# Patient Record
Sex: Male | Born: 1965 | Race: White | Hispanic: No | Smoking: Current every day smoker
Health system: Southern US, Community
[De-identification: ages and names within clinical notes are randomized; demographics above are authoritative.]

## PROBLEM LIST (undated history)

## (undated) DIAGNOSIS — I2510 Atherosclerotic heart disease of native coronary artery without angina pectoris: Secondary | ICD-10-CM

## (undated) DIAGNOSIS — M1990 Unspecified osteoarthritis, unspecified site: Secondary | ICD-10-CM

## (undated) DIAGNOSIS — F411 Generalized anxiety disorder: Secondary | ICD-10-CM

## (undated) DIAGNOSIS — N529 Male erectile dysfunction, unspecified: Secondary | ICD-10-CM

## (undated) DIAGNOSIS — R1013 Epigastric pain: Secondary | ICD-10-CM

## (undated) DIAGNOSIS — J209 Acute bronchitis, unspecified: Secondary | ICD-10-CM

## (undated) DIAGNOSIS — K219 Gastro-esophageal reflux disease without esophagitis: Secondary | ICD-10-CM

## (undated) DIAGNOSIS — E785 Hyperlipidemia, unspecified: Secondary | ICD-10-CM

## (undated) HISTORY — DX: Gastro-esophageal reflux disease without esophagitis: K21.9

## (undated) HISTORY — DX: Generalized anxiety disorder: F41.1

## (undated) HISTORY — DX: Hyperlipidemia, unspecified: E78.5

## (undated) HISTORY — DX: Unspecified osteoarthritis, unspecified site: M19.90

## (undated) HISTORY — DX: Epigastric pain: R10.13

## (undated) HISTORY — DX: Male erectile dysfunction, unspecified: N52.9

## (undated) HISTORY — DX: Acute bronchitis, unspecified: J20.9

## (undated) HISTORY — PX: COLONOSCOPY: SHX174

---

## 1987-01-02 HISTORY — PX: WISDOM TOOTH EXTRACTION: SHX21

## 1998-09-09 ENCOUNTER — Encounter: Payer: Self-pay | Admitting: Emergency Medicine

## 1998-09-09 ENCOUNTER — Emergency Department (HOSPITAL_COMMUNITY): Admission: EM | Admit: 1998-09-09 | Discharge: 1998-09-09 | Payer: Self-pay | Admitting: Emergency Medicine

## 1999-06-21 ENCOUNTER — Emergency Department (HOSPITAL_COMMUNITY): Admission: EM | Admit: 1999-06-21 | Discharge: 1999-06-21 | Payer: Self-pay

## 1999-11-11 ENCOUNTER — Emergency Department (HOSPITAL_COMMUNITY): Admission: EM | Admit: 1999-11-11 | Discharge: 1999-11-11 | Payer: Self-pay | Admitting: *Deleted

## 2000-06-19 ENCOUNTER — Emergency Department (HOSPITAL_COMMUNITY): Admission: EM | Admit: 2000-06-19 | Discharge: 2000-06-19 | Payer: Self-pay | Admitting: Emergency Medicine

## 2000-07-19 ENCOUNTER — Other Ambulatory Visit: Admission: RE | Admit: 2000-07-19 | Discharge: 2000-07-19 | Payer: Self-pay | Admitting: *Deleted

## 2000-09-13 ENCOUNTER — Emergency Department (HOSPITAL_COMMUNITY): Admission: EM | Admit: 2000-09-13 | Discharge: 2000-09-13 | Payer: Self-pay | Admitting: Emergency Medicine

## 2000-09-13 ENCOUNTER — Encounter: Payer: Self-pay | Admitting: Emergency Medicine

## 2005-03-29 ENCOUNTER — Emergency Department (HOSPITAL_COMMUNITY): Admission: EM | Admit: 2005-03-29 | Discharge: 2005-03-30 | Payer: Self-pay | Admitting: Emergency Medicine

## 2005-04-13 ENCOUNTER — Ambulatory Visit: Payer: Self-pay

## 2005-04-13 ENCOUNTER — Encounter: Payer: Self-pay | Admitting: Internal Medicine

## 2005-12-27 ENCOUNTER — Encounter: Admission: RE | Admit: 2005-12-27 | Discharge: 2005-12-27 | Payer: Self-pay | Admitting: Occupational Medicine

## 2008-11-10 ENCOUNTER — Ambulatory Visit: Payer: Self-pay | Admitting: Family Medicine

## 2008-11-10 DIAGNOSIS — F411 Generalized anxiety disorder: Secondary | ICD-10-CM

## 2008-11-10 DIAGNOSIS — N529 Male erectile dysfunction, unspecified: Secondary | ICD-10-CM

## 2008-11-10 HISTORY — DX: Male erectile dysfunction, unspecified: N52.9

## 2008-11-10 HISTORY — DX: Generalized anxiety disorder: F41.1

## 2008-12-21 ENCOUNTER — Ambulatory Visit: Payer: Self-pay | Admitting: Family Medicine

## 2008-12-21 LAB — CONVERTED CEMR LAB
Bilirubin Urine: NEGATIVE
Blood in Urine, dipstick: NEGATIVE
Glucose, Urine, Semiquant: NEGATIVE
Ketones, urine, test strip: NEGATIVE
Nitrite: NEGATIVE
Protein, U semiquant: NEGATIVE
Specific Gravity, Urine: 1.02
Urobilinogen, UA: 0.2
WBC Urine, dipstick: NEGATIVE
pH: 7

## 2008-12-22 LAB — CONVERTED CEMR LAB
ALT: 37 units/L (ref 0–53)
AST: 30 units/L (ref 0–37)
Albumin: 4.1 g/dL (ref 3.5–5.2)
Alkaline Phosphatase: 61 units/L (ref 39–117)
BUN: 15 mg/dL (ref 6–23)
Basophils Absolute: 0 10*3/uL (ref 0.0–0.1)
Basophils Relative: 0.7 % (ref 0.0–3.0)
Bilirubin, Direct: 0 mg/dL (ref 0.0–0.3)
CO2: 31 meq/L (ref 19–32)
Calcium: 9.4 mg/dL (ref 8.4–10.5)
Chloride: 104 meq/L (ref 96–112)
Cholesterol: 207 mg/dL — ABNORMAL HIGH (ref 0–200)
Creatinine, Ser: 1.2 mg/dL (ref 0.4–1.5)
Direct LDL: 157.6 mg/dL
Eosinophils Absolute: 0.3 10*3/uL (ref 0.0–0.7)
Eosinophils Relative: 5.8 % — ABNORMAL HIGH (ref 0.0–5.0)
GFR calc non Af Amer: 69.72 mL/min (ref >60)
Glucose, Bld: 101 mg/dL — ABNORMAL HIGH (ref 70–99)
HCT: 45.3 % (ref 39.0–52.0)
HDL: 39.4 mg/dL (ref >39.00)
Hemoglobin: 15.2 g/dL (ref 13.0–17.0)
Lymphocytes Relative: 30.7 % (ref 12.0–46.0)
Lymphs Abs: 1.8 10*3/uL (ref 0.7–4.0)
MCHC: 33.6 g/dL (ref 30.0–36.0)
MCV: 90.7 fL (ref 78.0–100.0)
Monocytes Absolute: 0.4 10*3/uL (ref 0.1–1.0)
Monocytes Relative: 7.3 % (ref 3.0–12.0)
Neutro Abs: 3.5 10*3/uL (ref 1.4–7.7)
Neutrophils Relative %: 55.5 % (ref 43.0–77.0)
Platelets: 221 10*3/uL (ref 150.0–400.0)
Potassium: 4.6 meq/L (ref 3.5–5.1)
RBC: 4.99 M/uL (ref 4.22–5.81)
RDW: 11.9 % (ref 11.5–14.6)
Sodium: 142 meq/L (ref 135–145)
TSH: 1.65 microintl units/mL (ref 0.35–5.50)
Total Bilirubin: 0.7 mg/dL (ref 0.3–1.2)
Total CHOL/HDL Ratio: 5
Total Protein: 6.7 g/dL (ref 6.0–8.3)
Triglycerides: 85 mg/dL (ref 0.0–149.0)
VLDL: 17 mg/dL (ref 0.0–40.0)
WBC: 6 10*3/uL (ref 4.5–10.5)

## 2008-12-29 ENCOUNTER — Ambulatory Visit: Payer: Self-pay | Admitting: Family Medicine

## 2008-12-29 DIAGNOSIS — E785 Hyperlipidemia, unspecified: Secondary | ICD-10-CM

## 2008-12-29 HISTORY — DX: Hyperlipidemia, unspecified: E78.5

## 2009-03-31 ENCOUNTER — Ambulatory Visit: Payer: Self-pay | Admitting: Family Medicine

## 2009-03-31 DIAGNOSIS — J209 Acute bronchitis, unspecified: Secondary | ICD-10-CM | POA: Insufficient documentation

## 2009-03-31 HISTORY — DX: Acute bronchitis, unspecified: J20.9

## 2009-08-03 ENCOUNTER — Ambulatory Visit: Payer: Self-pay | Admitting: Family Medicine

## 2009-12-08 ENCOUNTER — Ambulatory Visit: Payer: Self-pay | Admitting: Family Medicine

## 2009-12-08 DIAGNOSIS — R1013 Epigastric pain: Secondary | ICD-10-CM | POA: Insufficient documentation

## 2009-12-08 HISTORY — DX: Epigastric pain: R10.13

## 2009-12-08 LAB — CONVERTED CEMR LAB
Cholesterol, target level: 200 mg/dL
HDL goal, serum: 40 mg/dL
LDL Goal: 130 mg/dL

## 2009-12-09 LAB — CONVERTED CEMR LAB
ALT: 35 units/L (ref 0–53)
AST: 27 units/L (ref 0–37)
Albumin: 4.5 g/dL (ref 3.5–5.2)
Alkaline Phosphatase: 70 units/L (ref 39–117)
Bilirubin, Direct: 0.1 mg/dL (ref 0.0–0.3)
Cholesterol: 176 mg/dL (ref 0–200)
HDL: 36.1 mg/dL — ABNORMAL LOW (ref >39.00)
LDL Cholesterol: 119 mg/dL — ABNORMAL HIGH (ref 0–99)
Total Bilirubin: 0.4 mg/dL (ref 0.3–1.2)
Total CHOL/HDL Ratio: 5
Total Protein: 7.5 g/dL (ref 6.0–8.3)
Triglycerides: 103 mg/dL (ref 0.0–149.0)
VLDL: 20.6 mg/dL (ref 0.0–40.0)

## 2010-02-02 NOTE — Assessment & Plan Note (Signed)
Summary: COUGH//CCM   Vital Signs:  Patient profile:   45 year old male Temp:     98.2 degrees F oral BP sitting:   100 / 80  (left arm) Cuff size:   regular  Vitals Entered By: Sid Falcon LPN (March 31, 2009 11:48 AM) CC: ongoing cough X 3 weeks   History of Present Illness: patient seen with cough for approximately one month. Initially seen through employer. Started off with postnasal drip symptoms and subsequent productive cough. Treated with 10 day course of amoxicillin without improvement. Also tried Claritin. Occasional sore throat. Denies fever. No hemoptysis or dyspnea. No appetite or weight changes. Cough especially bothersome at night and not relieved with over-the-counter medication. Smokes one half pack cigarettes per day. Continued productive cough with green to brown sputum  Preventive Screening-Counseling & Management  Alcohol-Tobacco     Smoking Status: current     Packs/Day: 0.5  Allergies (verified): No Known Drug Allergies  Past History:  Past Medical History: Last updated: 11/10/2008 Chicken pox Anxiety  Social History: Packs/Day:  0.5  Review of Systems      See HPI  Physical Exam  General:  Well-developed,well-nourished,in no acute distress; alert,appropriate and cooperative throughout examination Ears:  External ear exam shows no significant lesions or deformities.  Otoscopic examination reveals clear canals, tympanic membranes are intact bilaterally without bulging, retraction, inflammation or discharge. Hearing is grossly normal bilaterally. Mouth:  Oral mucosa and oropharynx without lesions or exudates.  Teeth in good repair. Neck:  No deformities, masses, or tenderness noted. Lungs:  Normal respiratory effort, chest expands symmetrically. Lungs are clear to auscultation, no crackles or wheezes. Heart:  Normal rate and regular rhythm. S1 and S2 normal without gallop, murmur, click, rub or other extra sounds.   Impression &  Recommendations:  Problem # 1:  ACUTE BRONCHITIS (ICD-466.0) Samples Avelox 400 mg by mouth once daily for 7 days and cough suppressant for as needed use.  CXR if no better 1-2 weeks. Pt encouraged to stop smoking. His updated medication list for this problem includes:    Tussionex Pennkinetic Er 8-10 Mg/60ml Lqcr (Chlorpheniramine-hydrocodone) ..... One tsp by mouth q 12 hours as needed cough  Complete Medication List: 1)  Klonopin 1 Mg Tabs (Clonazepam) .... One tab two times a day 2)  Cialis 20 Mg Tabs (Tadalafil) .... As needed 3)  Simvastatin 40 Mg Tabs (Simvastatin) .... One by mouth once daily 4)  Tussionex Pennkinetic Er 8-10 Mg/76ml Lqcr (Chlorpheniramine-hydrocodone) .... One tsp by mouth q 12 hours as needed cough  Patient Instructions: 1)  start Avelox 400 mg one tablet daily. 2)  Followup within one to 2 weeks if cough not improving Prescriptions: TUSSIONEX PENNKINETIC ER 8-10 MG/5ML LQCR (CHLORPHENIRAMINE-HYDROCODONE) one tsp by mouth q 12 hours as needed cough  #90 ml x 0   Entered and Authorized by:   Evelena Peat MD   Signed by:   Evelena Peat MD on 03/31/2009   Method used:   Print then Give to Patient   RxID:   [id number]

## 2010-02-02 NOTE — Assessment & Plan Note (Signed)
Summary: NEW PT TO LBF/TO ESTABLISH/CJR   Vital Signs:  Patient profile:   45 year old male Height:      72 inches Weight:      196 pounds BMI:     26.68 Temp:     98.5 degrees F oral Pulse rate:   80 / minute Pulse rhythm:   regular Resp:     12 per minute BP sitting:   120 / 80  (left arm) Cuff size:   regular  Vitals Entered By: Sid Falcon LPN (November 10, 2008 1:14 PM) CC: New pt to establish   History of Present Illness: Patient seen to establish care. His history of anxiety disorder and probable panic attacks. This has been controlled with Klonopin 1 mg b.i.d. Question of previous intolerance to serotonin medications. He also takes Cialis for erectile dysfunction. No other chronic medical problems. No prior surgeries. No known allergies.  Family history significant for father having heart disease at age 6 and father with prior stroke. Mother had some sort of brain cancer.  Patient smokes less than one pack of cigarettes per day and desires to try to quit this year. No regular alcohol use.  Preventive Screening-Counseling & Management  Alcohol-Tobacco     Smoking Status: current     Packs/Day: 0.75  Caffeine-Diet-Exercise     Does Patient Exercise: yes  Allergies (verified): No Known Drug Allergies  Past History:  Family History: Last updated: 11/10/2008 Family History CAD  Fa 90 Family History of Stroke  Family History Diabetes  Brain cancer, mother  Social History: Last updated: 11/10/2008 Occupation:  Facilities Maintenance Tech Single Current Smoker Alcohol use-yes Regular exercise-yes  Risk Factors: Exercise: yes (11/10/2008)  Risk Factors: Smoking Status: current (11/10/2008) Packs/Day: 0.75 (11/10/2008)  Past Medical History: Chicken pox Anxiety  Family History: Family History CAD  Fa 68 Family History of Stroke  Family History Diabetes  Brain cancer, mother  Social History: Occupation:  Civil engineer, contracting Single Current Smoker Alcohol use-yes Regular exercise-yes Smoking Status:  current Packs/Day:  0.75 Occupation:  employed Does Patient Exercise:  yes  Review of Systems  The patient denies anorexia, fever, weight loss, weight gain, chest pain, syncope, dyspnea on exertion, peripheral edema, prolonged cough, hemoptysis, and abdominal pain.    Physical Exam  General:  Well-developed,well-nourished,in no acute distress; alert,appropriate and cooperative throughout examination Mouth:  Oral mucosa and oropharynx without lesions or exudates.  Teeth in good repair. Neck:  No deformities, masses, or tenderness noted. Lungs:  Normal respiratory effort, chest expands symmetrically. Lungs are clear to auscultation, no crackles or wheezes. Heart:  Normal rate and regular rhythm. S1 and S2 normal without gallop, murmur, click, rub or other extra sounds.   Impression & Recommendations:  Problem # 1:  ANXIETY (ICD-300.00) Possible history of panic disorder. Refill Klonopin for 6 months. His updated medication list for this problem includes:    Klonopin 1 Mg Tabs (Clonazepam) ..... One tab two times a day  Problem # 2:  IMPOTENCE OF ORGANIC ORIGIN (ICD-607.84) refill Cialis for one year His updated medication list for this problem includes:    Cialis 20 Mg Tabs (Tadalafil) .Marland Kitchen... As needed  Complete Medication List: 1)  Klonopin 1 Mg Tabs (Clonazepam) .... One tab two times a day 2)  Cialis 20 Mg Tabs (Tadalafil) .... As needed  Patient Instructions: 1)  Schedule complete physical at your convenience. Prescriptions: KLONOPIN 1 MG TABS (CLONAZEPAM) one tab two times a day  #60 x 5  Entered and Authorized by:   Evelena Peat MD   Signed by:   Evelena Peat MD on 11/10/2008   Method used:   Print then Give to Patient   RxID:   [id number] CIALIS 20 MG TABS (TADALAFIL) as needed  #7 x 11   Entered and Authorized by:   Evelena Peat MD   Signed by:   Evelena Peat MD on  11/10/2008   Method used:   Print then Give to Patient   RxID:   [id number]   Preventive Care Screening  Last Tetanus Booster:    Date:  10/01/2008    Results:  Historical

## 2010-02-02 NOTE — Assessment & Plan Note (Signed)
Summary:     Vital Signs:  Patient profile:   45 year old male Height:      721 inches Weight:      196 pounds Temp:     98.6 degrees F oral Pulse rate:   80 / minute Pulse rhythm:   regular Resp:     12 per minute BP sitting:   130 / 84  (left arm) Cuff size:   regular  Vitals Entered By: Sid Falcon LPN (December 29, 2008 2:56 PM) CC: CPX, labs printed   History of Present Illness: Patient here for complete physical examination. History of hyperlipidemia which is currently untreated. Has previously been on statin therapy but for some reason was stopped. This was not due to adverse effect from medication. Patient has erectile dysfunction on Cialis. Also takes Klonopin for chronic anxiety.  Family history significant for father having coronary disease age 3. Patient's other risk factors include history of smoking and hyperlipidemia.  Tetanus is up-to-date.  Allergies (verified): No Known Drug Allergies  Past History:  Past Medical History: Last updated: 11/10/2008 Chicken pox Anxiety  Family History: Last updated: 11/10/2008 Family History CAD  Fa 8 Family History of Stroke  Family History Diabetes  Brain cancer, mother  Social History: Last updated: 11/10/2008 Occupation:  Facilities Maintenance Tech Single Current Smoker Alcohol use-yes Regular exercise-yes  Risk Factors: Exercise: yes (11/10/2008)  Risk Factors: Smoking Status: current (11/10/2008) Packs/Day: 0.75 (11/10/2008)  Review of Systems  The patient denies anorexia, fever, weight loss, weight gain, vision loss, decreased hearing, hoarseness, chest pain, syncope, dyspnea on exertion, peripheral edema, prolonged cough, headaches, hemoptysis, abdominal pain, melena, hematochezia, severe indigestion/heartburn, hematuria, incontinence, genital sores, muscle weakness, suspicious skin lesions, transient blindness, difficulty walking, depression, unusual weight change, abnormal bleeding, enlarged  lymph nodes, and testicular masses.    Physical Exam  General:  Well-developed,well-nourished,in no acute distress; alert,appropriate and cooperative throughout examination Head:  Normocephalic and atraumatic without obvious abnormalities. No apparent alopecia or balding. Eyes:  No corneal or conjunctival inflammation noted. EOMI. Perrla. Funduscopic exam benign, without hemorrhages, exudates or papilledema. Vision grossly normal. Ears:  External ear exam shows no significant lesions or deformities.  Otoscopic examination reveals clear canals, tympanic membranes are intact bilaterally without bulging, retraction, inflammation or discharge. Hearing is grossly normal bilaterally. Mouth:  Oral mucosa and oropharynx without lesions or exudates.  Teeth in good repair. Neck:  No deformities, masses, or tenderness noted. Lungs:  Normal respiratory effort, chest expands symmetrically. Lungs are clear to auscultation, no crackles or wheezes. Heart:  Normal rate and regular rhythm. S1 and S2 normal without gallop, murmur, click, rub or other extra sounds. Abdomen:  Bowel sounds positive,abdomen soft and non-tender without masses, organomegaly or hernias noted. Msk:  No deformity or scoliosis noted of thoracic or lumbar spine.   Extremities:  No clubbing, cyanosis, edema, or deformity noted with normal full range of motion of all joints.   Neurologic:  No cranial nerve deficits noted. Station and gait are normal. Plantar reflexes are down-going bilaterally. DTRs are symmetrical throughout. Sensory, motor and coordinative functions appear intact. Skin:  Intact without suspicious lesions or rashes Cervical Nodes:  No lymphadenopathy noted Psych:  Cognition and judgment appear intact. Alert and cooperative with normal attention span and concentration. No apparent delusions, illusions, hallucinations   Impression & Recommendations:  Problem # 1:  Preventive Health Care (ICD-V70.0) labs reviewed. Strongly  advise starting back simvastatin. Quit smoking. Continue aspirin use daily.  Problem # 2:  DYSLIPIDEMIA (ICD-272.4)  His updated medication list for this problem includes:    Simvastatin 40 Mg Tabs (Simvastatin) ..... One by mouth once daily  Complete Medication List: 1)  Klonopin 1 Mg Tabs (Clonazepam) .... One tab two times a day 2)  Cialis 20 Mg Tabs (Tadalafil) .... As needed 3)  Simvastatin 40 Mg Tabs (Simvastatin) .... One by mouth once daily   Patient Instructions: 1)  Continue aspirin one daily. 2)  Please schedule a follow-up appointment in 2 months.  3)  Hepatic Panel prior to visit ICD-9: 272.4 4)  Lipid panel prior to visit ICD-9 : 272.4 5)  Stop smoking tips: Choose a quit date. Cut down before the quit date. Decide what you will do as a substitute when you feel the urge to smoke(gum, toothpick, exercise).  Prescriptions: SIMVASTATIN 40 MG TABS (SIMVASTATIN) one by mouth once daily  #30 x 5   Entered and Authorized by:   Evelena Peat MD   Signed by:   Evelena Peat MD on 12/29/2008   Method used:   Electronically to        [facility] [phone]* (retail)       [address]       [place], [place]  [address]       Ph: [phone] or [phone]       Fax: [phone]   RxID:   [id number]

## 2010-02-02 NOTE — Assessment & Plan Note (Signed)
Summary: med check/refills/cjr   Vital Signs:  Patient profile:   45 year old male Weight:      201 pounds Temp:     98 degrees F oral BP sitting:   118 / 88  (left arm) Cuff size:   large  Vitals Entered By: Sid Falcon LPN (August 03, 2009 4:34 PM) CC: med refills   History of Present Illness: Patient here for med check. His chronic problems include history of hyperlipidemia, erectile dysfunction, and chronic anxiety.  Anxiety symptoms well controlled on Klonopin 1 mg b.i.d. No regular alcohol use. Symptoms well controlled. No depressive symptoms.  Hyperlipidemia treated with simvastatin. No history of CAD. No side effects from medication. Plan to  repeat labs in December with complete physical.  Erectile dysfunction treated with Cialis. Needs refills.  Allergies (verified): No Known Drug Allergies  Past History:  Past Medical History: Chicken pox Anxiety Hyperlipidemia  Review of Systems  The patient denies anorexia, fever, weight loss, weight gain, chest pain, syncope, dyspnea on exertion, peripheral edema, prolonged cough, abdominal pain, melena, hematochezia, and severe indigestion/heartburn.    Physical Exam  General:  Well-developed,well-nourished,in no acute distress; alert,appropriate and cooperative throughout examination Mouth:  Oral mucosa and oropharynx without lesions or exudates.  Teeth in good repair. Neck:  No deformities, masses, or tenderness noted. Lungs:  Normal respiratory effort, chest expands symmetrically. Lungs are clear to auscultation, no crackles or wheezes. Heart:  Normal rate and regular rhythm. S1 and S2 normal without gallop, murmur, click, rub or other extra sounds. Psych:  normally interactive, good eye contact, not anxious appearing, and not depressed appearing.     Impression & Recommendations:  Problem # 1:  DYSLIPIDEMIA (ICD-272.4)  His updated medication list for this problem includes:    Simvastatin 40 Mg Tabs  (Simvastatin) ..... One by mouth once daily  Problem # 2:  ANXIETY (ICD-300.00)  His updated medication list for this problem includes:    Klonopin 1 Mg Tabs (Clonazepam) ..... One tab two times a day  Complete Medication List: 1)  Klonopin 1 Mg Tabs (Clonazepam) .... One tab two times a day 2)  Cialis 20 Mg Tabs (Tadalafil) .... As needed 3)  Simvastatin 40 Mg Tabs (Simvastatin) .... One by mouth once daily 4)  Tussionex Pennkinetic Er 8-10 Mg/36ml Lqcr (Chlorpheniramine-hydrocodone) .... One tsp by mouth q 12 hours as needed cough  Patient Instructions: 1)  Schedule Physical for after 12-29-09. Prescriptions: CIALIS 20 MG TABS (TADALAFIL) as needed  #8 x 11   Entered and Authorized by:   Evelena Peat MD   Signed by:   Evelena Peat MD on 08/03/2009   Method used:   Print then Give to Patient   RxID:   [id number] KLONOPIN 1 MG TABS (CLONAZEPAM) one tab two times a day  #60 x 5   Entered and Authorized by:   Evelena Peat MD   Signed by:   Evelena Peat MD on 08/03/2009   Method used:   Print then Give to Patient   RxID:   [id number]

## 2010-02-02 NOTE — Assessment & Plan Note (Signed)
Summary: GI CONCERNS // RS   Vital Signs:  Patient profile:   45 year old male Weight:      205 pounds Temp:     97.6 degrees F oral BP sitting:   120 / 84  (left arm) Cuff size:   large  Vitals Entered By: Sid Falcon LPN (December 08, 2009 9:59 AM)  History of Present Illness: Patient here for the following.  Three-week history of dull achy pain midepigastric area which comes and goes. Mild to moderate severity. Worse after alcohol consumption which he rarely consumes. He uses daily BC powder. Prilosec over-the-counter with some relief. Denies any melena, hematemesis, dizziness, radiation of pain. No other aggravating factors. No history of peptic ulcer disease.  Hyperlipidemia treated with simvastatin. No side effects from medication. No history of CAD.  History of erectile dysfunction. Treated with Cialis which works well  Lipid Management History:      Positive NCEP/ATP III risk factors include male age 43 years old or older, HDL cholesterol less than 40, family history for ischemic heart disease (males less than 88 years old), and current tobacco user.  Negative NCEP/ATP III risk factors include non-hypertensive, no ASHD (atherosclerotic heart disease), no prior stroke/TIA, no peripheral vascular disease, and no history of aortic aneurysm.     Allergies (verified): No Known Drug Allergies  Past History:  Past Medical History: Last updated: 08/03/2009 Chicken pox Anxiety Hyperlipidemia  Family History: Last updated: 11/10/2008 Family History CAD  Fa 62 Family History of Stroke  Family History Diabetes  Brain cancer, mother  Social History: Last updated: 11/10/2008 Occupation:  Facilities Maintenance Tech Single Current Smoker Alcohol use-yes Regular exercise-yes  Risk Factors: Exercise: yes (11/10/2008)  Risk Factors: Smoking Status: current (03/31/2009) Packs/Day: 0.5 (03/31/2009) PMH-FH-SH reviewed for relevance  Review of Systems  The patient  denies anorexia, fever, weight loss, chest pain, syncope, dyspnea on exertion, peripheral edema, prolonged cough, headaches, hemoptysis, melena, hematochezia, and severe indigestion/heartburn.    Physical Exam  General:  Well-developed,well-nourished,in no acute distress; alert,appropriate and cooperative throughout examination Ears:  External ear exam shows no significant lesions or deformities.  Otoscopic examination reveals clear canals, tympanic membranes are intact bilaterally without bulging, retraction, inflammation or discharge. Hearing is grossly normal bilaterally. Mouth:  Oral mucosa and oropharynx without lesions or exudates.  Teeth in good repair. Neck:  No deformities, masses, or tenderness noted. Lungs:  Normal respiratory effort, chest expands symmetrically. Lungs are clear to auscultation, no crackles or wheezes. Heart:  Normal rate and regular rhythm. S1 and S2 normal without gallop, murmur, click, rub or other extra sounds. Abdomen:  soft, non-tender, no distention, no masses, no guarding, and no rigidity.     Impression & Recommendations:  Problem # 1:  HYPERLIPIDEMIA (ICD-272.4)  His updated medication list for this problem includes:    Simvastatin 40 Mg Tabs (Simvastatin) ..... One by mouth once daily  Orders: Specimen Handling (30865) Venipuncture (78469) TLB-Lipid Panel (80061-LIPID) TLB-Hepatic/Liver Function Pnl (80076-HEPATIC)  Problem # 2:  IMPOTENCE OF ORGANIC ORIGIN (ICD-607.84) trial of Cialis 5mg  one daily with sample pack given. The following medications were removed from the medication list:    Cialis 20 Mg Tabs (Tadalafil) .Marland Kitchen... As needed His updated medication list for this problem includes:    Cialis 5 Mg Tabs (Tadalafil) ..... One by mouth once daily  Problem # 3:  EPIGASTRIC PAIN (ICD-789.06) suspect some gastritis. Discontinue aspirin and reduce alcohol consumption. Short-term use of PPI. Further eval if no better in 2-3  weeks.  Complete  Medication List: 1)  Klonopin 1 Mg Tabs (Clonazepam) .... One tab two times a day 2)  Simvastatin 40 Mg Tabs (Simvastatin) .... One by mouth once daily 3)  Pantoprazole Sodium 40 Mg Tbec (Pantoprazole sodium) .... One by mouth once daily 4)  Cialis 5 Mg Tabs (Tadalafil) .... One by mouth once daily  Lipid Assessment/Plan:      Based on NCEP/ATP III, the patient's risk factor category is "2 or more risk factors and a calculated 10 year CAD risk of > 20%".  The patient's lipid goals are as follows: Total cholesterol goal is 200; LDL cholesterol goal is 130; HDL cholesterol goal is 40; Triglyceride goal is 150.    Patient Instructions: 1)  Avoid aspirin use for the next couple of weeks 2)  Avoid alcohol use Prescriptions: CIALIS 5 MG TABS (TADALAFIL) one by mouth once daily  #30 x 5   Entered and Authorized by:   Evelena Peat MD   Signed by:   Evelena Peat MD on 12/08/2009   Method used:   [facility] to        [facility] [phone][facility] (retail)       [address]       [place], [place]  [address]       Ph: [phone] or [phone]       Fax: [phone]   RxID:   [id number] PANTOPRAZOLE SODIUM 40 MG TBEC (PANTOPRAZOLE SODIUM) one by mouth once daily  #30 x 1   Entered and Authorized by:   Evelena Peat MD   Signed by:   Evelena Peat MD on 12/08/2009   Method used:   [facility] to        [facility] [phone][facility] (retail)       [address]       [place], [place]  [address]       Ph: [phone] or [phone]       Fax: [phone]   RxID:   [id number]    Orders Added: 1)  Est. Patient Level IV [42706] 2)  Specimen Handling [99000] 3)  Venipuncture [23762] 4)  TLB-Lipid Panel [80061-LIPID] 5)  TLB-Hepatic/Liver Function Pnl [80076-HEPATIC]

## 2010-02-06 ENCOUNTER — Other Ambulatory Visit: Payer: Self-pay | Admitting: *Deleted

## 2010-02-06 DIAGNOSIS — F411 Generalized anxiety disorder: Secondary | ICD-10-CM

## 2010-02-06 MED ORDER — CLONAZEPAM 1 MG PO TABS: 1.0000 mg | ORAL_TABLET | Freq: Two times a day (BID) | ORAL | Status: DC | PRN

## 2010-02-06 NOTE — Telephone Encounter (Signed)
Addended by: Sid Falcon on: 02/06/2010 06:05 PM   Modules accepted: Orders

## 2010-02-06 NOTE — Telephone Encounter (Signed)
OK to refill for 6 months 

## 2010-02-06 NOTE — Telephone Encounter (Signed)
Refill request for Clonazepam 1mg , bid

## 2010-02-09 ENCOUNTER — Other Ambulatory Visit: Payer: Self-pay | Admitting: Family Medicine

## 2010-02-09 DIAGNOSIS — R1013 Epigastric pain: Secondary | ICD-10-CM

## 2010-08-02 ENCOUNTER — Other Ambulatory Visit: Payer: Self-pay | Admitting: *Deleted

## 2010-08-02 DIAGNOSIS — F411 Generalized anxiety disorder: Secondary | ICD-10-CM

## 2010-08-02 MED ORDER — CLONAZEPAM 1 MG PO TABS: 1.0000 mg | ORAL_TABLET | Freq: Two times a day (BID) | ORAL | Status: DC | PRN

## 2010-08-02 NOTE — Telephone Encounter (Signed)
Last filled 02-06-10 #60 with 5 refills

## 2010-08-02 NOTE — Telephone Encounter (Signed)
Clonazepam 1 mg, take BID, #60 with 5 refills last written 02/06/10 refill request

## 2010-08-02 NOTE — Telephone Encounter (Signed)
Clonazepam 1mg  one tab bid refill request

## 2010-08-02 NOTE — Telephone Encounter (Signed)
Refill for 3 months. 

## 2010-08-30 ENCOUNTER — Other Ambulatory Visit: Payer: Self-pay | Admitting: Family Medicine

## 2010-10-10 ENCOUNTER — Other Ambulatory Visit: Payer: Self-pay | Admitting: Family Medicine

## 2010-10-11 ENCOUNTER — Encounter: Payer: Self-pay | Admitting: Family Medicine

## 2010-10-12 ENCOUNTER — Encounter: Payer: Self-pay | Admitting: Family Medicine

## 2010-10-12 ENCOUNTER — Ambulatory Visit (INDEPENDENT_AMBULATORY_CARE_PROVIDER_SITE_OTHER): Payer: Self-pay | Admitting: Family Medicine

## 2010-10-12 DIAGNOSIS — E785 Hyperlipidemia, unspecified: Secondary | ICD-10-CM

## 2010-10-12 DIAGNOSIS — K219 Gastro-esophageal reflux disease without esophagitis: Secondary | ICD-10-CM

## 2010-10-12 DIAGNOSIS — N529 Male erectile dysfunction, unspecified: Secondary | ICD-10-CM

## 2010-10-12 DIAGNOSIS — F411 Generalized anxiety disorder: Secondary | ICD-10-CM

## 2010-10-12 MED ORDER — TADALAFIL 5 MG PO TABS: 5.0000 mg | ORAL_TABLET | Freq: Every day | ORAL | Status: DC

## 2010-10-12 MED ORDER — CLONAZEPAM 1 MG PO TABS: 1.0000 mg | ORAL_TABLET | Freq: Two times a day (BID) | ORAL | Status: DC | PRN

## 2010-10-12 NOTE — Progress Notes (Signed)
  Subjective:    Patient ID: [name] [name], male    DOB: 02/27/1965, 45 y.o.   MRN: [id number]  HPI Patient seen for medical followup. He has history of chronic anxiety, GERD, hyperlipidemia, and erectile dysfunction. Requesting refills of Klonopin which has taken for several years and Cialis. No history of BPH. Reflux well controlled. Hyperlipidemia on simvastatin 40 mg daily. Plans to schedule physical soon. No myalgias. Anxiety symptoms well controlled. Denies recent depression. No alcohol use.  Patient denies any recent GI symptoms. No recent stool changes. Good appetite no weight change.  Past Medical History  Diagnosis Date  . Acute bronchitis 03/31/2009  . ANXIETY 11/10/2008  . EPIGASTRIC PAIN 12/08/2009  . 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 Other and unspecified hyperlipidemia 12/29/2008   No past surgical history on file.  reports that he has been smoking Cigarettes.  He has a 8 pack-year smoking history. He does not have any smokeless tobacco history on file. His alcohol and drug histories not on file. family history includes Cancer in his mother and Heart disease in his father.  There is no history of Diabetes and Stroke. No Known Allergies    Review of Systems  Constitutional: Negative for fever, chills, fatigue and unexpected weight change.  HENT: Negative for trouble swallowing and voice change.   Respiratory: Negative for cough.   Cardiovascular: Negative for chest pain, palpitations and leg swelling.  Gastrointestinal: Negative for abdominal pain.  Neurological: Negative for headaches.  Psychiatric/Behavioral: Negative for dysphoric mood.       Objective:   Physical Exam  Constitutional: He is oriented to person, place, and time. He appears well-developed and well-nourished.  HENT:  Mouth/Throat: Oropharynx is clear and moist.  Neck: Neck supple.  Cardiovascular: Normal rate, regular rhythm and normal heart sounds.   Pulmonary/Chest: Effort normal and  breath sounds normal. No respiratory distress. He has no wheezes. He has no rales.  Lymphadenopathy:    He has no cervical adenopathy.  Neurological: He is alert and oriented to person, place, and time.  Psychiatric: He has a normal mood and affect. His behavior is normal.          Assessment & Plan:  #1 chronic anxiety. Stable. Refill Klonopin for 6 months #2 GERD stable. Continue protonix 40 mg daily  #3 hyperlipidemia. Check lipids and hepatic panel at upcoming physical soon  #4 erectile dysfunction. Provided samples and prescription Cialis 5 mg daily

## 2010-10-26 ENCOUNTER — Other Ambulatory Visit (INDEPENDENT_AMBULATORY_CARE_PROVIDER_SITE_OTHER): Payer: Self-pay

## 2010-10-26 DIAGNOSIS — Z0000 Encounter for general adult medical examination without abnormal findings: Secondary | ICD-10-CM

## 2010-10-26 LAB — CBC WITH DIFFERENTIAL/PLATELET
Basophils Absolute: 0 10*3/uL (ref 0.0–0.1)
Basophils Relative: 0.6 % (ref 0.0–3.0)
Eosinophils Absolute: 0.3 10*3/uL (ref 0.0–0.7)
Eosinophils Relative: 4.3 % (ref 0.0–5.0)
HCT: 42.9 % (ref 39.0–52.0)
Hemoglobin: 14.8 g/dL (ref 13.0–17.0)
Lymphocytes Relative: 27 % (ref 12.0–46.0)
Lymphs Abs: 1.9 10*3/uL (ref 0.7–4.0)
MCHC: 34.4 g/dL (ref 30.0–36.0)
MCV: 89.9 fl (ref 78.0–100.0)
Monocytes Absolute: 0.4 10*3/uL (ref 0.1–1.0)
Monocytes Relative: 5.7 % (ref 3.0–12.0)
Neutro Abs: 4.3 10*3/uL (ref 1.4–7.7)
Neutrophils Relative %: 62.4 % (ref 43.0–77.0)
Platelets: 222 10*3/uL (ref 150.0–400.0)
RBC: 4.77 Mil/uL (ref 4.22–5.81)
RDW: 12.8 % (ref 11.5–14.6)
WBC: 6.9 10*3/uL (ref 4.5–10.5)

## 2010-10-26 LAB — POCT URINALYSIS DIPSTICK
Bilirubin, UA: NEGATIVE
Blood, UA: NEGATIVE
Glucose, UA: NEGATIVE
Ketones, UA: NEGATIVE
Leukocytes, UA: NEGATIVE
Nitrite, UA: NEGATIVE
Protein, UA: NEGATIVE
Spec Grav, UA: 1.015
Urobilinogen, UA: 0.2
pH, UA: 7.5

## 2010-10-26 LAB — BASIC METABOLIC PANEL
BUN: 15 mg/dL (ref 6–23)
CO2: 26 mEq/L (ref 19–32)
Calcium: 9.1 mg/dL (ref 8.4–10.5)
Chloride: 105 mEq/L (ref 96–112)
Creatinine, Ser: 1.2 mg/dL (ref 0.4–1.5)
GFR: 71.2 mL/min (ref >60.00)
Glucose, Bld: 95 mg/dL (ref 70–99)
Potassium: 4.1 mEq/L (ref 3.5–5.1)
Sodium: 140 mEq/L (ref 135–145)

## 2010-10-26 LAB — HEPATIC FUNCTION PANEL
ALT: 24 U/L (ref 0–53)
AST: 22 U/L (ref 0–37)
Albumin: 4.3 g/dL (ref 3.5–5.2)
Alkaline Phosphatase: 53 U/L (ref 39–117)
Bilirubin, Direct: 0 mg/dL (ref 0.0–0.3)
Total Bilirubin: 0.6 mg/dL (ref 0.3–1.2)
Total Protein: 7.3 g/dL (ref 6.0–8.3)

## 2010-10-26 LAB — LIPID PANEL
Cholesterol: 152 mg/dL (ref 0–200)
HDL: 40.8 mg/dL (ref >39.00)
LDL Cholesterol: 97 mg/dL (ref 0–99)
Total CHOL/HDL Ratio: 4
Triglycerides: 72 mg/dL (ref 0.0–149.0)
VLDL: 14.4 mg/dL (ref 0.0–40.0)

## 2010-10-26 LAB — TSH: TSH: 1.38 u[IU]/mL (ref 0.35–5.50)

## 2010-10-30 ENCOUNTER — Encounter: Payer: Self-pay | Admitting: Family Medicine

## 2010-10-30 ENCOUNTER — Ambulatory Visit (INDEPENDENT_AMBULATORY_CARE_PROVIDER_SITE_OTHER): Payer: Self-pay | Admitting: Family Medicine

## 2010-10-30 VITALS — BP 130/88 | HR 83 | Temp 98.4°F | Wt 209.0 lb

## 2010-10-30 DIAGNOSIS — M542 Cervicalgia: Secondary | ICD-10-CM

## 2010-10-30 DIAGNOSIS — R1030 Lower abdominal pain, unspecified: Secondary | ICD-10-CM

## 2010-10-30 DIAGNOSIS — R109 Unspecified abdominal pain: Secondary | ICD-10-CM

## 2010-10-30 DIAGNOSIS — N419 Inflammatory disease of prostate, unspecified: Secondary | ICD-10-CM

## 2010-10-30 LAB — POCT URINALYSIS DIPSTICK
Blood, UA: NEGATIVE
Glucose, UA: NEGATIVE
Ketones, UA: NEGATIVE
Leukocytes, UA: NEGATIVE
Nitrite, UA: NEGATIVE
Protein, UA: NEGATIVE
Spec Grav, UA: 1.02
Urobilinogen, UA: 0.2
pH, UA: 6.5

## 2010-10-30 MED ORDER — CIPROFLOXACIN HCL 500 MG PO TABS: 500.0000 mg | ORAL_TABLET | Freq: Two times a day (BID) | ORAL | Status: DC
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
## 2010-11-08 ENCOUNTER — Ambulatory Visit (INDEPENDENT_AMBULATORY_CARE_PROVIDER_SITE_OTHER): Payer: Self-pay | Admitting: Family Medicine

## 2010-11-08 ENCOUNTER — Encounter: Payer: Self-pay | Admitting: Family Medicine

## 2010-11-08 VITALS — BP 116/80 | HR 80 | Temp 98.0°F | Resp 12 | Ht 71.5 in | Wt 204.0 lb

## 2010-11-08 DIAGNOSIS — Z0000 Encounter for general adult medical examination without abnormal findings: Secondary | ICD-10-CM

## 2010-11-08 MED ORDER — CIPROFLOXACIN HCL 500 MG PO TABS: 500.0000 mg | ORAL_TABLET | Freq: Two times a day (BID) | ORAL | Status: AC

## 2010-11-08 NOTE — Patient Instructions (Signed)
Try to quit smoking.  Let me know if I can help in any way.

## 2010-11-08 NOTE — Progress Notes (Signed)
Subjective:    Patient ID: [name] [name], male    DOB: 05/02/1965, 45 y.o.   MRN: [id number]  HPI  Patient seen for complete physical exam. Past medical history reviewed. He has history of chronic anxiety, erectile dysfunction, GERD. Symptoms stable. Medications reviewed and no recent changes. Good compliance. Recent possible prostatitis. Patient states about 70% improved after starting Cipro. No burning with urination. No fevers or chills. Requesting refill of antibiotic. No prior history of known prostatitis. No obstructive urinary symptoms.  Family history of father with coronary disease around age 11. Patient still smokes. No history of chest pain. Lipids have been fairly well controlled. Low HDL history. Tetanus 2010.  Past Medical History  Diagnosis Date  . Acute bronchitis 03/31/2009  . ANXIETY 11/10/2008  . EPIGASTRIC PAIN 12/08/2009  . 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 Other and unspecified hyperlipidemia 12/29/2008   No past surgical history on file.  reports that he has been smoking Cigarettes.  He has a 8 pack-year smoking history. He has never used smokeless tobacco. He reports that he drinks alcohol. He reports that he does not use illicit drugs. family history includes Cancer in his mother; Heart disease in his paternal aunt and paternal uncle; and Heart disease (age of onset:36) in his father.  There is no history of Diabetes and Stroke. No Known Allergies    Review of Systems  Constitutional: Negative for fever, activity change, appetite change and fatigue.  HENT: Negative for ear pain, congestion and trouble swallowing.   Eyes: Negative for pain and visual disturbance.  Respiratory: Negative for cough, shortness of breath and wheezing.   Cardiovascular: Negative for chest pain and palpitations.  Gastrointestinal: Negative for nausea, vomiting, abdominal pain, diarrhea, constipation, blood in stool, abdominal distention and rectal pain.  Genitourinary: Negative  for dysuria, frequency, hematuria and testicular pain.  Musculoskeletal: Negative for joint swelling and arthralgias.  Skin: Negative for rash.  Neurological: Negative for dizziness, syncope and headaches.  Hematological: Negative for adenopathy.  Psychiatric/Behavioral: Negative for confusion and dysphoric mood.       Objective:   Physical Exam  Constitutional: He is oriented to person, place, and time. He appears well-developed and well-nourished. No distress.  HENT:  Head: Normocephalic and atraumatic.  Right Ear: External ear normal.  Left Ear: External ear normal.  Mouth/Throat: Oropharynx is clear and moist.  Eyes: Conjunctivae and EOM are normal. Pupils are equal, round, and reactive to light.  Neck: Normal range of motion. Neck supple. No thyromegaly present.  Cardiovascular: Normal rate, regular rhythm and normal heart sounds.   No murmur heard. Pulmonary/Chest: No respiratory distress. He has no wheezes. He has no rales.  Abdominal: Soft. Bowel sounds are normal. He exhibits no distension and no mass. There is no tenderness. There is no rebound and no guarding.  Musculoskeletal: He exhibits no edema.  Lymphadenopathy:    He has no cervical adenopathy.  Neurological: He is alert and oriented to person, place, and time. He displays normal reflexes. No cranial nerve deficit.  Skin: No rash noted.  Psychiatric: He has a normal mood and affect. His behavior is normal.          Assessment & Plan:  #1 health maintenance. Strongly encourage smoking cessation. Patient wishes to quit on his own. Labs reviewed with patient. Consider baby aspirin one daily. Consider omega-3 supplement. Flu vaccine offered but declined  #2 possible acute prostatitis. Improved but not resolved. Refilled Cipro for 10 more days.  He will be  in touch his symptoms not fully resolved after her second course of antibiotic

## 2010-12-04 ENCOUNTER — Other Ambulatory Visit: Payer: Self-pay | Admitting: Family Medicine

## 2011-01-30 ENCOUNTER — Ambulatory Visit (INDEPENDENT_AMBULATORY_CARE_PROVIDER_SITE_OTHER): Payer: Managed Care, Other (non HMO) | Admitting: Internal Medicine

## 2011-01-30 ENCOUNTER — Encounter: Payer: Self-pay | Admitting: Internal Medicine

## 2011-01-30 VITALS — BP 124/70 | HR 103 | Temp 98.1°F | Wt 209.0 lb

## 2011-01-30 DIAGNOSIS — N419 Inflammatory disease of prostate, unspecified: Secondary | ICD-10-CM

## 2011-01-30 NOTE — Progress Notes (Signed)
  Subjective:    Patient ID: [name] [name], male    DOB: 1965/11/06, 46 y.o.   MRN: [id number]  HPI   46 year old patient  who has been evaluated and treated twice recently for suspected acute prostatitis. A time he was 2 with 10 days of Cipro and responded promptly but yesterday developed severe pain in the perineal area. No fever dysuria frequency.     Review of Systems  Genitourinary: Positive for genital sores and testicular pain.       Objective:   Physical Exam  Genitourinary: Rectum normal, prostate normal and penis normal. Guaiac negative stool. No penile tenderness.       Rectal exam was performed palpation the prostate did not reproduce his discomfort          Assessment & Plan:   Perineal pain unclear etiology. Patient does seem to be improved at the present time and his clinical exam is unremarkable. It is possible that he has subacute prostatitis. Will hold antibiotic therapy at this time and clinically observe if symptoms recur we'll retreat with antibiotic therapy for 6 weeks

## 2011-01-31 ENCOUNTER — Encounter: Payer: Self-pay | Admitting: Internal Medicine

## 2011-01-31 ENCOUNTER — Ambulatory Visit: Payer: Managed Care, Other (non HMO) | Admitting: Family Medicine

## 2011-01-31 NOTE — Patient Instructions (Signed)
Call or return to clinic prn if these symptoms worsen or fail to improve as anticipated.

## 2011-05-01 ENCOUNTER — Other Ambulatory Visit: Payer: Self-pay | Admitting: *Deleted

## 2011-05-01 DIAGNOSIS — F411 Generalized anxiety disorder: Secondary | ICD-10-CM

## 2011-05-01 MED ORDER — PANTOPRAZOLE SODIUM 40 MG PO TBEC: 40.0000 mg | DELAYED_RELEASE_TABLET | Freq: Every day | ORAL | Status: DC

## 2011-05-01 NOTE — Telephone Encounter (Signed)
Refill both for 6 months. 

## 2011-05-01 NOTE — Telephone Encounter (Signed)
pantoprazole filled for 6 months  Clonazepam bid last filled #[phone] with 5 refills on 10-12-10

## 2011-05-02 MED ORDER — CLONAZEPAM 1 MG PO TABS: 1.0000 mg | ORAL_TABLET | Freq: Two times a day (BID) | ORAL | Status: DC | PRN

## 2011-10-15 ENCOUNTER — Other Ambulatory Visit: Payer: Self-pay | Admitting: *Deleted

## 2011-10-15 DIAGNOSIS — F411 Generalized anxiety disorder: Secondary | ICD-10-CM

## 2011-10-15 MED ORDER — TADALAFIL 5 MG PO TABS: 5.0000 mg | ORAL_TABLET | Freq: Every day | ORAL | Status: DC

## 2011-10-15 MED ORDER — CLONAZEPAM 1 MG PO TABS: 1.0000 mg | ORAL_TABLET | Freq: Two times a day (BID) | ORAL | Status: DC | PRN

## 2011-10-15 MED ORDER — SIMVASTATIN 40 MG PO TABS: 40.0000 mg | ORAL_TABLET | Freq: Every day | ORAL | Status: DC

## 2011-10-15 MED ORDER — PANTOPRAZOLE SODIUM 40 MG PO TBEC: 40.0000 mg | DELAYED_RELEASE_TABLET | Freq: Every day | ORAL | Status: DC

## 2011-10-15 NOTE — Telephone Encounter (Signed)
Pt informed he needs return OV before any more refills, 30 day supply sent to local pharmacy

## 2011-11-07 ENCOUNTER — Other Ambulatory Visit: Payer: Self-pay | Admitting: Family Medicine

## 2011-12-10 ENCOUNTER — Other Ambulatory Visit: Payer: Managed Care, Other (non HMO)

## 2011-12-21 ENCOUNTER — Other Ambulatory Visit (INDEPENDENT_AMBULATORY_CARE_PROVIDER_SITE_OTHER): Payer: Managed Care, Other (non HMO)

## 2011-12-21 DIAGNOSIS — Z0000 Encounter for general adult medical examination without abnormal findings: Secondary | ICD-10-CM

## 2011-12-21 LAB — BASIC METABOLIC PANEL
BUN: 15 mg/dL (ref 6–23)
CO2: 25 mEq/L (ref 19–32)
Calcium: 9.3 mg/dL (ref 8.4–10.5)
Chloride: 103 mEq/L (ref 96–112)
Creatinine, Ser: 1 mg/dL (ref 0.4–1.5)
GFR: 87.95 mL/min (ref >60.00)
Glucose, Bld: 89 mg/dL (ref 70–99)
Potassium: 3.7 mEq/L (ref 3.5–5.1)
Sodium: 137 mEq/L (ref 135–145)

## 2011-12-21 LAB — HEPATIC FUNCTION PANEL
ALT: 34 U/L (ref 0–53)
AST: 28 U/L (ref 0–37)
Albumin: 4.2 g/dL (ref 3.5–5.2)
Alkaline Phosphatase: 59 U/L (ref 39–117)
Bilirubin, Direct: 0.1 mg/dL (ref 0.0–0.3)
Total Bilirubin: 0.7 mg/dL (ref 0.3–1.2)
Total Protein: 7 g/dL (ref 6.0–8.3)

## 2011-12-21 LAB — CBC WITH DIFFERENTIAL/PLATELET
Basophils Absolute: 0 10*3/uL (ref 0.0–0.1)
Basophils Relative: 0.5 % (ref 0.0–3.0)
Eosinophils Absolute: 0.2 10*3/uL (ref 0.0–0.7)
Eosinophils Relative: 3.2 % (ref 0.0–5.0)
HCT: 42.4 % (ref 39.0–52.0)
Hemoglobin: 14.4 g/dL (ref 13.0–17.0)
Lymphocytes Relative: 29.6 % (ref 12.0–46.0)
Lymphs Abs: 2.3 10*3/uL (ref 0.7–4.0)
MCHC: 34 g/dL (ref 30.0–36.0)
MCV: 88.6 fl (ref 78.0–100.0)
Monocytes Absolute: 0.4 10*3/uL (ref 0.1–1.0)
Monocytes Relative: 5.7 % (ref 3.0–12.0)
Neutro Abs: 4.8 10*3/uL (ref 1.4–7.7)
Neutrophils Relative %: 61 % (ref 43.0–77.0)
Platelets: 221 10*3/uL (ref 150.0–400.0)
RBC: 4.78 Mil/uL (ref 4.22–5.81)
RDW: 12.2 % (ref 11.5–14.6)
WBC: 7.9 10*3/uL (ref 4.5–10.5)

## 2011-12-21 LAB — POCT URINALYSIS DIPSTICK
Bilirubin, UA: NEGATIVE
Blood, UA: NEGATIVE
Glucose, UA: NEGATIVE
Ketones, UA: NEGATIVE
Leukocytes, UA: NEGATIVE
Nitrite, UA: NEGATIVE
Protein, UA: NEGATIVE
Spec Grav, UA: 1.015
Urobilinogen, UA: 0.2
pH, UA: 6.5

## 2011-12-21 LAB — TSH: TSH: 1.24 u[IU]/mL (ref 0.35–5.50)

## 2011-12-21 LAB — LIPID PANEL
Cholesterol: 156 mg/dL (ref 0–200)
HDL: 40.9 mg/dL (ref >39.00)
LDL Cholesterol: 98 mg/dL (ref 0–99)
Total CHOL/HDL Ratio: 4
Triglycerides: 85 mg/dL (ref 0.0–149.0)
VLDL: 17 mg/dL (ref 0.0–40.0)

## 2011-12-28 ENCOUNTER — Encounter: Payer: Self-pay | Admitting: Family Medicine

## 2011-12-28 ENCOUNTER — Ambulatory Visit (INDEPENDENT_AMBULATORY_CARE_PROVIDER_SITE_OTHER): Payer: Managed Care, Other (non HMO) | Admitting: Family Medicine

## 2011-12-28 VITALS — BP 128/88 | HR 72 | Temp 98.2°F | Resp 16 | Ht 72.0 in | Wt 206.0 lb

## 2011-12-28 DIAGNOSIS — F411 Generalized anxiety disorder: Secondary | ICD-10-CM

## 2011-12-28 DIAGNOSIS — Z0000 Encounter for general adult medical examination without abnormal findings: Secondary | ICD-10-CM

## 2011-12-28 MED ORDER — CLONAZEPAM 1 MG PO TABS: 1.0000 mg | ORAL_TABLET | Freq: Two times a day (BID) | ORAL | Status: DC | PRN

## 2011-12-28 MED ORDER — PANTOPRAZOLE SODIUM 40 MG PO TBEC: 40.0000 mg | DELAYED_RELEASE_TABLET | Freq: Every day | ORAL | Status: DC

## 2011-12-28 MED ORDER — SIMVASTATIN 40 MG PO TABS: 40.0000 mg | ORAL_TABLET | Freq: Every day | ORAL | Status: DC

## 2011-12-28 MED ORDER — TADALAFIL 5 MG PO TABS: 5.0000 mg | ORAL_TABLET | Freq: Every day | ORAL | Status: DC

## 2011-12-28 NOTE — Progress Notes (Signed)
  Subjective:    Patient ID: [name] [name], male    DOB: [birth year], 46 y.o.   MRN: [id number]  HPI Patient for complete physical. Last tetanus 2010. Patient declines flu vaccine. He also declines Pneumovax. He still smokes less than half-pack cigarettes per day but is transitioning to electronic cigarettes and doing well. He has GERD which is well controlled with Protonix. He has chronic anxiety which has been treated for several years with clonazepam 1 mg twice daily. Takes simvastatin for hyperlipidemia. Has erectile dysfunction treated with Cialis. Positive family history of premature CAD in father who had bypass age 29  Past Medical History  Diagnosis Date  . Acute bronchitis 03/31/2009  . ANXIETY 11/10/2008  . EPIGASTRIC PAIN 12/08/2009  . 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 Other and unspecified hyperlipidemia 12/29/2008   No past surgical history on file.  reports that he has been smoking Cigarettes.  He has a 8 pack-year smoking history. He has never used smokeless tobacco. He reports that he drinks alcohol. He reports that he does not use illicit drugs. family history includes Cancer in his mother; Heart disease in his paternal aunt and paternal uncle; and Heart disease (age of onset:36) in his father.  There is no history of Diabetes and Stroke. No Known Allergies    Review of Systems  Constitutional: Negative for fever, activity change, appetite change and fatigue.  HENT: Negative for ear pain, congestion and trouble swallowing.   Eyes: Negative for pain and visual disturbance.  Respiratory: Negative for cough, shortness of breath and wheezing.   Cardiovascular: Negative for chest pain and palpitations.  Gastrointestinal: Negative for nausea, vomiting, abdominal pain, diarrhea, constipation, blood in stool, abdominal distention and rectal pain.  Genitourinary: Negative for dysuria, hematuria and testicular pain.  Musculoskeletal: Negative for joint swelling and arthralgias.   Skin: Negative for rash.  Neurological: Negative for dizziness, syncope and headaches.  Hematological: Negative for adenopathy.  Psychiatric/Behavioral: Negative for confusion and dysphoric mood.       Objective:   Physical Exam  Constitutional: He is oriented to person, place, and time. He appears well-developed and well-nourished. No distress.  HENT:  Head: Normocephalic and atraumatic.  Right Ear: External ear normal.  Left Ear: External ear normal.  Mouth/Throat: Oropharynx is clear and moist.  Eyes: Conjunctivae normal and EOM are normal. Pupils are equal, round, and reactive to light.  Neck: Normal range of motion. Neck supple. No thyromegaly present.  Cardiovascular: Normal rate, regular rhythm and normal heart sounds.   No murmur heard. Pulmonary/Chest: No respiratory distress. He has no wheezes. He has no rales.  Abdominal: Soft. Bowel sounds are normal. He exhibits no distension and no mass. There is no tenderness. There is no rebound and no guarding.  Genitourinary: Rectum normal and prostate normal.  Musculoskeletal: He exhibits no edema.  Lymphadenopathy:    He has no cervical adenopathy.  Neurological: He is alert and oriented to person, place, and time. He displays normal reflexes. No cranial nerve deficit.  Skin: No rash noted.  Psychiatric: He has a normal mood and affect.          Assessment & Plan:  Complete physical. Tetanus up-to-date. Declines flu vaccine and Pneumovax. Smoking cessation discussed. Refilled routine medications for one year.

## 2011-12-28 NOTE — Patient Instructions (Addendum)
Smoking Cessation Quitting smoking is important to your health and has many advantages. However, it is not always easy to quit since nicotine is a very addictive drug. Often times, people try 3 times or more before being able to quit. This document explains the best ways for you to prepare to quit smoking. Quitting takes hard work and a lot of effort, but you can do it. ADVANTAGES OF QUITTING SMOKING  You will live longer, feel better, and live better.  Your body will feel the impact of quitting smoking almost immediately.  Within 20 minutes, blood pressure decreases. Your pulse returns to its normal level.  After 8 hours, carbon monoxide levels in the blood return to normal. Your oxygen level increases.  After 24 hours, the chance of having a heart attack starts to decrease. Your breath, hair, and body stop smelling like smoke.  After 48 hours, damaged nerve endings begin to recover. Your sense of taste and smell improve.  After 72 hours, the body is virtually free of nicotine. Your bronchial tubes relax and breathing becomes easier.  After 2 to 12 weeks, lungs can hold more air. Exercise becomes easier and circulation improves.  The risk of having a heart attack, stroke, cancer, or lung disease is greatly reduced.  After 1 year, the risk of coronary heart disease is cut in half.  After 5 years, the risk of stroke falls to the same as a nonsmoker.  After 10 years, the risk of lung cancer is cut in half and the risk of other cancers decreases significantly.  After 15 years, the risk of coronary heart disease drops, usually to the level of a nonsmoker.  If you are pregnant, quitting smoking will improve your chances of having a healthy baby.  The people you live with, especially any children, will be healthier.  You will have extra money to spend on things other than cigarettes. QUESTIONS TO THINK ABOUT BEFORE ATTEMPTING TO QUIT You may want to talk about your answers with your  caregiver.  Why do you want to quit?  If you tried to quit in the past, what helped and what did not?  What will be the most difficult situations for you after you quit? How will you plan to handle them?  Who can help you through the tough times? Your family? Friends? A caregiver?  What pleasures do you get from smoking? What ways can you still get pleasure if you quit? Here are some questions to ask your caregiver:  How can you help me to be successful at quitting?  What medicine do you think would be best for me and how should I take it?  What should I do if I need more help?  What is smoking withdrawal like? How can I get information on withdrawal? GET READY  Set a quit date.  Change your environment by getting rid of all cigarettes, ashtrays, matches, and lighters in your home, car, or work. Do not let people smoke in your home.  Review your past attempts to quit. Think about what worked and what did not. GET SUPPORT AND ENCOURAGEMENT You have a better chance of being successful if you have help. You can get support in many ways.  Tell your family, friends, and co-workers that you are going to quit and need their support. Ask them not to smoke around you.  Get individual, group, or telephone counseling and support. Programs are available at local hospitals and health centers. Call your local health department for   information about programs in your area.  Spiritual beliefs and practices may help some smokers quit.  Download a "quit meter" on your computer to keep track of quit statistics, such as how long you have gone without smoking, cigarettes not smoked, and money saved.  Get a self-help book about quitting smoking and staying off of tobacco. LEARN NEW SKILLS AND BEHAVIORS  Distract yourself from urges to smoke. Talk to someone, go for a walk, or occupy your time with a task.  Change your normal routine. Take a different route to work. Drink tea instead of coffee.  Eat breakfast in a different place.  Reduce your stress. Take a hot bath, exercise, or read a book.  Plan something enjoyable to do every day. Reward yourself for not smoking.  Explore interactive web-based programs that specialize in helping you quit. GET MEDICINE AND USE IT CORRECTLY Medicines can help you stop smoking and decrease the urge to smoke. Combining medicine with the above behavioral methods and support can greatly increase your chances of successfully quitting smoking.  Nicotine replacement therapy helps deliver nicotine to your body without the negative effects and risks of smoking. Nicotine replacement therapy includes nicotine gum, lozenges, inhalers, nasal sprays, and skin patches. Some may be available over-the-counter and others require a prescription.  Antidepressant medicine helps people abstain from smoking, but how this works is unknown. This medicine is available by prescription.  Nicotinic receptor partial agonist medicine simulates the effect of nicotine in your brain. This medicine is available by prescription. Ask your caregiver for advice about which medicines to use and how to use them based on your health history. Your caregiver will tell you what side effects to look out for if you choose to be on a medicine or therapy. Carefully read the information on the package. Do not use any other product containing nicotine while using a nicotine replacement product.  RELAPSE OR DIFFICULT SITUATIONS Most relapses occur within the first 3 months after quitting. Do not be discouraged if you start smoking again. Remember, most people try several times before finally quitting. You may have symptoms of withdrawal because your body is used to nicotine. You may crave cigarettes, be irritable, feel very hungry, cough often, get headaches, or have difficulty concentrating. The withdrawal symptoms are only temporary. They are strongest when you first quit, but they will go away within  10 14 days. To reduce the chances of relapse, try to:  Avoid drinking alcohol. Drinking lowers your chances of successfully quitting.  Reduce the amount of caffeine you consume. Once you quit smoking, the amount of caffeine in your body increases and can give you symptoms, such as a rapid heartbeat, sweating, and anxiety.  Avoid smokers because they can make you want to smoke.  Do not let weight gain distract you. Many smokers will gain weight when they quit, usually less than 10 pounds. Eat a healthy diet and stay active. You can always lose the weight gained after you quit.  Find ways to improve your mood other than smoking. FOR MORE INFORMATION  www.smokefree.gov  Document Released: 12/12/2000 Document Revised: 06/19/2011 Document Reviewed: 03/29/2011 [facility] Patient Information 2013 [place], [place].  Consider aspirin one daily.

## 2011-12-31 ENCOUNTER — Encounter: Payer: Managed Care, Other (non HMO) | Admitting: Family Medicine

## 2011-12-31 ENCOUNTER — Telehealth: Payer: Self-pay | Admitting: Family Medicine

## 2011-12-31 NOTE — Telephone Encounter (Signed)
Patient Information:  Caller Name: Blain  Phone: [phone]  Patient: [name] [name]  Gender: Male  DOB: 12-Oct-1965  Age: 46 Years  PCP: Evelena Peat [facility])  Office Follow Up:  Does the office need to follow up with this patient?: Yes  Instructions For The Office: asking for medication for prostate issues  RN Note:  Denies any problems with urination; no fever.  States that he had his PE on 12/27 and Dr. Caryl Never told him then that his prostate was slightly enlarged.    [facility] [address].  Symptoms  Reason For Call & Symptoms: Feels that he has another prostate infection.  Has pain that is worse at night  Reviewed Health History In EMR: Yes  Reviewed Medications In EMR: Yes  Reviewed Allergies In EMR: Yes  Reviewed Surgeries / Procedures: Yes  Date of Onset of Symptoms: 12/28/2011  Guideline(s) Used:  No Protocol Available - Sick Adult  Disposition Per Guideline:   Callback by PCP or Subspecialist within 1 Hour  Reason For Disposition Reached:   Nursing judgment  Advice Given:  N/A

## 2011-12-31 NOTE — Telephone Encounter (Signed)
He did have some mild prostate enlargement but no evidence for acute infection at that time. However, if he is now having new symptoms consistent with prior prostatitis start Cipro 500 mg twice daily for 10 days. Needs followup if symptoms persist beyond that

## 2011-12-31 NOTE — Telephone Encounter (Signed)
Encounter mistakenly closed by CAN. Please see and advise.

## 2012-01-01 ENCOUNTER — Other Ambulatory Visit: Payer: Self-pay | Admitting: Family Medicine

## 2012-01-01 MED ORDER — CIPROFLOXACIN HCL 500 MG PO TABS: 500.0000 mg | ORAL_TABLET | Freq: Two times a day (BID) | ORAL | Status: DC

## 2012-01-01 NOTE — Telephone Encounter (Signed)
Pt called to follow up on Cipro 500mg   that was to be called in.  Pharm: [facility] [address]  Thank you!!

## 2012-01-01 NOTE — Telephone Encounter (Signed)
abx sent to pharmacy

## 2012-01-01 NOTE — Telephone Encounter (Signed)
Script was sent e-scribe and I spoke with pt. 

## 2012-05-01 ENCOUNTER — Ambulatory Visit (INDEPENDENT_AMBULATORY_CARE_PROVIDER_SITE_OTHER): Payer: Managed Care, Other (non HMO) | Admitting: Family Medicine

## 2012-05-01 VITALS — BP 120/78 | Temp 98.4°F

## 2012-05-01 DIAGNOSIS — R102 Pelvic and perineal pain: Secondary | ICD-10-CM

## 2012-05-01 DIAGNOSIS — N509 Disorder of male genital organs, unspecified: Secondary | ICD-10-CM

## 2012-05-01 DIAGNOSIS — R300 Dysuria: Secondary | ICD-10-CM

## 2012-05-01 LAB — POCT URINALYSIS DIPSTICK
Blood, UA: NEGATIVE
Glucose, UA: NEGATIVE
Ketones, UA: NEGATIVE
Leukocytes, UA: NEGATIVE
Nitrite, UA: NEGATIVE
Protein, UA: NEGATIVE
Spec Grav, UA: 1.015
Urobilinogen, UA: 0.2
pH, UA: 6

## 2012-05-01 MED ORDER — CIPROFLOXACIN HCL 500 MG PO TABS: 500.0000 mg | ORAL_TABLET | Freq: Two times a day (BID) | ORAL | Status: DC

## 2012-05-01 NOTE — Patient Instructions (Addendum)
Fiber Content in Foods Drinking plenty of fluids and consuming foods high in fiber can help with constipation. See the list below for the fiber content of some common foods. Starches and Grains / Dietary Fiber (g)  Cheerios, 1 cup / 3 g  Kellogg's Corn Flakes, 1 cup / 0.7 g  Rice Krispies, 1  cup / 0.3 g  Quaker Oat Life Cereal,  cup / 2.1 g  Oatmeal, instant (cooked),  cup / 2 g  Kellogg's Frosted Mini Wheats, 1 cup / 5.1 g  Rice, brown, long-grain (cooked), 1 cup / 3.5 g  Rice, white, long-grain (cooked), 1 cup / 0.6 g  Macaroni, cooked, enriched, 1 cup / 2.5 g Legumes / Dietary Fiber (g)  Beans, baked, canned, plain or vegetarian,  cup / 5.2 g  Beans, kidney, canned,  cup / 6.8 g  Beans, pinto, dried (cooked),  cup / 7.7 g  Beans, pinto, canned,  cup / 5.5 g Breads and Crackers / Dietary Fiber (g)  Graham crackers, plain or honey, 2 squares / 0.7 g  Saltine crackers, 3 squares / 0.3 g  Pretzels, plain, salted, 10 pieces / 1.8 g  Bread, whole-wheat, 1 slice / 1.9 g  Bread, white, 1 slice / 0.7 g  Bread, raisin, 1 slice / 1.2 g  Bagel, plain, 3 oz / 2 g  Tortilla, flour, 1 oz / 0.9 g  Tortilla, corn, 1 small / 1.5 g  Bun, hamburger or hotdog, 1 small / 0.9 g Fruits / Dietary Fiber (g)  Apple, raw with skin, 1 medium / 4.4 g  Applesauce, sweetened,  cup / 1.5 g  Banana,  medium / 1.5 g  Grapes, 10 grapes / 0.4 g  Orange, 1 small / 2.3 g  Raisin, 1.5 oz / 1.6 g  Melon, 1 cup / 1.4 g Vegetables / Dietary Fiber (g)  Green beans, canned,  cup / 1.3 g  Carrots (cooked),  cup / 2.3 g  Broccoli (cooked),  cup / 2.8 g  Peas, frozen (cooked),  cup / 4.4 g  Potatoes, mashed,  cup / 1.6 g  Lettuce, 1 cup / 0.5 g  Corn, canned,  cup / 1.6 g  Tomato,  cup / 1.1 g Document Released: 05/06/2006 Document Revised: 03/12/2011 Document Reviewed: 07/01/2006 [employer] Patient Information 2013 [employer].  

## 2012-05-01 NOTE — Progress Notes (Signed)
  Subjective:    Patient ID: [name] [name], male    DOB: [birth year], 47 y.o.   MRN: [id number]  HPI Patient seen with recurrent mild discomfort perineal region. Has had similar occurrence in the past. With each episode, he has taken Cipro and symptoms have improved. He did not have any clear prostatitis findings on exam last time this occurred. Denies any dysuria. Pain is not exacerbated by sitting Denies any fever or chills. He has some mild chronic constipation which is unchanged. No bloody stools. No pain with stools. No perianal pain  Past Medical History  Diagnosis Date  . Acute bronchitis 03/31/2009  . ANXIETY 11/10/2008  . EPIGASTRIC PAIN 12/08/2009  . 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 Other and unspecified hyperlipidemia 12/29/2008   No past surgical history on file.  reports that he has been smoking Cigarettes.  He has a 8 pack-year smoking history. He has never used smokeless tobacco. He reports that  drinks alcohol. He reports that he does not use illicit drugs. family history includes Cancer in his mother; Heart disease in his paternal aunt and paternal uncle; and Heart disease (age of onset: 14) in his father.  There is no history of Diabetes and Stroke. No Known Allergies    Review of Systems  Constitutional: Negative for fever and chills.  Genitourinary: Negative for dysuria and discharge.       Objective:   Physical Exam  Constitutional: He appears well-developed and well-nourished. No distress.  Cardiovascular: Normal rate and regular rhythm.   Pulmonary/Chest: Effort normal and breath sounds normal. No respiratory distress. He has no wheezes. He has no rales.  Genitourinary: Rectum normal and prostate normal.  No evidence for perianal abscess. No erythema. No obvious swelling. Prostate normal in size. Non-boggy. Nontender. He has some hard stool in the rectal vault but no impaction         Assessment & Plan:  Perineal pain of uncertain etiology. We  cannot confirm definite prostatitis based on exam. Nonetheless, patient is requesting refill antibiotics which seemed to help previously. He does not have any evidence for perianal abscess. We refilled Cipro 500 mg twice a day for 10 days Urine dipstick is normal

## 2012-07-07 ENCOUNTER — Other Ambulatory Visit: Payer: Self-pay | Admitting: Family Medicine

## 2012-07-09 NOTE — Telephone Encounter (Signed)
Refill for 3 months. 

## 2012-12-18 ENCOUNTER — Other Ambulatory Visit: Payer: Self-pay | Admitting: Family Medicine

## 2012-12-19 NOTE — Telephone Encounter (Signed)
Klonopin   Last visit 05/01/12 Last refill 07/07/12 #[phone] refills

## 2012-12-21 NOTE — Telephone Encounter (Signed)
Refill for 4 months.  Will need office follow up by May.

## 2013-01-30 ENCOUNTER — Other Ambulatory Visit (INDEPENDENT_AMBULATORY_CARE_PROVIDER_SITE_OTHER): Payer: BC Managed Care – PPO

## 2013-01-30 DIAGNOSIS — Z0000 Encounter for general adult medical examination without abnormal findings: Secondary | ICD-10-CM

## 2013-01-30 LAB — HEPATIC FUNCTION PANEL
ALT: 25 U/L (ref 0–53)
AST: 23 U/L (ref 0–37)
Albumin: 4.3 g/dL (ref 3.5–5.2)
Alkaline Phosphatase: 56 U/L (ref 39–117)
Bilirubin, Direct: 0 mg/dL (ref 0.0–0.3)
Total Bilirubin: 0.7 mg/dL (ref 0.3–1.2)
Total Protein: 7.4 g/dL (ref 6.0–8.3)

## 2013-01-30 LAB — CBC WITH DIFFERENTIAL/PLATELET
Basophils Absolute: 0 10*3/uL (ref 0.0–0.1)
Basophils Relative: 0.4 % (ref 0.0–3.0)
Eosinophils Absolute: 0.4 10*3/uL (ref 0.0–0.7)
Eosinophils Relative: 4.5 % (ref 0.0–5.0)
HCT: 46.8 % (ref 39.0–52.0)
Hemoglobin: 15.6 g/dL (ref 13.0–17.0)
Lymphocytes Relative: 27.1 % (ref 12.0–46.0)
Lymphs Abs: 2.3 10*3/uL (ref 0.7–4.0)
MCHC: 33.3 g/dL (ref 30.0–36.0)
MCV: 91 fl (ref 78.0–100.0)
Monocytes Absolute: 0.5 10*3/uL (ref 0.1–1.0)
Monocytes Relative: 6.5 % (ref 3.0–12.0)
Neutro Abs: 5.2 10*3/uL (ref 1.4–7.7)
Neutrophils Relative %: 61.5 % (ref 43.0–77.0)
Platelets: 230 10*3/uL (ref 150.0–400.0)
RBC: 5.15 Mil/uL (ref 4.22–5.81)
RDW: 12.4 % (ref 11.5–14.6)
WBC: 8.5 10*3/uL (ref 4.5–10.5)

## 2013-01-30 LAB — LIPID PANEL
Cholesterol: 156 mg/dL (ref 0–200)
HDL: 40.9 mg/dL (ref >39.00)
LDL Cholesterol: 103 mg/dL — ABNORMAL HIGH (ref 0–99)
Total CHOL/HDL Ratio: 4
Triglycerides: 61 mg/dL (ref 0.0–149.0)
VLDL: 12.2 mg/dL (ref 0.0–40.0)

## 2013-01-30 LAB — POCT URINALYSIS DIPSTICK
Bilirubin, UA: NEGATIVE
Blood, UA: NEGATIVE
Glucose, UA: NEGATIVE
Ketones, UA: NEGATIVE
Leukocytes, UA: NEGATIVE
Nitrite, UA: NEGATIVE
Protein, UA: NEGATIVE
Spec Grav, UA: 1.015
Urobilinogen, UA: 0.2
pH, UA: 7.5

## 2013-01-30 LAB — BASIC METABOLIC PANEL
BUN: 13 mg/dL (ref 6–23)
CO2: 29 mEq/L (ref 19–32)
Calcium: 9.6 mg/dL (ref 8.4–10.5)
Chloride: 106 mEq/L (ref 96–112)
Creatinine, Ser: 1 mg/dL (ref 0.4–1.5)
GFR: 80.78 mL/min (ref >60.00)
Glucose, Bld: 99 mg/dL (ref 70–99)
Potassium: 4.8 mEq/L (ref 3.5–5.1)
Sodium: 140 mEq/L (ref 135–145)

## 2013-01-30 LAB — TSH: TSH: 1.18 u[IU]/mL (ref 0.35–5.50)

## 2013-02-02 ENCOUNTER — Encounter: Payer: Managed Care, Other (non HMO) | Admitting: Family Medicine

## 2013-02-05 ENCOUNTER — Encounter: Payer: Self-pay | Admitting: Family Medicine

## 2013-02-05 ENCOUNTER — Ambulatory Visit (INDEPENDENT_AMBULATORY_CARE_PROVIDER_SITE_OTHER): Payer: BC Managed Care – PPO | Admitting: Family Medicine

## 2013-02-05 VITALS — BP 120/82 | HR 83 | Temp 97.9°F | Ht 72.0 in | Wt 199.0 lb

## 2013-02-05 DIAGNOSIS — Z0000 Encounter for general adult medical examination without abnormal findings: Secondary | ICD-10-CM

## 2013-02-05 MED ORDER — TADALAFIL 5 MG PO TABS: 5.0000 mg | ORAL_TABLET | Freq: Every day | ORAL | Status: DC

## 2013-02-05 NOTE — Progress Notes (Signed)
   Subjective:    Patient ID: [name] [name], male    DOB: 07-Nov-1965, 48 y.o.   MRN: [id number]  HPI   patient here for complete physical He has ongoing nicotine use about one pack cigarettes per day. Not currently interested in quitting. His chronic problems include history of GERD, hyperlipidemia, erectile dysfunction and chronic anxiety. His father had coronary disease in his 24s. Patient does take baby aspirin. No recent chest pains. Consistently takes simvastatin. No history of hypertension. No history of diabetes. He declines flu vaccine.  Past Medical History  Diagnosis Date  . Acute bronchitis 03/31/2009  . ANXIETY 11/10/2008  . EPIGASTRIC PAIN 12/08/2009  . 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 Other and unspecified hyperlipidemia 12/29/2008   No past surgical history on file.  reports that he has been smoking Cigarettes.  He has a 8 pack-year smoking history. He has never used smokeless tobacco. He reports that he drinks alcohol. He reports that he does not use illicit drugs. family history includes Cancer in his mother; Heart disease in his paternal aunt and paternal uncle; Heart disease (age of onset: 74) in his father. There is no history of Diabetes or Stroke. No Known Allergies   Review of Systems  Constitutional: Negative for fever, activity change, appetite change and fatigue.  HENT: Negative for congestion, ear pain and trouble swallowing.   Eyes: Negative for pain and visual disturbance.  Respiratory: Negative for cough, shortness of breath and wheezing.   Cardiovascular: Negative for chest pain and palpitations.  Gastrointestinal: Negative for nausea, vomiting, abdominal pain, diarrhea, constipation, blood in stool, abdominal distention and rectal pain.  Genitourinary: Negative for dysuria, hematuria and testicular pain.  Musculoskeletal: Negative for arthralgias and joint swelling.  Skin: Negative for rash.  Neurological: Negative for dizziness, syncope and  headaches.  Hematological: Negative for adenopathy.  Psychiatric/Behavioral: Negative for confusion and dysphoric mood.       Objective:   Physical Exam  Constitutional: He is oriented to person, place, and time. He appears well-developed and well-nourished. No distress.  HENT:  Head: Normocephalic and atraumatic.  Right Ear: External ear normal.  Left Ear: External ear normal.  Mouth/Throat: Oropharynx is clear and moist.  Eyes: Conjunctivae and EOM are normal. Pupils are equal, round, and reactive to light.  Neck: Normal range of motion. Neck supple. No thyromegaly present.  Cardiovascular: Normal rate, regular rhythm and normal heart sounds.   No murmur heard. Pulmonary/Chest: No respiratory distress. He has no wheezes. He has no rales.  Abdominal: Soft. Bowel sounds are normal. He exhibits no distension and no mass. There is no tenderness. There is no rebound and no guarding.  Musculoskeletal: He exhibits no edema.  Lymphadenopathy:    He has no cervical adenopathy.  Neurological: He is alert and oriented to person, place, and time. He displays normal reflexes. No cranial nerve deficit.  Skin: No rash noted.  Psychiatric: He has a normal mood and affect.          Assessment & Plan:  Complete physical. We discussed at some length importance of quitting smoking his current motivation is low. We will consider with the next year. Refill Cialis. Labs reviewed. Continue baby aspirin.

## 2013-02-05 NOTE — Progress Notes (Signed)
Pre visit review using our clinic review tool, if applicable. No additional management support is needed unless otherwise documented below in the visit note. 

## 2013-02-05 NOTE — Patient Instructions (Signed)
Smoking Cessation Quitting smoking is important to your health and has many advantages. However, it is not always easy to quit since nicotine is a very addictive drug. Often times, people try 3 times or more before being able to quit. This document explains the best ways for you to prepare to quit smoking. Quitting takes hard work and a lot of effort, but you can do it. ADVANTAGES OF QUITTING SMOKING  You will live longer, feel better, and live better.  Your body will feel the impact of quitting smoking almost immediately.  Within 20 minutes, blood pressure decreases. Your pulse returns to its normal level.  After 8 hours, carbon monoxide levels in the blood return to normal. Your oxygen level increases.  After 24 hours, the chance of having a heart attack starts to decrease. Your breath, hair, and body stop smelling like smoke.  After 48 hours, damaged nerve endings begin to recover. Your sense of taste and smell improve.  After 72 hours, the body is virtually free of nicotine. Your bronchial tubes relax and breathing becomes easier.  After 2 to 12 weeks, lungs can hold more air. Exercise becomes easier and circulation improves.  The risk of having a heart attack, stroke, cancer, or lung disease is greatly reduced.  After 1 year, the risk of coronary heart disease is cut in half.  After 5 years, the risk of stroke falls to the same as a nonsmoker.  After 10 years, the risk of lung cancer is cut in half and the risk of other cancers decreases significantly.  After 15 years, the risk of coronary heart disease drops, usually to the level of a nonsmoker.  If you are pregnant, quitting smoking will improve your chances of having a healthy baby.  The people you live with, especially any children, will be healthier.  You will have extra money to spend on things other than cigarettes. QUESTIONS TO THINK ABOUT BEFORE ATTEMPTING TO QUIT You may want to talk about your answers with your  caregiver.  Why do you want to quit?  If you tried to quit in the past, what helped and what did not?  What will be the most difficult situations for you after you quit? How will you plan to handle them?  Who can help you through the tough times? Your family? Friends? A caregiver?  What pleasures do you get from smoking? What ways can you still get pleasure if you quit? Here are some questions to ask your caregiver:  How can you help me to be successful at quitting?  What medicine do you think would be best for me and how should I take it?  What should I do if I need more help?  What is smoking withdrawal like? How can I get information on withdrawal? GET READY  Set a quit date.  Change your environment by getting rid of all cigarettes, ashtrays, matches, and lighters in your home, car, or work. Do not let people smoke in your home.  Review your past attempts to quit. Think about what worked and what did not. GET SUPPORT AND ENCOURAGEMENT You have a better chance of being successful if you have help. You can get support in many ways.  Tell your family, friends, and co-workers that you are going to quit and need their support. Ask them not to smoke around you.  Get individual, group, or telephone counseling and support. Programs are available at local hospitals and health centers. Call your local health department for   information about programs in your area.  Spiritual beliefs and practices may help some smokers quit.  Download a "quit meter" on your computer to keep track of quit statistics, such as how long you have gone without smoking, cigarettes not smoked, and money saved.  Get a self-help book about quitting smoking and staying off of tobacco. LEARN NEW SKILLS AND BEHAVIORS  Distract yourself from urges to smoke. Talk to someone, go for a walk, or occupy your time with a task.  Change your normal routine. Take a different route to work. Drink tea instead of coffee.  Eat breakfast in a different place.  Reduce your stress. Take a hot bath, exercise, or read a book.  Plan something enjoyable to do every day. Reward yourself for not smoking.  Explore interactive web-based programs that specialize in helping you quit. GET MEDICINE AND USE IT CORRECTLY Medicines can help you stop smoking and decrease the urge to smoke. Combining medicine with the above behavioral methods and support can greatly increase your chances of successfully quitting smoking.  Nicotine replacement therapy helps deliver nicotine to your body without the negative effects and risks of smoking. Nicotine replacement therapy includes nicotine gum, lozenges, inhalers, nasal sprays, and skin patches. Some may be available over-the-counter and others require a prescription.  Antidepressant medicine helps people abstain from smoking, but how this works is unknown. This medicine is available by prescription.  Nicotinic receptor partial agonist medicine simulates the effect of nicotine in your brain. This medicine is available by prescription. Ask your caregiver for advice about which medicines to use and how to use them based on your health history. Your caregiver will tell you what side effects to look out for if you choose to be on a medicine or therapy. Carefully read the information on the package. Do not use any other product containing nicotine while using a nicotine replacement product.  RELAPSE OR DIFFICULT SITUATIONS Most relapses occur within the first 3 months after quitting. Do not be discouraged if you start smoking again. Remember, most people try several times before finally quitting. You may have symptoms of withdrawal because your body is used to nicotine. You may crave cigarettes, be irritable, feel very hungry, cough often, get headaches, or have difficulty concentrating. The withdrawal symptoms are only temporary. They are strongest when you first quit, but they will go away within  10 14 days. To reduce the chances of relapse, try to:  Avoid drinking alcohol. Drinking lowers your chances of successfully quitting.  Reduce the amount of caffeine you consume. Once you quit smoking, the amount of caffeine in your body increases and can give you symptoms, such as a rapid heartbeat, sweating, and anxiety.  Avoid smokers because they can make you want to smoke.  Do not let weight gain distract you. Many smokers will gain weight when they quit, usually less than 10 pounds. Eat a healthy diet and stay active. You can always lose the weight gained after you quit.  Find ways to improve your mood other than smoking. FOR MORE INFORMATION  www.smokefree.gov  Document Released: 12/12/2000 Document Revised: 06/19/2011 Document Reviewed: 03/29/2011 [employer] Patient Information 2014 [employer].  

## 2013-02-06 ENCOUNTER — Telehealth: Payer: Self-pay | Admitting: Family Medicine

## 2013-02-06 NOTE — Telephone Encounter (Signed)
Relevant patient education mailed to patient.  

## 2013-02-20 ENCOUNTER — Encounter: Payer: Self-pay | Admitting: Family Medicine

## 2013-06-04 ENCOUNTER — Other Ambulatory Visit: Payer: Self-pay | Admitting: Family Medicine

## 2013-06-21 ENCOUNTER — Other Ambulatory Visit: Payer: Self-pay | Admitting: Family Medicine

## 2013-06-22 NOTE — Telephone Encounter (Signed)
Refill for 6 months. 

## 2013-06-22 NOTE — Telephone Encounter (Signed)
Last visit 02/05/13 Last refill 12/18/12 #60 3 refills

## 2013-06-27 ENCOUNTER — Other Ambulatory Visit: Payer: Self-pay | Admitting: Family Medicine

## 2013-07-06 ENCOUNTER — Telehealth: Payer: Self-pay | Admitting: Family Medicine

## 2013-07-06 NOTE — Telephone Encounter (Signed)
[employer] denied Cialis.  Pt must try and fail Viagra first.

## 2013-07-06 NOTE — Telephone Encounter (Signed)
I will send the denial letter back for review before it is sent to scanning.

## 2013-11-18 ENCOUNTER — Other Ambulatory Visit: Payer: Self-pay | Admitting: Family Medicine

## 2013-11-19 NOTE — Telephone Encounter (Signed)
Refill times 3 months.

## 2013-11-19 NOTE — Telephone Encounter (Signed)
Last visit 02/05/13 Last refill 06/23/13 #60 3 refill

## 2013-12-08 ENCOUNTER — Ambulatory Visit (INDEPENDENT_AMBULATORY_CARE_PROVIDER_SITE_OTHER): Payer: BC Managed Care – PPO | Admitting: Family Medicine

## 2013-12-08 ENCOUNTER — Encounter: Payer: Self-pay | Admitting: Family Medicine

## 2013-12-08 VITALS — BP 128/82 | HR 84 | Temp 98.0°F | Wt 200.0 lb

## 2013-12-08 DIAGNOSIS — M6740 Ganglion, unspecified site: Secondary | ICD-10-CM

## 2013-12-08 NOTE — Progress Notes (Signed)
Pre visit review using our clinic review tool, if applicable. No additional management support is needed unless otherwise documented below in the visit note. 

## 2013-12-08 NOTE — Patient Instructions (Signed)

## 2013-12-08 NOTE — Progress Notes (Signed)
   Subjective:    Patient ID: [name] [name], male    DOB: Dec 17, 1965, 48 y.o.   MRN: [id number]  HPI Patient seen with non-tender cyst right middle finger. Location is dorsal over PIP joint. This came up several months ago. Denies any history of specific trauma. Full range of motion finger. He had similar type cyst on his wrist several years ago that went away spontaneously.  Past Medical History  Diagnosis Date  . Acute bronchitis 03/31/2009  . ANXIETY 11/10/2008  . EPIGASTRIC PAIN 12/08/2009  . 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 Other and unspecified hyperlipidemia 12/29/2008   No past surgical history on file.  reports that he has been smoking Cigarettes.  He has a 8 pack-year smoking history. He has never used smokeless tobacco. He reports that he drinks alcohol. He reports that he does not use illicit drugs. family history includes Cancer in his mother; Heart disease in his paternal aunt and paternal uncle; Heart disease (age of onset: 69) in his father. There is no history of Diabetes or Stroke. No Known Allergies    Review of Systems  Neurological: Negative for weakness and numbness.       Objective:   Physical Exam  Constitutional: He appears well-developed and well-nourished.  Cardiovascular: Normal rate and regular rhythm.   Musculoskeletal:  Right middle finger reveals full range of motion all joints. Over the PIP joint dorsally he has one half centimeter mobile nontender cystic lesion          Assessment & Plan:  Probable ganglion cyst right middle finger. No restricted range of motion. Reassurance and handout given. Offered orthopedic referral if this becomes more bothersome. He is aware that most of these resolve spontaneously

## 2014-02-11 ENCOUNTER — Emergency Department: Payer: Self-pay | Admitting: Emergency Medicine

## 2014-03-06 ENCOUNTER — Other Ambulatory Visit: Payer: Self-pay | Admitting: Family Medicine

## 2014-03-10 ENCOUNTER — Telehealth: Payer: Self-pay | Admitting: Family Medicine

## 2014-03-10 ENCOUNTER — Other Ambulatory Visit: Payer: Self-pay | Admitting: Family Medicine

## 2014-03-10 NOTE — Telephone Encounter (Signed)
NO BPH hx that I am aware of.

## 2014-03-10 NOTE — Telephone Encounter (Signed)
I received a PA request for Cialis 5 mg, 30 per 30.  Is the patient paying out of pocket for this medication? Patient's plan will not cover Cialis 5 mg, 30 per 30 for erectile dysfunction, it can be approved for BPH.

## 2014-04-08 ENCOUNTER — Other Ambulatory Visit: Payer: Self-pay | Admitting: Family Medicine

## 2014-04-08 NOTE — Telephone Encounter (Signed)
Last visit 12/08/13 Last refill 11/20/13 #60 2 refill

## 2014-04-09 NOTE — Telephone Encounter (Signed)
Refill for 6 months.  Office follow up by next Fall.

## 2014-08-22 ENCOUNTER — Other Ambulatory Visit: Payer: Self-pay | Admitting: Family Medicine

## 2014-11-18 ENCOUNTER — Other Ambulatory Visit: Payer: Self-pay | Admitting: Family Medicine

## 2014-11-19 ENCOUNTER — Other Ambulatory Visit: Payer: Self-pay | Admitting: Family Medicine

## 2014-12-10 ENCOUNTER — Other Ambulatory Visit (INDEPENDENT_AMBULATORY_CARE_PROVIDER_SITE_OTHER): Payer: BLUE CROSS/BLUE SHIELD

## 2014-12-10 DIAGNOSIS — Z0000 Encounter for general adult medical examination without abnormal findings: Secondary | ICD-10-CM

## 2014-12-10 LAB — CBC WITH DIFFERENTIAL/PLATELET
Basophils Absolute: 0.1 10*3/uL (ref 0.0–0.1)
Basophils Relative: 0.8 % (ref 0.0–3.0)
Eosinophils Absolute: 0.4 10*3/uL (ref 0.0–0.7)
Eosinophils Relative: 5.4 % — ABNORMAL HIGH (ref 0.0–5.0)
HCT: 46.4 % (ref 39.0–52.0)
Hemoglobin: 15.5 g/dL (ref 13.0–17.0)
Lymphocytes Relative: 25.7 % (ref 12.0–46.0)
Lymphs Abs: 1.9 10*3/uL (ref 0.7–4.0)
MCHC: 33.4 g/dL (ref 30.0–36.0)
MCV: 90.7 fl (ref 78.0–100.0)
Monocytes Absolute: 0.5 10*3/uL (ref 0.1–1.0)
Monocytes Relative: 6.6 % (ref 3.0–12.0)
Neutro Abs: 4.6 10*3/uL (ref 1.4–7.7)
Neutrophils Relative %: 61.5 % (ref 43.0–77.0)
Platelets: 253 10*3/uL (ref 150.0–400.0)
RBC: 5.12 Mil/uL (ref 4.22–5.81)
RDW: 12.5 % (ref 11.5–15.5)
WBC: 7.4 10*3/uL (ref 4.0–10.5)

## 2014-12-10 LAB — LIPID PANEL
Cholesterol: 193 mg/dL (ref 0–200)
HDL: 47.3 mg/dL (ref >39.00)
LDL Cholesterol: 132 mg/dL — ABNORMAL HIGH (ref 0–99)
NonHDL: 145.59
Total CHOL/HDL Ratio: 4
Triglycerides: 70 mg/dL (ref 0.0–149.0)
VLDL: 14 mg/dL (ref 0.0–40.0)

## 2014-12-10 LAB — BASIC METABOLIC PANEL
BUN: 15 mg/dL (ref 6–23)
CO2: 25 mEq/L (ref 19–32)
Calcium: 9.4 mg/dL (ref 8.4–10.5)
Chloride: 105 mEq/L (ref 96–112)
Creatinine, Ser: 1.08 mg/dL (ref 0.40–1.50)
GFR: 76.75 mL/min (ref >60.00)
Glucose, Bld: 98 mg/dL (ref 70–99)
Potassium: 4.5 mEq/L (ref 3.5–5.1)
Sodium: 140 mEq/L (ref 135–145)

## 2014-12-10 LAB — HEPATIC FUNCTION PANEL
ALT: 19 U/L (ref 0–53)
AST: 18 U/L (ref 0–37)
Albumin: 4.1 g/dL (ref 3.5–5.2)
Alkaline Phosphatase: 60 U/L (ref 39–117)
Bilirubin, Direct: 0.1 mg/dL (ref 0.0–0.3)
Total Bilirubin: 0.4 mg/dL (ref 0.2–1.2)
Total Protein: 6.6 g/dL (ref 6.0–8.3)

## 2014-12-10 LAB — PSA: PSA: 0.9 ng/mL (ref 0.10–4.00)

## 2014-12-10 LAB — TSH: TSH: 1 u[IU]/mL (ref 0.35–4.50)

## 2014-12-16 ENCOUNTER — Ambulatory Visit (INDEPENDENT_AMBULATORY_CARE_PROVIDER_SITE_OTHER): Payer: BLUE CROSS/BLUE SHIELD | Admitting: Family Medicine

## 2014-12-16 ENCOUNTER — Encounter: Payer: Self-pay | Admitting: Family Medicine

## 2014-12-16 VITALS — BP 120/90 | HR 89 | Temp 97.7°F | Resp 16 | Ht 72.0 in | Wt 194.8 lb

## 2014-12-16 DIAGNOSIS — Z0000 Encounter for general adult medical examination without abnormal findings: Secondary | ICD-10-CM | POA: Diagnosis not present

## 2014-12-16 MED ORDER — CLONAZEPAM 1 MG PO TABS: 1.0000 mg | ORAL_TABLET | Freq: Two times a day (BID) | ORAL | Status: DC

## 2014-12-16 MED ORDER — TADALAFIL 5 MG PO TABS: 5.0000 mg | ORAL_TABLET | Freq: Every day | ORAL | Status: DC

## 2014-12-16 NOTE — Patient Instructions (Signed)
Smoking Cessation, Tips for Success If you are ready to quit smoking, congratulations! You have chosen to help yourself be healthier. Cigarettes bring nicotine, tar, carbon monoxide, and other irritants into your body. Your lungs, heart, and blood vessels will be able to work better without these poisons. There are many different ways to quit smoking. Nicotine gum, nicotine patches, a nicotine inhaler, or nicotine nasal spray can help with physical craving. Hypnosis, support groups, and medicines help break the habit of smoking. WHAT THINGS CAN I DO TO MAKE QUITTING EASIER?  Here are some tips to help you quit for good:  Pick a date when you will quit smoking completely. Tell all of your friends and family about your plan to quit on that date.  Do not try to slowly cut down on the number of cigarettes you are smoking. Pick a quit date and quit smoking completely starting on that day.  Throw away all cigarettes.   Clean and remove all ashtrays from your home, work, and car.  On a card, write down your reasons for quitting. Carry the card with you and read it when you get the urge to smoke.  Cleanse your body of nicotine. Drink enough water and fluids to keep your urine clear or pale yellow. Do this after quitting to flush the nicotine from your body.  Learn to predict your moods. Do not let a bad situation be your excuse to have a cigarette. Some situations in your life might tempt you into wanting a cigarette.  Never have "just one" cigarette. It leads to wanting another and another. Remind yourself of your decision to quit.  Change habits associated with smoking. If you smoked while driving or when feeling stressed, try other activities to replace smoking. Stand up when drinking your coffee. Brush your teeth after eating. Sit in a different chair when you read the paper. Avoid alcohol while trying to quit, and try to drink fewer caffeinated beverages. Alcohol and caffeine may urge you to  smoke.  Avoid foods and drinks that can trigger a desire to smoke, such as sugary or spicy foods and alcohol.  Ask people who smoke not to smoke around you.  Have something planned to do right after eating or having a cup of coffee. For example, plan to take a walk or exercise.  Try a relaxation exercise to calm you down and decrease your stress. Remember, you may be tense and nervous for the first 2 weeks after you quit, but this will pass.  Find new activities to keep your hands busy. Play with a pen, coin, or rubber band. Doodle or draw things on paper.  Brush your teeth right after eating. This will help cut down on the craving for the taste of tobacco after meals. You can also try mouthwash.   Use oral substitutes in place of cigarettes. Try using lemon drops, carrots, cinnamon sticks, or chewing gum. Keep them handy so they are available when you have the urge to smoke.  When you have the urge to smoke, try deep breathing.  Designate your home as a nonsmoking area.  If you are a heavy smoker, ask your health care provider about a prescription for nicotine chewing gum. It can ease your withdrawal from nicotine.  Reward yourself. Set aside the cigarette money you save and buy yourself something nice.  Look for support from others. Join a support group or smoking cessation program. Ask someone at home or at work to help you with your plan   to quit smoking.  Always ask yourself, "Do I need this cigarette or is this just a reflex?" Tell yourself, "Today, I choose not to smoke," or "I do not want to smoke." You are reminding yourself of your decision to quit.  Do not replace cigarette smoking with electronic cigarettes (commonly called e-cigarettes). The safety of e-cigarettes is unknown, and some may contain harmful chemicals.  If you relapse, do not give up! Plan ahead and think about what you will do the next time you get the urge to smoke. HOW WILL I FEEL WHEN I QUIT SMOKING? You  may have symptoms of withdrawal because your body is used to nicotine (the addictive substance in cigarettes). You may crave cigarettes, be irritable, feel very hungry, cough often, get headaches, or have difficulty concentrating. The withdrawal symptoms are only temporary. They are strongest when you first quit but will go away within 10-14 days. When withdrawal symptoms occur, stay in control. Think about your reasons for quitting. Remind yourself that these are signs that your body is healing and getting used to being without cigarettes. Remember that withdrawal symptoms are easier to treat than the major diseases that smoking can cause.  Even after the withdrawal is over, expect periodic urges to smoke. However, these cravings are generally short lived and will go away whether you smoke or not. Do not smoke! WHAT RESOURCES ARE AVAILABLE TO HELP ME QUIT SMOKING? Your health care provider can direct you to community resources or hospitals for support, which may include:  Group support.  Education.  Hypnosis.  Therapy.   This information is not intended to replace advice given to you by your health care provider. Make sure you discuss any questions you have with your health care provider.   Document Released: 09/16/2003 Document Revised: 01/08/2014 Document Reviewed: 06/05/2012 Elsevier Interactive Patient Education 2016 [employer]  

## 2014-12-16 NOTE — Progress Notes (Signed)
Pre visit review using our clinic review tool, if applicable. No additional management support is needed unless otherwise documented below in the visit note. 

## 2014-12-16 NOTE — Progress Notes (Signed)
   Subjective:    Patient ID: [name] [name], male    DOB: 05/04/1965, 49 y.o.   MRN: [id number]  HPI Patient seen for complete physical. His past history includes history of GERD, ED, chronic anxiety, dyslipidemia Still smokes about half pack of cigarettes per day. Low motivation to quit. Turned 49 this year. No prior screening colonoscopy. He had tetanus booster he thinks earlier this year last spring with injury but he will confirm. Declines flu vaccine  Past Medical History  Diagnosis Date  . Acute bronchitis 03/31/2009  . ANXIETY 11/10/2008  . EPIGASTRIC PAIN 12/08/2009  . 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 Other and unspecified hyperlipidemia 12/29/2008   No past surgical history on file.  reports that he has been smoking Cigarettes.  He has a 8 pack-year smoking history. He has never used smokeless tobacco. He reports that he drinks alcohol. He reports that he does not use illicit drugs. family history includes Cancer in his mother; Heart disease in his paternal aunt and paternal uncle; Heart disease (age of onset: 54) in his father. There is no history of Diabetes or Stroke. No Known Allergies    Review of Systems  Constitutional: Negative for fever, activity change, appetite change and fatigue.  HENT: Negative for congestion, ear pain and trouble swallowing.   Eyes: Negative for pain and visual disturbance.  Respiratory: Negative for cough, shortness of breath and wheezing.   Cardiovascular: Negative for chest pain and palpitations.  Gastrointestinal: Negative for nausea, vomiting, abdominal pain, diarrhea, constipation, blood in stool, abdominal distention and rectal pain.  Genitourinary: Negative for dysuria, hematuria and testicular pain.  Musculoskeletal: Negative for joint swelling and arthralgias.  Skin: Negative for rash.  Neurological: Negative for dizziness, syncope and headaches.  Hematological: Negative for adenopathy.  Psychiatric/Behavioral: Negative for  confusion and dysphoric mood.       Objective:   Physical Exam  Constitutional: He is oriented to person, place, and time. He appears well-developed and well-nourished. No distress.  HENT:  Head: Normocephalic and atraumatic.  Right Ear: External ear normal.  Left Ear: External ear normal.  Mouth/Throat: Oropharynx is clear and moist.  Eyes: Conjunctivae and EOM are normal. Pupils are equal, round, and reactive to light.  Neck: Normal range of motion. Neck supple. No thyromegaly present.  Cardiovascular: Normal rate, regular rhythm and normal heart sounds.   No murmur heard. Pulmonary/Chest: No respiratory distress. He has no wheezes. He has no rales.  Abdominal: Soft. Bowel sounds are normal. He exhibits no distension and no mass. There is no tenderness. There is no rebound and no guarding.  Musculoskeletal: He exhibits no edema.  Lymphadenopathy:    He has no cervical adenopathy.  Neurological: He is alert and oriented to person, place, and time. He displays normal reflexes. No cranial nerve deficit.  Skin: No rash noted.  Psychiatric: He has a normal mood and affect.          Assessment & Plan:  Complete physical. We discussed smoking cessation at some length. Patient has low motivation to quit but will consider. Handout given. Schedule screening colonoscopy. Labs reviewed. 10 year risk of CAD event 7%. He declines further statin use. Flu vaccine recommended and patient declines

## 2014-12-17 ENCOUNTER — Encounter: Payer: Self-pay | Admitting: Gastroenterology

## 2015-02-02 ENCOUNTER — Other Ambulatory Visit: Payer: Self-pay | Admitting: Family Medicine

## 2015-02-09 ENCOUNTER — Ambulatory Visit (AMBULATORY_SURGERY_CENTER): Payer: Self-pay

## 2015-02-09 VITALS — Ht 71.0 in | Wt 196.8 lb

## 2015-02-09 DIAGNOSIS — Z1211 Encounter for screening for malignant neoplasm of colon: Secondary | ICD-10-CM

## 2015-02-09 MED ORDER — SUPREP BOWEL PREP KIT 17.5-3.13-1.6 GM/177ML PO SOLN: 1.0000 | Freq: Once | ORAL | Status: DC

## 2015-02-09 NOTE — Progress Notes (Signed)
No allergies to eggs or soy No past problems with anesthesia No diet/weight loss meds No home oxygen  Has email and internet; registered for emmi

## 2015-02-21 ENCOUNTER — Ambulatory Visit (AMBULATORY_SURGERY_CENTER): Payer: BLUE CROSS/BLUE SHIELD | Admitting: Gastroenterology

## 2015-02-21 ENCOUNTER — Encounter: Payer: Self-pay | Admitting: Gastroenterology

## 2015-02-21 VITALS — BP 106/74 | HR 66 | Temp 98.1°F | Resp 16 | Ht 71.0 in | Wt 196.0 lb

## 2015-02-21 DIAGNOSIS — D128 Benign neoplasm of rectum: Secondary | ICD-10-CM

## 2015-02-21 DIAGNOSIS — Z1211 Encounter for screening for malignant neoplasm of colon: Secondary | ICD-10-CM

## 2015-02-21 DIAGNOSIS — D129 Benign neoplasm of anus and anal canal: Secondary | ICD-10-CM

## 2015-02-21 DIAGNOSIS — D123 Benign neoplasm of transverse colon: Secondary | ICD-10-CM

## 2015-02-21 DIAGNOSIS — K621 Rectal polyp: Secondary | ICD-10-CM

## 2015-02-21 MED ORDER — SODIUM CHLORIDE 0.9 % IV SOLN: 500.0000 mL | INTRAVENOUS | Status: DC

## 2015-02-21 NOTE — Progress Notes (Signed)
Called to room to assist during endoscopic procedure.  Patient ID and intended procedure confirmed with present staff. Received instructions for my participation in the procedure from the performing physician.  

## 2015-02-21 NOTE — Progress Notes (Signed)
Report to PACU, RN, vss, BBS= Clear.  

## 2015-02-21 NOTE — Op Note (Signed)
[place]  [address] [place], [id number]   COLONOSCOPY PROCEDURE REPORT  PATIENT: [name], [name]  MR#: [id number] BIRTHDATE: 03/14/1965 , 49  yrs. old GENDER: male ENDOSCOPIST: Milus Banister, MD REFERRED [id number] Elease Hashimoto, M.D. PROCEDURE DATE:  02/21/2015 PROCEDURE:   Colonoscopy, screening and Colonoscopy with snare polypectomy First Screening Colonoscopy - Avg.  risk and is 50 yrs.  old or older Yes.  Prior Negative Screening - Now for repeat screening. N/A  History of Adenoma - Now for follow-up colonoscopy & has been > or = to 3 yrs.  N/A  Polyps removed today? Yes ASA CLASS:   Class II INDICATIONS:Screening for colonic neoplasia and Colorectal Neoplasm Risk Assessment for this procedure is average risk. MEDICATIONS: Monitored anesthesia care and Propofol 300 mg IV  DESCRIPTION OF PROCEDURE:   After the risks benefits and alternatives of the procedure were thoroughly explained, informed consent was obtained.  The digital rectal exam revealed no abnormalities of the rectum.   The [id number]  endoscope was introduced through the anus and advanced to the cecum, which was identified by both the appendix and ileocecal valve. No adverse events experienced.   The quality of the prep was excellent.  The instrument was then slowly withdrawn as the colon was fully examined. Estimated blood loss is zero unless otherwise noted in this procedure report.   COLON FINDINGS: Three polyps were found, removed and sent to pathology.  These were all sessile, 2-44mm across, located in transverse and rectal segments, removed with cold snare.  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  The examination was otherwise normal.  Retroflexed views revealed no abnormalities. The time to cecum = 3.2 Withdrawal time = 15.2   The scope was withdrawn and the procedure completed. COMPLICATIONS: There were no immediate complications.  ENDOSCOPIC IMPRESSION: Three  polyps were found, removed and sent to pathology. 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 The examination was otherwise normal.  RECOMMENDATIONS: If the polyp(s) removed today are proven to be adenomatous (pre-cancerous) polyps, you will need a colonoscopy in 3-5 years. Otherwise you should continue to follow colorectal cancer screening guidelines for "routine risk" patients with a colonoscopy in 10 years.  You will receive a letter within 1-2 weeks with the results of your biopsy as well as final recommendations.  Please call my office if you have not received a letter after 3 weeks.  eSigned:  Milus Banister, MD 02/21/2015 8:34 AM

## 2015-02-21 NOTE — Patient Instructions (Signed)
YOU HAD AN ENDOSCOPIC PROCEDURE TODAY AT [facility]:   Refer to the procedure report that was given to you for any specific questions about what was found during the examination.  If the procedure report does not answer your questions, please call your gastroenterologist to clarify.  If you requested that your care partner not be given the details of your procedure findings, then the procedure report has been included in a sealed envelope for you to review at your convenience later.  YOU SHOULD EXPECT: Some feelings of bloating in the abdomen. Passage of more gas than usual.  Walking can help get rid of the air that was put into your GI tract during the procedure and reduce the bloating. If you had a lower endoscopy (such as a colonoscopy or flexible sigmoidoscopy) you may notice spotting of blood in your stool or on the toilet paper. If you underwent a bowel prep for your procedure, you may not have a normal bowel movement for a few days.  Please Note:  You might notice some irritation and congestion in your nose or some drainage.  This is from the oxygen used during your procedure.  There is no need for concern and it should clear up in a day or so.  SYMPTOMS TO REPORT IMMEDIATELY:   Following lower endoscopy (colonoscopy or flexible sigmoidoscopy):  Excessive amounts of blood in the stool  Significant tenderness or worsening of abdominal pains  Swelling of the abdomen that is new, acute  Fever of 100F or higher   For urgent or emergent issues, a gastroenterologist can be reached at any hour by calling [phone].   DIET: Your first meal following the procedure should be a small meal and then it is ok to progress to your normal diet. Heavy or fried foods are harder to digest and may make you feel nauseous or bloated.  Likewise, meals heavy in dairy and vegetables can increase bloating.  Drink plenty of fluids but you should avoid alcoholic beverages for 24  hours.  ACTIVITY:  You should plan to take it easy for the rest of today and you should NOT DRIVE or use heavy machinery until tomorrow (because of the sedation medicines used during the test).    FOLLOW UP: Our staff will call the number listed on your records the next business day following your procedure to check on you and address any questions or concerns that you may have regarding the information given to you following your procedure. If we do not reach you, we will leave a message.  However, if you are feeling well and you are not experiencing any problems, there is no need to return our call.  We will assume that you have returned to your regular daily activities without incident.  If any biopsies were taken you will be contacted by phone or by letter within the next 1-3 weeks.  Please call us at [phone] if you have not heard about the biopsies in 3 weeks.    SIGNATURES/CONFIDENTIALITY: You and/or your care partner have signed paperwork which will be entered into your electronic medical record.  These signatures attest to the fact that that the information above on your After Visit Summary has been reviewed and is understood.  Full responsibility of the confidentiality of this discharge information lies with you and/or your care-partner.  Please read all handouts given by your recovery Rn. Thank you for letting us take care of your healthcare needs.

## 2015-02-22 ENCOUNTER — Telehealth: Payer: Self-pay

## 2015-02-22 NOTE — Telephone Encounter (Signed)
Left a message for the pt at [phone] for the pt to call us back if any questions or concerns. maw

## 2015-02-28 ENCOUNTER — Encounter: Payer: Self-pay | Admitting: Gastroenterology

## 2015-05-04 ENCOUNTER — Other Ambulatory Visit: Payer: Self-pay | Admitting: Family Medicine

## 2015-06-22 ENCOUNTER — Ambulatory Visit: Payer: BLUE CROSS/BLUE SHIELD | Admitting: Family Medicine

## 2015-06-29 ENCOUNTER — Encounter: Payer: Self-pay | Admitting: Family Medicine

## 2015-06-29 ENCOUNTER — Ambulatory Visit (INDEPENDENT_AMBULATORY_CARE_PROVIDER_SITE_OTHER): Payer: BLUE CROSS/BLUE SHIELD | Admitting: Family Medicine

## 2015-06-29 VITALS — BP 112/86 | HR 81 | Temp 98.1°F | Ht 71.0 in | Wt 197.0 lb

## 2015-06-29 DIAGNOSIS — M542 Cervicalgia: Secondary | ICD-10-CM | POA: Diagnosis not present

## 2015-06-29 NOTE — Patient Instructions (Signed)
Seborrheic Keratosis Seborrheic keratosis is a common, noncancerous (benign) skin growth. This condition causes waxy, rough, tan, brown, or black spots to appear on the skin. These skin growths can be flat or raised. CAUSES The cause of this condition is not known. RISK FACTORS This condition is more likely to develop in:  People who have a family history of seborrheic keratosis.  People who are 50 or older.  People who are pregnant.  People who have had estrogen replacement therapy. SYMPTOMS This condition often occurs on the face, chest, shoulders, back, or other areas. These growths:  Are usually painless, but may become irritated and itchy.  Can be yellow, brown, black, or other colors.  Are slightly raised or have a flat surface.  Are sometimes rough or wart-like in texture.  Are often waxy on the surface.  Are round or oval-shaped.  Sometimes look like they are "stuck on."  Often occur in groups, but may occur as a single growth. DIAGNOSIS This condition is diagnosed with a medical history and physical exam. A sample of the growth may be tested (skin biopsy). You may need to see a skin specialist (dermatologist). TREATMENT Treatment is not usually needed for this condition, unless the growths are irritated or are often bleeding. You may also choose to have the growths removed if you do not like their appearance. Most commonly, these growths are treated with a procedure in which liquid nitrogen is applied to "freeze" off the growth (cryosurgery). They may also be burned off with electricity or cut off. HOME CARE INSTRUCTIONS  Watch your growth for any changes.  Keep all follow-up visits as told by your health care provider. This is important.  Do not scratch or pick at the growth or growths. This can cause them to become irritated or infected. SEEK MEDICAL CARE IF:  You suddenly have many new growths.  Your growth bleeds, itches, or hurts.  Your growth suddenly  becomes larger or changes color.   This information is not intended to replace advice given to you by your health care provider. Make sure you discuss any questions you have with your health care provider.   Document Released: 01/20/2010 Document Revised: 09/08/2014 Document Reviewed: 05/05/2014 Elsevier Interactive Patient Education 2016 Elsevier Inc.  

## 2015-06-29 NOTE — Progress Notes (Signed)
   Subjective:    Patient ID: [name] [name], male    DOB: 09-15-1965, 50 y.o.   MRN: [id number]  HPI Patient seen with left cervical neck pain. He was having some mild pain for several months but 2 weeks ago stood up into across been. No loss of consciousness. He noticed some worsening of neck pain since then. He has no radiculopathy symptoms. Pain is worse first thing in the morning and then improves through the day. He's been taking over-the-counter aspirin with fairly good relief. No numbness or weakness. Good range of motion neck.  Past Medical History  Diagnosis Date  . Acute bronchitis 03/31/2009  . ANXIETY 11/10/2008  . EPIGASTRIC PAIN 12/08/2009  . 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 Other and unspecified hyperlipidemia 12/29/2008  . Arthritis   . GERD (gastroesophageal reflux disease)    Past Surgical History  Procedure Laterality Date  . Wisdom tooth extraction  1989    reports that he has been smoking Cigarettes.  He has a 8 pack-year smoking history. He has never used smokeless tobacco. He reports that he drinks about 1.8 oz of alcohol per week. He reports that he does not use illicit drugs. family history includes Cancer in his mother; Diabetes in his brother; Heart disease in his paternal aunt and paternal uncle; Heart disease (age of onset: 55) in his father. There is no history of Stroke or Colon cancer. No Known Allergies    Review of Systems  Cardiovascular: Negative for chest pain.  Musculoskeletal: Positive for neck pain.  Neurological: Negative for weakness and numbness.       Objective:   Physical Exam  Constitutional: He appears well-developed and well-nourished.  Cardiovascular: Normal rate and regular rhythm.   Pulmonary/Chest: Effort normal and breath sounds normal. No respiratory distress. He has no wheezes. He has no rales.  Musculoskeletal:  Excellent range of motion cervical spine. No spinal tenderness. Full range of motion left shoulder.    Neurological:  Full strength upper extremities. Normal sensory function throughout. Symmetric reflexes.          Assessment & Plan:  Left cervical neck pain. Nonfocal exam neurologically. Suspect he has some degenerative arthritis with recent exacerbation. Avoid regular use of nonsteroidals if possible. Try Tylenol as needed. At this point his pain is not limiting him in most activities. Observe for now. Follow-up promptly for any numbness or weakness or worsening pain  [name] MD [facility] at [facility]

## 2015-06-29 NOTE — Progress Notes (Signed)
Pre visit review using our clinic review tool, if applicable. No additional management support is needed unless otherwise documented below in the visit note. 

## 2015-07-12 ENCOUNTER — Encounter: Payer: Self-pay | Admitting: Family Medicine

## 2015-07-12 ENCOUNTER — Ambulatory Visit (INDEPENDENT_AMBULATORY_CARE_PROVIDER_SITE_OTHER): Payer: BLUE CROSS/BLUE SHIELD | Admitting: Family Medicine

## 2015-07-12 VITALS — BP 120/80 | HR 92 | Temp 98.3°F | Ht 71.0 in | Wt 193.0 lb

## 2015-07-12 DIAGNOSIS — R0789 Other chest pain: Secondary | ICD-10-CM | POA: Diagnosis not present

## 2015-07-12 NOTE — Patient Instructions (Signed)
Nonspecific Chest Pain  °Chest pain can be caused by many different conditions. There is always a chance that your pain could be related to something serious, such as a heart attack or a blood clot in your lungs. Chest pain can also be caused by conditions that are not life-threatening. If you have chest pain, it is very important to follow up with your health care provider. °CAUSES  °Chest pain can be caused by: °· Heartburn. °· Pneumonia or bronchitis. °· Anxiety or stress. °· Inflammation around your heart (pericarditis) or lung (pleuritis or pleurisy). °· A blood clot in your lung. °· A collapsed lung (pneumothorax). It can develop suddenly on its own (spontaneous pneumothorax) or from trauma to the chest. °· Shingles infection (varicella-zoster virus). °· Heart attack. °· Damage to the bones, muscles, and cartilage that make up your chest wall. This can include: °¨ Bruised bones due to injury. °¨ Strained muscles or cartilage due to frequent or repeated coughing or overwork. °¨ Fracture to one or more ribs. °¨ Sore cartilage due to inflammation (costochondritis). °RISK FACTORS  °Risk factors for chest pain may include: °· Activities that increase your risk for trauma or injury to your chest. °· Respiratory infections or conditions that cause frequent coughing. °· Medical conditions or overeating that can cause heartburn. °· Heart disease or family history of heart disease. °· Conditions or health behaviors that increase your risk of developing a blood clot. °· Having had chicken pox (varicella zoster). °SIGNS AND SYMPTOMS °Chest pain can feel like: °· Burning or tingling on the surface of your chest or deep in your chest. °· Crushing, pressure, aching, or squeezing pain. °· Dull or sharp pain that is worse when you move, cough, or take a deep breath. °· Pain that is also felt in your back, neck, shoulder, or arm, or pain that spreads to any of these areas. °Your chest pain may come and go, or it may stay  constant. °DIAGNOSIS °Lab tests or other studies may be needed to find the cause of your pain. Your health care provider may have you take a test called an ambulatory ECG (electrocardiogram). An ECG records your heartbeat patterns at the time the test is performed. You may also have other tests, such as: °· Transthoracic echocardiogram (TTE). During echocardiography, sound waves are used to create a picture of all of the heart structures and to look at how blood flows through your heart. °· Transesophageal echocardiogram (TEE). This is a more advanced imaging test that obtains images from inside your body. It allows your health care provider to see your heart in finer detail. °· Cardiac monitoring. This allows your health care provider to monitor your heart rate and rhythm in real time. °· Holter monitor. This is a portable device that records your heartbeat and can help to diagnose abnormal heartbeats. It allows your health care provider to track your heart activity for several days, if needed. °· Stress tests. These can be done through exercise or by taking medicine that makes your heart beat more quickly. °· Blood tests. °· Imaging tests. °TREATMENT  °Your treatment depends on what is causing your chest pain. Treatment may include: °· Medicines. These may include: °¨ Acid blockers for heartburn. °¨ Anti-inflammatory medicine. °¨ Pain medicine for inflammatory conditions. °¨ Antibiotic medicine, if an infection is present. °¨ Medicines to dissolve blood clots. °¨ Medicines to treat coronary artery disease. °· Supportive care for conditions that do not require medicines. This may include: °¨ Resting. °¨ Applying heat   or cold packs to injured areas. °¨ Limiting activities until pain decreases. °HOME CARE INSTRUCTIONS °· If you were prescribed an antibiotic medicine, finish it all even if you start to feel better. °· Avoid any activities that bring on chest pain. °· Do not use any tobacco products, including  cigarettes, chewing tobacco, or electronic cigarettes. If you need help quitting, ask your health care provider. °· Do not drink alcohol. °· Take medicines only as directed by your health care provider. °· Keep all follow-up visits as directed by your health care provider. This is important. This includes any further testing if your chest pain does not go away. °· If heartburn is the cause for your chest pain, you may be told to keep your head raised (elevated) while sleeping. This reduces the chance that acid will go from your stomach into your esophagus. °· Make lifestyle changes as directed by your health care provider. These may include: °¨ Getting regular exercise. Ask your health care provider to suggest some activities that are safe for you. °¨ Eating a heart-healthy diet. A registered dietitian can help you to learn healthy eating options. °¨ Maintaining a healthy weight. °¨ Managing diabetes, if necessary. °¨ Reducing stress. °SEEK MEDICAL CARE IF: °· Your chest pain does not go away after treatment. °· You have a rash with blisters on your chest. °· You have a fever. °SEEK IMMEDIATE MEDICAL CARE IF:  °· Your chest pain is worse. °· You have an increasing cough, or you cough up blood. °· You have severe abdominal pain. °· You have severe weakness. °· You faint. °· You have chills. °· You have sudden, unexplained chest discomfort. °· You have sudden, unexplained discomfort in your arms, back, neck, or jaw. °· You have shortness of breath at any time. °· You suddenly start to sweat, or your skin gets clammy. °· You feel nauseous or you vomit. °· You suddenly feel light-headed or dizzy. °· Your heart begins to beat quickly, or it feels like it is skipping beats. °These symptoms may represent a serious problem that is an emergency. Do not wait to see if the symptoms will go away. Get medical help right away. Call your local emergency services (911 in the [place] Do not drive yourself to the hospital. °  °This  information is not intended to replace advice given to you by your health care provider. Make sure you discuss any questions you have with your health care provider. °  °Document Released: 09/27/2004 Document Revised: 01/08/2014 Document Reviewed: 07/24/2013 °Elsevier Interactive Patient Education ©2016 Elsevier Inc. ° °

## 2015-07-12 NOTE — Progress Notes (Signed)
   Subjective:    Patient ID: [name] [name], male    DOB: [birth year], 50 y.o.   MRN: [id number]  HPI Patient seen with fleeting atypical chest pain with infrequent episodes over the past several weeks. He recalls being up on a roof sometime in June on a very hot day and noticed some palpitations but no real chest pain and those symptoms improved after getting into the shade and drinking lots of fluids.  Around fourth of July he was swimming vigorously and noted about 30 seconds of left substernal chest pressure which promptly went away. He did not recall any nausea, vomiting, dyspnea, or any radiation of symptoms.  He states he had stress test but this was over 10 years ago by another physician which was normal. He is concerned because his father had bypass age 62 and recently 25 year old sister had a stent placed in her heart.  Patient has risk factors for CAD including ongoing nicotine use and dyslipidemia. No history of diabetes. Never had any chest pain at rest and no history of dyspnea with exertion. No recent active GERD symptoms  Past Medical History  Diagnosis Date  . Acute bronchitis 03/31/2009  . ANXIETY 11/10/2008  . EPIGASTRIC PAIN 12/08/2009  . 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 Other and unspecified hyperlipidemia 12/29/2008  . Arthritis   . GERD (gastroesophageal reflux disease)    Past Surgical History  Procedure Laterality Date  . Wisdom tooth extraction  1989    reports that he has been smoking Cigarettes.  He has a 8 pack-year smoking history. He has never used smokeless tobacco. He reports that he drinks about 1.8 oz of alcohol per week. He reports that he does not use illicit drugs. family history includes Cancer in his mother; Diabetes in his brother; Heart disease in his paternal aunt and paternal uncle; Heart disease (age of onset: 44) in his father. There is no history of Stroke or Colon cancer. No Known Allergies    Review of Systems  Constitutional:  Negative for fever, chills, appetite change and unexpected weight change.  Respiratory: Negative for shortness of breath.   Cardiovascular: Positive for chest pain (see history of present illness). Negative for palpitations and leg swelling.  Gastrointestinal: Negative for nausea, vomiting and abdominal pain.  Neurological: Negative for dizziness and syncope.       Objective:   Physical Exam  Constitutional: He appears well-developed and well-nourished.  Neck: Neck supple. No thyromegaly present.  Cardiovascular: Normal rate and regular rhythm.  Exam reveals no gallop.   No murmur heard. Pulmonary/Chest: Effort normal and breath sounds normal. No respiratory distress. He has no wheezes. He has no rales.  Musculoskeletal: He exhibits no edema.          Assessment & Plan:  Atypical chest pain. Patient had very fleeting symptoms but these have occurred with extreme exertion. No symptoms at rest. His risk factors for CAD include family history and ongoing nicotine use as well as dyslipidemia. Recommend nuclear stress test to further evaluate. Patient already takes aspirin daily. We have discussed statin use in the past but he declines EKG today shows sinus rhythm with no acute ST-T changes  [name] MD [facility] at [facility]

## 2015-07-12 NOTE — Progress Notes (Signed)
Pre visit review using our clinic review tool, if applicable. No additional management support is needed unless otherwise documented below in the visit note. 

## 2015-07-27 ENCOUNTER — Telehealth (HOSPITAL_COMMUNITY): Payer: Self-pay | Admitting: *Deleted

## 2015-07-27 NOTE — Telephone Encounter (Signed)
Patient given detailed instructions per Myocardial Perfusion Study Information Sheet for the test on  07/29/15 Patient notified to arrive 15 minutes early and that it is imperative to arrive on time for appointment to keep from having the test rescheduled.  If you need to cancel or reschedule your appointment, please call the office within 24 hours of your appointment. Failure to do so may result in a cancellation of your appointment, and a $50 no show fee. Patient verbalized understanding. Hubbard Robinson, RN

## 2015-07-29 ENCOUNTER — Ambulatory Visit (HOSPITAL_COMMUNITY): Payer: BLUE CROSS/BLUE SHIELD | Attending: Internal Medicine

## 2015-07-29 DIAGNOSIS — I10 Essential (primary) hypertension: Secondary | ICD-10-CM | POA: Insufficient documentation

## 2015-07-29 DIAGNOSIS — R9439 Abnormal result of other cardiovascular function study: Secondary | ICD-10-CM | POA: Insufficient documentation

## 2015-07-29 DIAGNOSIS — Z8249 Family history of ischemic heart disease and other diseases of the circulatory system: Secondary | ICD-10-CM | POA: Insufficient documentation

## 2015-07-29 DIAGNOSIS — R0789 Other chest pain: Secondary | ICD-10-CM | POA: Insufficient documentation

## 2015-07-29 LAB — MYOCARDIAL PERFUSION IMAGING
LV dias vol: 107 mL (ref 62–150)
LV sys vol: 46 mL
Peak HR: 123 {beats}/min
RATE: 0.28
Rest HR: 67 {beats}/min
SDS: 8
SRS: 5
SSS: 13
TID: 1.01

## 2015-07-29 MED ORDER — TECHNETIUM TC 99M TETROFOSMIN IV KIT
10.2000 | PACK | Freq: Once | INTRAVENOUS | Status: AC | PRN
  Administered 2015-07-29: 10 via INTRAVENOUS
  Filled 2015-07-29: qty 10

## 2015-07-29 MED ORDER — REGADENOSON 0.4 MG/5ML IV SOLN
0.4000 mg | Freq: Once | INTRAVENOUS | Status: AC
  Administered 2015-07-29: 0.4 mg via INTRAVENOUS

## 2015-07-29 MED ORDER — TECHNETIUM TC 99M TETROFOSMIN IV KIT
32.1000 | PACK | Freq: Once | INTRAVENOUS | Status: AC | PRN
  Administered 2015-07-29: 32.1 via INTRAVENOUS
  Filled 2015-07-29: qty 32

## 2015-08-01 ENCOUNTER — Telehealth: Payer: Self-pay | Admitting: Family Medicine

## 2015-08-01 NOTE — Telephone Encounter (Signed)
Can you check on this referral for me? Thank you! 

## 2015-08-01 NOTE — Telephone Encounter (Signed)
Dr. Elease Hashimoto pt would like for you to know that Cardiology has not called him as of now.

## 2015-08-02 ENCOUNTER — Other Ambulatory Visit: Payer: Self-pay

## 2015-08-02 DIAGNOSIS — R9439 Abnormal result of other cardiovascular function study: Secondary | ICD-10-CM

## 2015-08-04 NOTE — Progress Notes (Signed)
Cardiology Office Note   Date:  08/05/2015   ID:  [name] [name], DOB 1965-12-09, MRN [id number]  PCP:  [name], MD  Cardiologist:   Jenkins Rouge, MD   Chief Complaint  Patient presents with  . Establish Care    burchette referred to [place] due to a blockage, per pt      History of Present Illness: [name] [name] is a 50 y.o. male who presents for evaluation of chest pain. Seen by primary Dr Elease Hashimoto 07/12/15 With weeks of fleeting pains and some palpitations.   June: got too hot on roof and had palpitations improved with shade and hydration July: 30 seconds SSCP after swimming vigorously  Family history positive with Dad CABG age 42 and sister with stent at age 41 Other CRF;s smoking and elevated lipids but has not wanted to be on statins in past  07/29/15 myovue ordered and abnormal read by Dr Marlou Porch   Nuclear stress EF: 57%. No wall motion abnormalities  The left ventricular ejection fraction is normal (55-65%).  Horizontal ST segment depression ST segment depression of 1 mm was noted during stress in the V4 leads, and returning to baseline after less than 1 minute of recovery.  Defect 1: There is a medium defect of moderate severity present in the basal inferoseptal, basal inferior, mid inferior and apical inferior location.  This is an intermediate risk study.  Findings consistent with ischemia.  I reviewed the MPI images and would agree with reading   He continues to have some SSCP but thinks he is stressed about Stress test results   Past Medical History:  Diagnosis Date  . Acute bronchitis 03/31/2009  . ANXIETY 11/10/2008  . Arthritis   . EPIGASTRIC PAIN 12/08/2009  . GERD (gastroesophageal reflux disease)   . 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 Other and unspecified hyperlipidemia 12/29/2008    Past Surgical History:  Procedure Laterality Date  . WISDOM TOOTH EXTRACTION  1989     Current Outpatient Prescriptions  Medication Sig Dispense  Refill  . clonazePAM (KLONOPIN) 1 MG tablet Take 1 tablet (1 mg total) by mouth 2 (two) times daily. 60 tablet 5  . OVER THE COUNTER MEDICATION BC powder    . pantoprazole (PROTONIX) 40 MG tablet TAKE 1 TABLET BY MOUTH EVERY DAY 90 tablet 2  . tadalafil (CIALIS) 5 MG tablet Take 1 tablet (5 mg total) by mouth daily. 30 tablet 2   No current facility-administered medications for this visit.     Allergies:   Review of patient's allergies indicates no known allergies.    Social History:  The patient  reports that he has been smoking Cigarettes.  He has a 8.00 pack-year smoking history. He has never used smokeless tobacco. He reports that he drinks about 1.8 oz of alcohol per week . He reports that he does not use drugs.   Family History:  The patient's family history includes Cancer in his mother; Diabetes in his brother; Heart disease in his paternal aunt and paternal uncle; Heart disease (age of onset: 5) in his father.    ROS:  Please see the history of present illness.   Otherwise, review of systems are positive for none.   All other systems are reviewed and negative.    PHYSICAL EXAM: VS:  BP 133/89 (BP Location: Left Arm, Patient Position: Sitting, Cuff Size: Normal)   Pulse 79   Ht 6' (1.829 m)   Wt 191 lb 1.9 oz (86.7 kg)  SpO2 96%   BMI 25.92 kg/m  , BMI Body mass index is 25.92 kg/m. Affect appropriate Healthy:  appears stated age 42: normal Neck supple with no adenopathy JVP normal no bruits no thyromegaly Lungs clear with no wheezing and good diaphragmatic motion Heart:  S1/S2 no murmur, no rub, gallop or click PMI normal Abdomen: benighn, BS positve, no tenderness, no AAA no bruit.  No HSM or HJR Distal pulses intact with no bruits No edema Neuro non-focal Skin warm and dry No muscular weakness    EKG:  SR rate 81 normal    Recent Labs: 12/10/2014: ALT 19; BUN 15; Creatinine, Ser 1.08; Hemoglobin 15.5; Platelets 253.0; Potassium 4.5; Sodium 140; TSH  1.00    Lipid Panel    Component Value Date/Time   CHOL 193 12/10/2014 0757   TRIG 70.0 12/10/2014 0757   HDL 47.30 12/10/2014 0757   CHOLHDL 4 12/10/2014 0757   VLDL 14.0 12/10/2014 0757   LDLCALC 132 (H) 12/10/2014 0757   LDLDIRECT 157.6 12/21/2008 0831      Wt Readings from Last 3 Encounters:  08/05/15 191 lb 1.9 oz (86.7 kg)  07/12/15 193 lb (87.5 kg)  06/29/15 197 lb (89.4 kg)      Other studies Reviewed: Additional studies/ records that were reviewed today include: Primary care notes Labs and ECG myovue .    ASSESSMENT AND PLAN:  1. Chest Pain: with abnormal myovue and CRF;s smoking elevated lipids and positive family history Discussed cath with patient Risks including stroke, MI, bleeding and need for emergency CABG discussed willing to proceed. Scheduled with Dr Tamala Julian For 8/10 since I have no cath days next week.    2. Smoking:  Counseled for less than 10 minutes and discussed risk of cancer and vascular dx  3. Cholesterol:   Current medicines are reviewed at length with the patient today.  The patient does not have concerns regarding medicines.  The following changes have been made:  no change  Labs/ tests ordered today include:    Orders Placed This Encounter  Procedures  . CBC with Differential/Platelet  . Protime-INR  . Basic metabolic panel  . EKG 12-Lead     Disposition:   FU with me post caath     Signed, Jenkins Rouge, MD  08/05/2015 5:50 PM    [facility] [address], [place], [place]  [address] Phone: [phone]; Fax: [phone]

## 2015-08-05 ENCOUNTER — Encounter: Payer: Self-pay | Admitting: Cardiovascular Disease

## 2015-08-05 ENCOUNTER — Encounter (INDEPENDENT_AMBULATORY_CARE_PROVIDER_SITE_OTHER): Payer: Self-pay

## 2015-08-05 ENCOUNTER — Ambulatory Visit (INDEPENDENT_AMBULATORY_CARE_PROVIDER_SITE_OTHER): Payer: BLUE CROSS/BLUE SHIELD | Admitting: Cardiovascular Disease

## 2015-08-05 VITALS — BP 133/89 | HR 79 | Ht 72.0 in | Wt 191.1 lb

## 2015-08-05 DIAGNOSIS — Z7689 Persons encountering health services in other specified circumstances: Secondary | ICD-10-CM

## 2015-08-05 DIAGNOSIS — Z01812 Encounter for preprocedural laboratory examination: Secondary | ICD-10-CM

## 2015-08-05 DIAGNOSIS — Z7189 Other specified counseling: Secondary | ICD-10-CM

## 2015-08-05 LAB — CBC WITH DIFFERENTIAL/PLATELET
Basophils Absolute: 90 cells/uL (ref 0–200)
Basophils Relative: 1 %
Eosinophils Absolute: 360 cells/uL (ref 15–500)
Eosinophils Relative: 4 %
HCT: 44.2 % (ref 38.5–50.0)
Hemoglobin: 15.3 g/dL (ref 13.2–17.1)
Lymphocytes Relative: 32 %
Lymphs Abs: 2880 cells/uL (ref 850–3900)
MCH: 30.3 pg (ref 27.0–33.0)
MCHC: 34.6 g/dL (ref 32.0–36.0)
MCV: 87.5 fL (ref 80.0–100.0)
MPV: 9.5 fL (ref 7.5–12.5)
Monocytes Absolute: 810 cells/uL (ref 200–950)
Monocytes Relative: 9 %
Neutro Abs: 4860 cells/uL (ref 1500–7800)
Neutrophils Relative %: 54 %
Platelets: 244 10*3/uL (ref 140–400)
RBC: 5.05 MIL/uL (ref 4.20–5.80)
RDW: 13.4 % (ref 11.0–15.0)
WBC: 9 10*3/uL (ref 3.8–10.8)

## 2015-08-05 LAB — BASIC METABOLIC PANEL
BUN: 14 mg/dL (ref 7–25)
CO2: 25 mmol/L (ref 20–31)
Calcium: 9.5 mg/dL (ref 8.6–10.3)
Chloride: 102 mmol/L (ref 98–110)
Creat: 1.14 mg/dL (ref 0.70–1.33)
Glucose, Bld: 86 mg/dL (ref 65–99)
Potassium: 3.9 mmol/L (ref 3.5–5.3)
Sodium: 139 mmol/L (ref 135–146)

## 2015-08-05 NOTE — Patient Instructions (Addendum)
Medication Instructions:  Your physician recommends that you continue on your current medications as directed. Please refer to the Current Medication list given to you today.  Labwork: NONE  Testing/Procedures: Your physician has requested that you have a cardiac catheterization. Cardiac catheterization is used to diagnose and/or treat various heart conditions. Doctors may recommend this procedure for a number of different reasons. The most common reason is to evaluate chest pain. Chest pain can be a symptom of coronary artery disease (CAD), and cardiac catheterization can show whether plaque is narrowing or blocking your heart's arteries. This procedure is also used to evaluate the valves, as well as measure the blood flow and oxygen levels in different parts of your heart. For further information please visit [email or web address]. Please follow instruction sheet, as given.   Follow-Up: Your physician wants you to follow-up in: 6 months with Dr. [name]. You will receive a reminder letter in the mail two months in advance. If you don't receive a letter, please call our office to schedule the follow-up appointment.   If you need a refill on your cardiac medications before your next appointment, please call your pharmacy.

## 2015-08-06 LAB — PROTIME-INR
INR: 1
Prothrombin Time: 10.5 s (ref 9.0–11.5)

## 2015-08-08 ENCOUNTER — Other Ambulatory Visit: Payer: Self-pay | Admitting: Family Medicine

## 2015-08-08 NOTE — Telephone Encounter (Signed)
Pt requesting Clonazepam 1mg   Last filled 12/16/14 #60 Refills: 5   Last OV 07/12/15   Okay to refill?

## 2015-08-08 NOTE — Telephone Encounter (Signed)
Refill with 5 additional refills. 

## 2015-08-11 ENCOUNTER — Encounter (HOSPITAL_COMMUNITY)
Admission: RE | Disposition: A | Discharge status: Home / Self Care | Payer: Self-pay | Source: Ambulatory Visit | Attending: Interventional Cardiology

## 2015-08-11 ENCOUNTER — Ambulatory Visit (HOSPITAL_COMMUNITY)
Admission: RE | Admit: 2015-08-11 | Discharge: 2015-08-11 | Disposition: A | Discharge status: Home / Self Care | Payer: BLUE CROSS/BLUE SHIELD | Source: Ambulatory Visit | Attending: Interventional Cardiology | Admitting: Interventional Cardiology

## 2015-08-11 DIAGNOSIS — I209 Angina pectoris, unspecified: Secondary | ICD-10-CM

## 2015-08-11 DIAGNOSIS — M1990 Unspecified osteoarthritis, unspecified site: Secondary | ICD-10-CM | POA: Diagnosis not present

## 2015-08-11 DIAGNOSIS — Z833 Family history of diabetes mellitus: Secondary | ICD-10-CM | POA: Diagnosis not present

## 2015-08-11 DIAGNOSIS — F17210 Nicotine dependence, cigarettes, uncomplicated: Secondary | ICD-10-CM | POA: Diagnosis not present

## 2015-08-11 DIAGNOSIS — F419 Anxiety disorder, unspecified: Secondary | ICD-10-CM | POA: Insufficient documentation

## 2015-08-11 DIAGNOSIS — R9439 Abnormal result of other cardiovascular function study: Secondary | ICD-10-CM | POA: Diagnosis present

## 2015-08-11 DIAGNOSIS — E785 Hyperlipidemia, unspecified: Secondary | ICD-10-CM | POA: Diagnosis not present

## 2015-08-11 DIAGNOSIS — Z8249 Family history of ischemic heart disease and other diseases of the circulatory system: Secondary | ICD-10-CM | POA: Diagnosis not present

## 2015-08-11 DIAGNOSIS — R1013 Epigastric pain: Secondary | ICD-10-CM | POA: Diagnosis present

## 2015-08-11 DIAGNOSIS — I25119 Atherosclerotic heart disease of native coronary artery with unspecified angina pectoris: Secondary | ICD-10-CM | POA: Diagnosis not present

## 2015-08-11 DIAGNOSIS — K219 Gastro-esophageal reflux disease without esophagitis: Secondary | ICD-10-CM | POA: Insufficient documentation

## 2015-08-11 DIAGNOSIS — N521 Erectile dysfunction due to diseases classified elsewhere: Secondary | ICD-10-CM | POA: Insufficient documentation

## 2015-08-11 DIAGNOSIS — I2584 Coronary atherosclerosis due to calcified coronary lesion: Secondary | ICD-10-CM | POA: Diagnosis not present

## 2015-08-11 DIAGNOSIS — R002 Palpitations: Secondary | ICD-10-CM | POA: Diagnosis not present

## 2015-08-11 HISTORY — PX: CARDIAC CATHETERIZATION: SHX172

## 2015-08-11 SURGERY — LEFT HEART CATH AND CORONARY ANGIOGRAPHY
Anesthesia: LOCAL

## 2015-08-11 MED ORDER — LIDOCAINE HCL (PF) 1 % IJ SOLN
INTRAMUSCULAR | Status: AC
  Filled 2015-08-11: qty 30

## 2015-08-11 MED ORDER — HEPARIN (PORCINE) IN NACL 2-0.9 UNIT/ML-% IJ SOLN
INTRAMUSCULAR | Status: DC | PRN
  Administered 2015-08-11: 1000 mL via INTRA_ARTERIAL

## 2015-08-11 MED ORDER — SODIUM CHLORIDE 0.9% FLUSH: 3.0000 mL | Freq: Two times a day (BID) | INTRAVENOUS | Status: DC

## 2015-08-11 MED ORDER — FENTANYL CITRATE (PF) 100 MCG/2ML IJ SOLN
INTRAMUSCULAR | Status: AC
  Filled 2015-08-11: qty 2

## 2015-08-11 MED ORDER — VERAPAMIL HCL 2.5 MG/ML IV SOLN
INTRAVENOUS | Status: DC | PRN
  Administered 2015-08-11: 10 mL via INTRA_ARTERIAL

## 2015-08-11 MED ORDER — ATORVASTATIN CALCIUM 80 MG PO TABS: 80.0000 mg | ORAL_TABLET | Freq: Every day | ORAL | 5 refills | Status: DC

## 2015-08-11 MED ORDER — SODIUM CHLORIDE 0.9% FLUSH: 3.0000 mL | INTRAVENOUS | Status: DC | PRN

## 2015-08-11 MED ORDER — HEPARIN SODIUM (PORCINE) 1000 UNIT/ML IJ SOLN
INTRAMUSCULAR | Status: AC
  Filled 2015-08-11: qty 1

## 2015-08-11 MED ORDER — LIDOCAINE HCL (PF) 1 % IJ SOLN
INTRAMUSCULAR | Status: DC | PRN
  Administered 2015-08-11: 1 mL via INTRADERMAL

## 2015-08-11 MED ORDER — SODIUM CHLORIDE 0.9 % WEIGHT BASED INFUSION
3.0000 mL/kg/h | INTRAVENOUS | Status: AC
  Administered 2015-08-11: 3 mL/kg/h via INTRAVENOUS

## 2015-08-11 MED ORDER — IOPAMIDOL (ISOVUE-370) INJECTION 76%
INTRAVENOUS | Status: AC
  Filled 2015-08-11: qty 100

## 2015-08-11 MED ORDER — SODIUM CHLORIDE 0.9 % IV SOLN: 250.0000 mL | INTRAVENOUS | Status: DC | PRN

## 2015-08-11 MED ORDER — ASPIRIN 81 MG PO CHEW
81.0000 mg | CHEWABLE_TABLET | ORAL | Status: AC
  Administered 2015-08-11: 81 mg via ORAL

## 2015-08-11 MED ORDER — FENTANYL CITRATE (PF) 100 MCG/2ML IJ SOLN
INTRAMUSCULAR | Status: DC | PRN
  Administered 2015-08-11: 50 ug via INTRAVENOUS

## 2015-08-11 MED ORDER — ASPIRIN EC 81 MG PO TBEC: 81.0000 mg | DELAYED_RELEASE_TABLET | Freq: Every day | ORAL | Status: DC

## 2015-08-11 MED ORDER — HEPARIN (PORCINE) IN NACL 2-0.9 UNIT/ML-% IJ SOLN
INTRAMUSCULAR | Status: AC
  Filled 2015-08-11: qty 1000

## 2015-08-11 MED ORDER — HEPARIN SODIUM (PORCINE) 1000 UNIT/ML IJ SOLN
INTRAMUSCULAR | Status: DC | PRN
  Administered 2015-08-11: 4000 [IU] via INTRAVENOUS

## 2015-08-11 MED ORDER — SODIUM CHLORIDE 0.9 % IV SOLN: INTRAVENOUS | Status: DC

## 2015-08-11 MED ORDER — MIDAZOLAM HCL 2 MG/2ML IJ SOLN
INTRAMUSCULAR | Status: DC | PRN
  Administered 2015-08-11: 1 mg via INTRAVENOUS

## 2015-08-11 MED ORDER — SODIUM CHLORIDE 0.9 % WEIGHT BASED INFUSION: 1.0000 mL/kg/h | INTRAVENOUS | Status: DC

## 2015-08-11 MED ORDER — IOPAMIDOL (ISOVUE-370) INJECTION 76%
INTRAVENOUS | Status: DC | PRN
  Administered 2015-08-11: 90 mL via INTRA_ARTERIAL

## 2015-08-11 MED ORDER — ASPIRIN 81 MG PO CHEW
CHEWABLE_TABLET | ORAL | Status: AC
  Administered 2015-08-11: 81 mg via ORAL
  Filled 2015-08-11: qty 1

## 2015-08-11 MED ORDER — NITROGLYCERIN 0.4 MG SL SUBL: 0.4000 mg | SUBLINGUAL_TABLET | SUBLINGUAL | 99 refills | Status: DC | PRN

## 2015-08-11 MED ORDER — MIDAZOLAM HCL 2 MG/2ML IJ SOLN
INTRAMUSCULAR | Status: AC
  Filled 2015-08-11: qty 2

## 2015-08-11 MED ORDER — VERAPAMIL HCL 2.5 MG/ML IV SOLN
INTRAVENOUS | Status: AC
  Filled 2015-08-11: qty 2

## 2015-08-11 SURGICAL SUPPLY — 10 items
CATH INFINITI 5 FR JL3.5 (CATHETERS) ×2 IMPLANT
CATH INFINITI 5FR ANG PIGTAIL (CATHETERS) ×2 IMPLANT
CATH INFINITI JR4 5F (CATHETERS) ×2 IMPLANT
DEVICE RAD COMP TR BAND LRG (VASCULAR PRODUCTS) ×2 IMPLANT
GLIDESHEATH SLEND SS 6F .021 (SHEATH) ×2 IMPLANT
KIT HEART LEFT (KITS) ×2 IMPLANT
PACK CARDIAC CATHETERIZATION (CUSTOM PROCEDURE TRAY) ×2 IMPLANT
TRANSDUCER W/STOPCOCK (MISCELLANEOUS) ×2 IMPLANT
TUBING CIL FLEX 10 FLL-RA (TUBING) ×2 IMPLANT
WIRE SAFE-T 1.5MM-J .035X260CM (WIRE) ×2 IMPLANT

## 2015-08-11 NOTE — Discharge Instructions (Signed)
Radial Site Care °Refer to this sheet in the next few weeks. These instructions provide you with information about caring for yourself after your procedure. Your health care provider may also give you more specific instructions. Your treatment has been planned according to current medical practices, but problems sometimes occur. Call your health care provider if you have any problems or questions after your procedure. °WHAT TO EXPECT AFTER THE PROCEDURE °After your procedure, it is typical to have the following: °· Bruising at the radial site that usually fades within 1-2 weeks. °· Blood collecting in the tissue (hematoma) that may be painful to the touch. It should usually decrease in size and tenderness within 1-2 weeks. °HOME CARE INSTRUCTIONS °· Take medicines only as directed by your health care provider. °· You may shower 24-48 hours after the procedure or as directed by your health care provider. Remove the bandage (dressing) and gently wash the site with plain soap and water. Pat the area dry with a clean towel. Do not rub the site, because this may cause bleeding. °· Do not take baths, swim, or use a hot tub until your health care provider approves. °· Check your insertion site every day for redness, swelling, or drainage. °· Do not apply powder or lotion to the site. °· Do not flex or bend the affected arm for 24 hours or as directed by your health care provider. °· Do not push or pull heavy objects with the affected arm for 24 hours or as directed by your health care provider. °· Do not lift over 10 lb (4.5 kg) for 5 days after your procedure or as directed by your health care provider. °· Ask your health care provider when it is okay to: °¨ Return to work or school. °¨ Resume usual physical activities or sports. °¨ Resume sexual activity. °· Do not drive home if you are discharged the same day as the procedure. Have someone else drive you. °· You may drive 24 hours after the procedure unless otherwise  instructed by your health care provider. °· Do not operate machinery or power tools for 24 hours after the procedure. °· If your procedure was done as an outpatient procedure, which means that you went home the same day as your procedure, a responsible adult should be with you for the first 24 hours after you arrive home. °· Keep all follow-up visits as directed by your health care provider. This is important. °SEEK MEDICAL CARE IF: °· You have a fever. °· You have chills. °· You have increased bleeding from the radial site. Hold pressure on the site. °SEEK IMMEDIATE MEDICAL CARE IF: °· You have unusual pain at the radial site. °· You have redness, warmth, or swelling at the radial site. °· You have drainage (other than a small amount of blood on the dressing) from the radial site. °· The radial site is bleeding, and the bleeding does not stop after 30 minutes of holding steady pressure on the site. °· Your arm or hand becomes pale, cool, tingly, or numb. °  °This information is not intended to replace advice given to you by your health care provider. Make sure you discuss any questions you have with your health care provider. °  °Document Released: 01/20/2010 Document Revised: 01/08/2014 Document Reviewed: 07/06/2013 °Elsevier Interactive Patient Education ©2016 [employer] ° °

## 2015-08-11 NOTE — Interval H&P Note (Signed)
Cath Lab Visit (complete for each Cath Lab visit)  Clinical Evaluation Leading to the Procedure:   ACS: No.  Non-ACS:    Anginal Classification: CCS III  Anti-ischemic medical therapy: No Therapy  Non-Invasive Test Results: Intermediate-risk stress test findings: cardiac mortality 1-3%/year  Prior CABG: No previous CABG      History and Physical Interval Note:  08/11/2015 11:49 AM  [name] [name]  has presented today for surgery, with the diagnosis of cp, abnormal myoview  The various methods of treatment have been discussed with the patient and family. After consideration of risks, benefits and other options for treatment, the patient has consented to  Procedure(s): Left Heart Cath and Coronary Angiography (N/A) as a surgical intervention .  The patient's history has been reviewed, patient examined, no change in status, stable for surgery.  I have reviewed the patient's chart and labs.  Questions were answered to the patient's satisfaction.     [name]

## 2015-08-11 NOTE — H&P (View-Only) (Signed)
Cardiology Office Note   Date:  08/05/2015   ID:  [name] [name], DOB 1965/06/23, MRN [id number]  PCP:  [name], MD  Cardiologist:   Jenkins Rouge, MD   Chief Complaint  Patient presents with  . Establish Care    burchette referred to [place] due to a blockage, per pt      History of Present Illness: [name] [name] is a 50 y.o. male who presents for evaluation of chest pain. Seen by primary Dr Elease Hashimoto 07/12/15 With weeks of fleeting pains and some palpitations.   June: got too hot on roof and had palpitations improved with shade and hydration July: 30 seconds SSCP after swimming vigorously  Family history positive with Dad CABG age 31 and sister with stent at age 9 Other CRF;s smoking and elevated lipids but has not wanted to be on statins in past  07/29/15 myovue ordered and abnormal read by Dr Marlou Porch   Nuclear stress EF: 57%. No wall motion abnormalities  The left ventricular ejection fraction is normal (55-65%).  Horizontal ST segment depression ST segment depression of 1 mm was noted during stress in the V4 leads, and returning to baseline after less than 1 minute of recovery.  Defect 1: There is a medium defect of moderate severity present in the basal inferoseptal, basal inferior, mid inferior and apical inferior location.  This is an intermediate risk study.  Findings consistent with ischemia.  I reviewed the MPI images and would agree with reading   He continues to have some SSCP but thinks he is stressed about Stress test results   Past Medical History:  Diagnosis Date  . Acute bronchitis 03/31/2009  . ANXIETY 11/10/2008  . Arthritis   . EPIGASTRIC PAIN 12/08/2009  . GERD (gastroesophageal reflux disease)   . 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 Other and unspecified hyperlipidemia 12/29/2008    Past Surgical History:  Procedure Laterality Date  . WISDOM TOOTH EXTRACTION  1989     Current Outpatient Prescriptions  Medication Sig Dispense  Refill  . clonazePAM (KLONOPIN) 1 MG tablet Take 1 tablet (1 mg total) by mouth 2 (two) times daily. 60 tablet 5  . OVER THE COUNTER MEDICATION BC powder    . pantoprazole (PROTONIX) 40 MG tablet TAKE 1 TABLET BY MOUTH EVERY DAY 90 tablet 2  . tadalafil (CIALIS) 5 MG tablet Take 1 tablet (5 mg total) by mouth daily. 30 tablet 2   No current facility-administered medications for this visit.     Allergies:   Review of patient's allergies indicates no known allergies.    Social History:  The patient  reports that he has been smoking Cigarettes.  He has a 8.00 pack-year smoking history. He has never used smokeless tobacco. He reports that he drinks about 1.8 oz of alcohol per week . He reports that he does not use drugs.   Family History:  The patient's family history includes Cancer in his mother; Diabetes in his brother; Heart disease in his paternal aunt and paternal uncle; Heart disease (age of onset: 10) in his father.    ROS:  Please see the history of present illness.   Otherwise, review of systems are positive for none.   All other systems are reviewed and negative.    PHYSICAL EXAM: VS:  BP 133/89 (BP Location: Left Arm, Patient Position: Sitting, Cuff Size: Normal)   Pulse 79   Ht 6' (1.829 m)   Wt 191 lb 1.9 oz (86.7 kg)  SpO2 96%   BMI 25.92 kg/m  , BMI Body mass index is 25.92 kg/m. Affect appropriate Healthy:  appears stated age 62: normal Neck supple with no adenopathy JVP normal no bruits no thyromegaly Lungs clear with no wheezing and good diaphragmatic motion Heart:  S1/S2 no murmur, no rub, gallop or click PMI normal Abdomen: benighn, BS positve, no tenderness, no AAA no bruit.  No HSM or HJR Distal pulses intact with no bruits No edema Neuro non-focal Skin warm and dry No muscular weakness    EKG:  SR rate 81 normal    Recent Labs: 12/10/2014: ALT 19; BUN 15; Creatinine, Ser 1.08; Hemoglobin 15.5; Platelets 253.0; Potassium 4.5; Sodium 140; TSH  1.00    Lipid Panel    Component Value Date/Time   CHOL 193 12/10/2014 0757   TRIG 70.0 12/10/2014 0757   HDL 47.30 12/10/2014 0757   CHOLHDL 4 12/10/2014 0757   VLDL 14.0 12/10/2014 0757   LDLCALC 132 (H) 12/10/2014 0757   LDLDIRECT 157.6 12/21/2008 0831      Wt Readings from Last 3 Encounters:  08/05/15 191 lb 1.9 oz (86.7 kg)  07/12/15 193 lb (87.5 kg)  06/29/15 197 lb (89.4 kg)      Other studies Reviewed: Additional studies/ records that were reviewed today include: Primary care notes Labs and ECG myovue .    ASSESSMENT AND PLAN:  1. Chest Pain: with abnormal myovue and CRF;s smoking elevated lipids and positive family history Discussed cath with patient Risks including stroke, MI, bleeding and need for emergency CABG discussed willing to proceed. Scheduled with Dr Tamala Julian For 8/10 since I have no cath days next week.    2. Smoking:  Counseled for less than 10 minutes and discussed risk of cancer and vascular dx  3. Cholesterol:   Current medicines are reviewed at length with the patient today.  The patient does not have concerns regarding medicines.  The following changes have been made:  no change  Labs/ tests ordered today include:    Orders Placed This Encounter  Procedures  . CBC with Differential/Platelet  . Protime-INR  . Basic metabolic panel  . EKG 12-Lead     Disposition:   FU with me post caath     Signed, Jenkins Rouge, MD  08/05/2015 5:50 PM    [facility] [place], [place], [place]  [address] Phone: [phone]; Fax: [phone]

## 2015-08-11 NOTE — Research (Signed)
[facility] Study Informed Consent   Subject Name: [name] [name]  Subject met inclusion and exclusion criteria.  The informed consent form, study requirements and expectations were reviewed with the subject and questions and concerns were addressed prior to the signing of the consent form.  The subject verbalized understanding of the trial requirements.  The subject agreed to participate in the trial and signed the informed consent.  The informed consent was obtained prior to performance of any protocol-specific procedures for the subject.  A copy of the signed informed consent was given to the subject and a copy was placed in the subject's medical record.  [name] 08/11/2015, 10:29 AM

## 2015-08-12 ENCOUNTER — Telehealth: Payer: Self-pay | Admitting: Cardiovascular Disease

## 2015-08-12 ENCOUNTER — Other Ambulatory Visit: Payer: Self-pay | Admitting: *Deleted

## 2015-08-12 ENCOUNTER — Institutional Professional Consult (permissible substitution) (INDEPENDENT_AMBULATORY_CARE_PROVIDER_SITE_OTHER): Payer: BLUE CROSS/BLUE SHIELD | Admitting: Thoracic Surgery (Cardiothoracic Vascular Surgery)

## 2015-08-12 ENCOUNTER — Encounter (HOSPITAL_COMMUNITY): Payer: Self-pay | Admitting: Interventional Cardiology

## 2015-08-12 VITALS — BP 143/98 | HR 88 | Resp 16 | Ht 72.0 in | Wt 191.0 lb

## 2015-08-12 DIAGNOSIS — I2510 Atherosclerotic heart disease of native coronary artery without angina pectoris: Secondary | ICD-10-CM

## 2015-08-12 DIAGNOSIS — I209 Angina pectoris, unspecified: Secondary | ICD-10-CM

## 2015-08-12 NOTE — Telephone Encounter (Signed)
New message   Pt verbalized that Dr.Smith has referred him to Dr.Hendrixion and pt wants to speak to Dr.Nishan on his input on who he should go see

## 2015-08-12 NOTE — Telephone Encounter (Signed)
Patient is scheduled to have CABG on Wednesday. Patient would like Dr. Kyla Balzarine input on this situation. Informed patient that Dr. [name] and Dr. Tamala Julian are usually in agreement with such plan of care. Patient is aware that Dr. [name] is out of the office until Tuesday. Patient stated he just wants to know Dr. [name] opinion. Informed patient that message would be forward to Dr. [name]. Patient verbalized understanding.

## 2015-08-12 NOTE — Progress Notes (Signed)
PCP is [name], MD Referring Provider is Belva Crome, MD/ Cardiologist Jenkins Rouge, MD  Chief Complaint  Patient presents with  . Coronary Artery Disease    CATH 08/11/15, STRESS 07/29/15    HPI: [name] [name] is a 50 year old man sent for consultation for three-vessel coronary disease.  [name] [name] is a 50 year old man with a strong family history of coronary disease, borderline high cholesterol, and a history of tobacco abuse. He has no prior history of coronary disease. 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 This resolved with rest. He says that he has noted this before. On July 4 he was swimming and he felt a sensation of left-sided chest pressure and his heart racing. Because of his strong family history he mention this to Dr. Elease Hashimoto. He did a stress Myoview to further evaluate due to his family history. It showed a defect of moderate severity in the basal inferoseptal, basal inferior, mid inferior and apical inferior walls. It was interpreted as an intermediate risk study.  He saw Dr. [name]. He recommended cardiac catheterization. That was done yesterday by Dr. Tamala Julian. It showed severe diffuse three-vessel coronary disease with preserved left ventricular function.  He has not had any rest or nocturnal symptoms. In fact he has only had symptoms with very heavy exertion. He is in disbelief that his coronary disease is of the severity that was found. He does not have orthopnea, paroxysmal dyspnea, or peripheral edema. He has not had any syncope or presyncope. He denies claudication.   Past Medical History:  Diagnosis Date  . Acute bronchitis 03/31/2009  . ANXIETY 11/10/2008  . Arthritis   . EPIGASTRIC PAIN 12/08/2009  . GERD (gastroesophageal reflux disease)   . 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 Other and unspecified hyperlipidemia 12/29/2008    Past Surgical History:  Procedure Laterality Date   . CARDIAC CATHETERIZATION N/A 08/11/2015   Procedure: Left Heart Cath and Coronary Angiography;  Surgeon: Belva Crome, MD;  Location: [facility];  Service: Cardiovascular;  Laterality: N/A;  . WISDOM TOOTH EXTRACTION  1989    Family History  Problem Relation Age of Onset  . Cancer Mother     brain ca  . Heart disease Father 20    CABG  . Heart disease Paternal Aunt   . Heart disease Paternal Uncle   . Diabetes Brother   . Stroke Neg Hx     family hx  . Colon cancer Neg Hx     Social History Social History  Substance Use Topics  . Smoking status: Current Every Day Smoker    Packs/day: 1.00    Years: 8.00    Types: Cigarettes  . Smokeless tobacco: Never Used  . Alcohol use 1.8 oz/week    3 Cans of beer per week    Current Outpatient Prescriptions  Medication Sig Dispense Refill  . aspirin EC 81 MG tablet Take 1 tablet (81 mg total) by mouth daily.    . Aspirin-Salicylamide-Caffeine (BC HEADACHE POWDER PO) Take 1 Package by mouth daily as needed (headache).    Marland Kitchen atorvastatin (LIPITOR) 80 MG tablet Take 1 tablet (80 mg total) by mouth daily. 30 tablet 5  . clonazePAM (KLONOPIN) 1 MG tablet TAKE 1 TABLET BY MOUTH TWICE A DAY 60 tablet 5  . nitroGLYCERIN (NITROSTAT) 0.4 MG SL tablet Place 1 tablet (0.4 mg total) under the tongue every 5 (five) minutes as needed for chest  pain. 30 tablet prn  . pantoprazole (PROTONIX) 40 MG tablet TAKE 1 TABLET BY MOUTH EVERY DAY 90 tablet 2   No current facility-administered medications for this visit.     No Known Allergies  Review of Systems  Constitutional: Negative for activity change and unexpected weight change.  HENT: Negative for dental problem, trouble swallowing and voice change.   Eyes: Negative for visual disturbance.  Respiratory: Negative for cough, shortness of breath and wheezing.   Cardiovascular: Positive for chest pain (with heavy exertion) and palpitations.  Gastrointestinal: Negative for abdominal pain and  blood in stool.  Endocrine: Negative for polydipsia and polyphagia.  Genitourinary: Negative for difficulty urinating and dysuria.  Musculoskeletal: Positive for arthralgias. Negative for myalgias.  Neurological: Negative for seizures, syncope and weakness.  Hematological: Negative for adenopathy. Does not bruise/bleed easily.  Psychiatric/Behavioral: The patient is nervous/anxious.   All other systems reviewed and are negative.   BP (!) 143/98   Pulse 88   Resp 16   Ht 6' (1.829 m)   Wt 191 lb (86.6 kg)   SpO2 98% Comment: ON RA  BMI 25.90 kg/m  Physical Exam  Constitutional: He is oriented to person, place, and time. He appears well-developed and well-nourished. No distress.  HENT:  Head: Normocephalic and atraumatic.  Mouth/Throat: No oropharyngeal exudate.  Eyes: Conjunctivae and EOM are normal. Pupils are equal, round, and reactive to light. No scleral icterus.  Neck: Neck supple. No thyromegaly present.  Cardiovascular: Normal rate, regular rhythm, normal heart sounds and intact distal pulses.  Exam reveals no gallop and no friction rub.   No murmur heard. Normal Allen's test on left  Pulmonary/Chest: Effort normal and breath sounds normal. No respiratory distress. He has no wheezes. He has no rales.  Abdominal: Soft. He exhibits no distension. There is no tenderness.  Musculoskeletal: Normal range of motion. He exhibits no edema or deformity.  Lymphadenopathy:    He has no cervical adenopathy.  Neurological: He is alert and oriented to person, place, and time. No cranial nerve deficit.  Motor intact  Skin: Skin is warm and dry.  Vitals reviewed.    Diagnostic Tests: Conclusion     RPDA lesion, 90 %stenosed.  Ost 1st Diag to 1st Diag lesion, 80 %stenosed.  2nd Diag lesion, 90 %stenosed.  The left ventricular ejection fraction is 55-65% by visual estimate.  The left ventricular systolic function is normal.  LV end diastolic pressure is normal.  Prox RCA  to Mid RCA lesion, 70 %stenosed.  Post Atrio lesion, 70 %stenosed.  3rd RPLB lesion, 90 %stenosed.  Ost LAD to Prox LAD lesion, 50 %stenosed.  1st Mrg lesion, 80 %stenosed.  LM lesion, 40 %stenosed.  Mid LAD to Dist LAD lesion, 90 %stenosed.   1.  Severe multivessel coronary artery disease involving the proximal/mid LAD, Diagonal 1 &2,  OM1, proximal/mid RCA, rPDA, and rPL branch. 2.  Normal left ventricular contraction. 3.  Upper normal left ventricular filling pressure.  Plan: 1.  Expedited outpatient cardiac surgery consultation for CABG tomorrow or early next week. 2.  Start ASA 81 mg daily, atorvastatin 80 mg daily, and prn sublingual nitroglycerin.    I personally reviewed the cardiac catheterization films and concur with Dr. Thompson Caul findings noted above. I also reviewed the films with Mr. and Mrs. [name].  Impression: [name] [name] is a 50 year old gentleman with a strong family history of coronary disease who presents with new onset angina with heavy exertion. He had a positive stress Myoview. At  catheterization he was found to have severe three-vessel coronary disease not amenable to percutaneous intervention. Coronary artery bypass grafting is indicated for survival benefit and relief of symptoms.  I had a long discussion with Mr. and Mrs. [name]. We reviewed the films. I described the proposed operation which is coronary artery bypass grafting with bilateral mammary arteries and left radial artery and possible saphenous vein. They understand the general nature of the operation, the incisions to be used, the use of cardiopulmonary bypass, the expected hospital stay, and the overall recovery. I reviewed the indications, risks, benefits, and alternatives. They understand the risk include, but are not limited to death, MI, DVT, PE, bleeding, possible need for transfusion, infection, cardiac arrhythmias, pleural effusions, as well as the possibility of other organ system dysfunction  including respiratory or renal failure or gastrointestinal complications.  He understands that this operation is palliative and doesn't cure the underlying disease. He will need lifelong medical therapy in addition to surgery. He is likely to need additional interventions at some point in the future.  He understands all the issues noted above. He is not ready to commit 100% to proceeding with surgery and would like to think about it over the weekend. He will call our office on Monday. I will be happy to discuss it further with him if he has any further questions.  Plan: Coronary artery bypass grafting with her mammary arteries and left radial artery on Wednesday, 08/17/2015  Melrose Nakayama, MD [facility] [phone]

## 2015-08-15 NOTE — Telephone Encounter (Signed)
I called and spoke with him.   

## 2015-08-16 ENCOUNTER — Encounter (HOSPITAL_COMMUNITY)
Admission: RE | Admit: 2015-08-16 | Discharge: 2015-08-16 | Disposition: A | Discharge status: Home / Self Care | Payer: BLUE CROSS/BLUE SHIELD | Source: Ambulatory Visit | Attending: Thoracic Surgery (Cardiothoracic Vascular Surgery) | Admitting: Thoracic Surgery (Cardiothoracic Vascular Surgery)

## 2015-08-16 ENCOUNTER — Ambulatory Visit (HOSPITAL_BASED_OUTPATIENT_CLINIC_OR_DEPARTMENT_OTHER)
Admission: RE | Admit: 2015-08-16 | Discharge: 2015-08-16 | Disposition: A | Discharge status: Home / Self Care | Payer: BLUE CROSS/BLUE SHIELD | Source: Ambulatory Visit | Attending: Thoracic Surgery (Cardiothoracic Vascular Surgery) | Admitting: Thoracic Surgery (Cardiothoracic Vascular Surgery)

## 2015-08-16 ENCOUNTER — Encounter (HOSPITAL_COMMUNITY): Payer: Self-pay | Admitting: Critical Care Medicine

## 2015-08-16 ENCOUNTER — Ambulatory Visit (HOSPITAL_COMMUNITY)
Admission: RE | Admit: 2015-08-16 | Discharge: 2015-08-16 | Disposition: A | Discharge status: Home / Self Care | Payer: BLUE CROSS/BLUE SHIELD | Source: Ambulatory Visit | Attending: Thoracic Surgery (Cardiothoracic Vascular Surgery) | Admitting: Thoracic Surgery (Cardiothoracic Vascular Surgery)

## 2015-08-16 ENCOUNTER — Encounter (HOSPITAL_COMMUNITY): Payer: Self-pay

## 2015-08-16 DIAGNOSIS — I2510 Atherosclerotic heart disease of native coronary artery without angina pectoris: Secondary | ICD-10-CM

## 2015-08-16 DIAGNOSIS — R0602 Shortness of breath: Secondary | ICD-10-CM | POA: Diagnosis not present

## 2015-08-16 DIAGNOSIS — I319 Disease of pericardium, unspecified: Secondary | ICD-10-CM | POA: Diagnosis not present

## 2015-08-16 DIAGNOSIS — E7800 Pure hypercholesterolemia, unspecified: Secondary | ICD-10-CM | POA: Diagnosis not present

## 2015-08-16 DIAGNOSIS — Z0183 Encounter for blood typing: Secondary | ICD-10-CM | POA: Insufficient documentation

## 2015-08-16 DIAGNOSIS — Z01818 Encounter for other preprocedural examination: Secondary | ICD-10-CM | POA: Insufficient documentation

## 2015-08-16 DIAGNOSIS — I080 Rheumatic disorders of both mitral and aortic valves: Secondary | ICD-10-CM | POA: Diagnosis not present

## 2015-08-16 DIAGNOSIS — F419 Anxiety disorder, unspecified: Secondary | ICD-10-CM | POA: Diagnosis not present

## 2015-08-16 DIAGNOSIS — D62 Acute posthemorrhagic anemia: Secondary | ICD-10-CM | POA: Diagnosis not present

## 2015-08-16 DIAGNOSIS — Z8249 Family history of ischemic heart disease and other diseases of the circulatory system: Secondary | ICD-10-CM | POA: Diagnosis not present

## 2015-08-16 DIAGNOSIS — E8770 Fluid overload, unspecified: Secondary | ICD-10-CM | POA: Diagnosis not present

## 2015-08-16 DIAGNOSIS — Z951 Presence of aortocoronary bypass graft: Secondary | ICD-10-CM | POA: Diagnosis not present

## 2015-08-16 DIAGNOSIS — Z01812 Encounter for preprocedural laboratory examination: Secondary | ICD-10-CM

## 2015-08-16 DIAGNOSIS — J9811 Atelectasis: Secondary | ICD-10-CM | POA: Diagnosis not present

## 2015-08-16 DIAGNOSIS — K219 Gastro-esophageal reflux disease without esophagitis: Secondary | ICD-10-CM | POA: Diagnosis not present

## 2015-08-16 DIAGNOSIS — I454 Nonspecific intraventricular block: Secondary | ICD-10-CM | POA: Diagnosis not present

## 2015-08-16 DIAGNOSIS — I25118 Atherosclerotic heart disease of native coronary artery with other forms of angina pectoris: Secondary | ICD-10-CM | POA: Diagnosis not present

## 2015-08-16 DIAGNOSIS — F17210 Nicotine dependence, cigarettes, uncomplicated: Secondary | ICD-10-CM | POA: Diagnosis not present

## 2015-08-16 DIAGNOSIS — Z7982 Long term (current) use of aspirin: Secondary | ICD-10-CM | POA: Diagnosis not present

## 2015-08-16 DIAGNOSIS — I25110 Atherosclerotic heart disease of native coronary artery with unstable angina pectoris: Secondary | ICD-10-CM | POA: Diagnosis not present

## 2015-08-16 HISTORY — DX: Atherosclerotic heart disease of native coronary artery without angina pectoris: I25.10

## 2015-08-16 LAB — BLOOD GAS, ARTERIAL
Acid-Base Excess: 0.8 mmol/L (ref 0.0–2.0)
Bicarbonate: 24.8 mEq/L — ABNORMAL HIGH (ref 20.0–24.0)
Drawn by: 206361
FIO2: 21
O2 Saturation: 91.3 %
Patient temperature: 98.6
TCO2: 26 mmol/L (ref 0–100)
pCO2 arterial: 39.1 mmHg (ref 35.0–45.0)
pH, Arterial: 7.418 (ref 7.350–7.450)
pO2, Arterial: 61.8 mmHg — ABNORMAL LOW (ref 80.0–100.0)

## 2015-08-16 LAB — PULMONARY FUNCTION TEST
DL/VA % pred: 92 %
DL/VA: 4.28 ml/min/mmHg/L
DLCO unc % pred: 84 %
DLCO unc: 27.27 ml/min/mmHg
FEF 25-75 Post: 3.7 L/sec
FEF 25-75 Pre: 3.25 L/sec
FEF2575-%Change-Post: 13 %
FEF2575-%Pred-Post: 108 %
FEF2575-%Pred-Pre: 95 %
FEV1-%Change-Post: 2 %
FEV1-%Pred-Post: 95 %
FEV1-%Pred-Pre: 92 %
FEV1-Post: 3.71 L
FEV1-Pre: 3.6 L
FEV1FVC-%Change-Post: 0 %
FEV1FVC-%Pred-Pre: 99 %
FEV6-%Change-Post: 3 %
FEV6-%Pred-Post: 98 %
FEV6-%Pred-Pre: 96 %
FEV6-Post: 4.79 L
FEV6-Pre: 4.65 L
FEV6FVC-%Change-Post: 0 %
FEV6FVC-%Pred-Post: 103 %
FEV6FVC-%Pred-Pre: 103 %
FVC-%Change-Post: 2 %
FVC-%Pred-Post: 95 %
FVC-%Pred-Pre: 93 %
FVC-Post: 4.82 L
FVC-Pre: 4.69 L
Post FEV1/FVC ratio: 77 %
Post FEV6/FVC ratio: 99 %
Pre FEV1/FVC ratio: 77 %
Pre FEV6/FVC Ratio: 99 %
RV % pred: 105 %
RV: 2.17 L
TLC % pred: 104 %
TLC: 7.27 L

## 2015-08-16 LAB — VAS US DOPPLER PRE CABG
LEFT ECA DIAS: -8 cm/s
LEFT VERTEBRAL DIAS: 9 cm/s
Left CCA dist dias: -15 cm/s
Left CCA dist sys: -56 cm/s
Left CCA prox dias: 16 cm/s
Left CCA prox sys: 92 cm/s
Left ICA dist dias: -28 cm/s
Left ICA dist sys: -61 cm/s
Left ICA prox dias: -16 cm/s
Left ICA prox sys: -42 cm/s
RIGHT ECA DIAS: -7 cm/s
RIGHT VERTEBRAL DIAS: 15 cm/s
Right CCA prox dias: 11 cm/s
Right CCA prox sys: 88 cm/s
Right cca dist sys: -59 cm/s

## 2015-08-16 LAB — COMPREHENSIVE METABOLIC PANEL
ALT: 24 U/L (ref 17–63)
AST: 29 U/L (ref 15–41)
Albumin: 4.1 g/dL (ref 3.5–5.0)
Alkaline Phosphatase: 55 U/L (ref 38–126)
Anion gap: 9 (ref 5–15)
BUN: 10 mg/dL (ref 6–20)
CO2: 24 mmol/L (ref 22–32)
Calcium: 9.8 mg/dL (ref 8.9–10.3)
Chloride: 107 mmol/L (ref 101–111)
Creatinine, Ser: 1.12 mg/dL (ref 0.61–1.24)
GFR calc Af Amer: >60 mL/min (ref >60)
GFR calc non Af Amer: >60 mL/min (ref >60)
Glucose, Bld: 126 mg/dL — ABNORMAL HIGH (ref 65–99)
Potassium: 3.6 mmol/L (ref 3.5–5.1)
Sodium: 140 mmol/L (ref 135–145)
Total Bilirubin: 0.2 mg/dL — ABNORMAL LOW (ref 0.3–1.2)
Total Protein: 6.8 g/dL (ref 6.5–8.1)

## 2015-08-16 LAB — URINALYSIS, ROUTINE W REFLEX MICROSCOPIC
Bilirubin Urine: NEGATIVE
Glucose, UA: NEGATIVE mg/dL
Hgb urine dipstick: NEGATIVE
Ketones, ur: NEGATIVE mg/dL
Leukocytes, UA: NEGATIVE
Nitrite: NEGATIVE
Protein, ur: NEGATIVE mg/dL
Specific Gravity, Urine: 1.016 (ref 1.005–1.030)
pH: 6.5 (ref 5.0–8.0)

## 2015-08-16 LAB — CBC
HCT: 45.3 % (ref 39.0–52.0)
Hemoglobin: 15.1 g/dL (ref 13.0–17.0)
MCH: 30.3 pg (ref 26.0–34.0)
MCHC: 33.3 g/dL (ref 30.0–36.0)
MCV: 90.8 fL (ref 78.0–100.0)
Platelets: 225 10*3/uL (ref 150–400)
RBC: 4.99 MIL/uL (ref 4.22–5.81)
RDW: 12.5 % (ref 11.5–15.5)
WBC: 8.2 10*3/uL (ref 4.0–10.5)

## 2015-08-16 LAB — APTT: aPTT: 36 seconds (ref 24–36)

## 2015-08-16 LAB — SURGICAL PCR SCREEN
MRSA, PCR: NEGATIVE
Staphylococcus aureus: NEGATIVE

## 2015-08-16 LAB — TYPE AND SCREEN
ABO/RH(D): O POS
Antibody Screen: NEGATIVE

## 2015-08-16 LAB — PROTIME-INR
INR: 1.02
Prothrombin Time: 13.5 seconds (ref 11.4–15.2)

## 2015-08-16 LAB — ABO/RH: ABO/RH(D): O POS

## 2015-08-16 MED ORDER — SODIUM CHLORIDE 0.9 % IV SOLN
INTRAVENOUS | Status: AC
  Administered 2015-08-17: 69.8 mL/h via INTRAVENOUS
  Filled 2015-08-16: qty 40

## 2015-08-16 MED ORDER — PHENYLEPHRINE HCL 10 MG/ML IJ SOLN
30.0000 ug/min | INTRAVENOUS | Status: AC
  Administered 2015-08-17: 25 ug/min via INTRAVENOUS
  Filled 2015-08-16: qty 2

## 2015-08-16 MED ORDER — DEXTROSE 5 % IV SOLN
1.5000 g | INTRAVENOUS | Status: AC
  Administered 2015-08-17: 1.5 g via INTRAVENOUS
  Administered 2015-08-17: .75 g via INTRAVENOUS
  Filled 2015-08-16 (×2): qty 1.5

## 2015-08-16 MED ORDER — CHLORHEXIDINE GLUCONATE 0.12 % MT SOLN
15.0000 mL | Freq: Once | OROMUCOSAL | Status: AC
  Administered 2015-08-17: 15 mL via OROMUCOSAL
  Filled 2015-08-16: qty 15

## 2015-08-16 MED ORDER — METOPROLOL TARTRATE 12.5 MG HALF TABLET
12.5000 mg | ORAL_TABLET | Freq: Once | ORAL | Status: AC
  Administered 2015-08-17: 12.5 mg via ORAL
  Filled 2015-08-16: qty 1

## 2015-08-16 MED ORDER — SODIUM CHLORIDE 0.9 % IV SOLN
INTRAVENOUS | Status: AC
  Administered 2015-08-17: 1 [IU]/h via INTRAVENOUS
  Filled 2015-08-16: qty 2.5

## 2015-08-16 MED ORDER — MAGNESIUM SULFATE 50 % IJ SOLN
40.0000 meq | INTRAMUSCULAR | Status: DC
  Filled 2015-08-16: qty 10

## 2015-08-16 MED ORDER — PLASMA-LYTE 148 IV SOLN
INTRAVENOUS | Status: AC
  Administered 2015-08-17: 500 mL
  Filled 2015-08-16: qty 2.5

## 2015-08-16 MED ORDER — DEXTROSE 5 % IV SOLN
750.0000 mg | INTRAVENOUS | Status: DC
  Filled 2015-08-16: qty 750

## 2015-08-16 MED ORDER — NITROGLYCERIN IN D5W 200-5 MCG/ML-% IV SOLN
2.0000 ug/min | INTRAVENOUS | Status: AC
Start: 2015-08-17 — End: 2015-08-17
  Administered 2015-08-17: 5 ug/min via INTRAVENOUS
  Filled 2015-08-16: qty 250

## 2015-08-16 MED ORDER — POTASSIUM CHLORIDE 2 MEQ/ML IV SOLN
80.0000 meq | INTRAVENOUS | Status: DC
  Filled 2015-08-16: qty 40

## 2015-08-16 MED ORDER — ALBUTEROL SULFATE (2.5 MG/3ML) 0.083% IN NEBU
2.5000 mg | INHALATION_SOLUTION | Freq: Once | RESPIRATORY_TRACT | Status: AC
  Administered 2015-08-16: 2.5 mg via RESPIRATORY_TRACT

## 2015-08-16 MED ORDER — EPINEPHRINE HCL 1 MG/ML IJ SOLN
0.0000 ug/min | INTRAMUSCULAR | Status: DC
  Filled 2015-08-16: qty 4

## 2015-08-16 MED ORDER — DOPAMINE-DEXTROSE 3.2-5 MG/ML-% IV SOLN
0.0000 ug/kg/min | INTRAVENOUS | Status: DC
  Filled 2015-08-16: qty 250

## 2015-08-16 MED ORDER — SODIUM CHLORIDE 0.9 % IV SOLN
INTRAVENOUS | Status: DC
  Filled 2015-08-16: qty 30

## 2015-08-16 MED ORDER — VANCOMYCIN HCL 10 G IV SOLR
1500.0000 mg | INTRAVENOUS | Status: AC
  Administered 2015-08-17: 1500 mg via INTRAVENOUS
  Filled 2015-08-16 (×2): qty 1500

## 2015-08-16 MED ORDER — DEXMEDETOMIDINE HCL IN NACL 400 MCG/100ML IV SOLN
0.1000 ug/kg/h | INTRAVENOUS | Status: AC
  Administered 2015-08-17: .4 ug/kg/h via INTRAVENOUS
  Filled 2015-08-16: qty 100

## 2015-08-16 NOTE — Pre-Procedure Instructions (Signed)
KAIGE EVINS  08/16/2015      [id number] [place] [address] - [address] [address] Phone: [phone] Fax: [phone]  [id number] - [place], [address] [place] [address] Phone: [phone] Fax: [phone]    Your procedure is scheduled on Aug 16  Report to [facility] at 630 A.M.  Call this number if you have problems the morning of surgery:  [phone]   Remember:  Do not eat food or drink liquids after midnight.  Take these medicines the morning of surgery with A SIP OF WATER Clonazepam (Klonopin), Nitrostat if needed, Pantoprazole (Prontonix)  Stop taking Aspirin, BC's, Goody's, Herbal medications, Fish Oil, Ibuprofen, Advil, Motrin, Aleve, Vitamins   Do not wear jewelry, make-up or nail polish.  Do not wear lotions, powders, or perfumes.  You may wear deoderant.  Do not shave 48 hours prior to surgery.  Men may shave face and neck.  Do not bring valuables to the hospital.  [facility] is not responsible for any belongings or valuables.  Contacts, dentures or bridgework may not be worn into surgery.  Leave your suitcase in the car.  After surgery it may be brought to your room.  For patients admitted to the hospital, discharge time will be determined by your treatment team.  Patients discharged the day of surgery will not be allowed to drive home.    Special instructions:  [facility] - Preparing for Surgery  Before surgery, you can play an important role.  Because skin is not sterile, your skin needs to be as free of germs as possible.  You can reduce the number of germs on you skin by washing with CHG (chlorahexidine gluconate) soap before surgery.  CHG is an antiseptic cleaner which kills germs and bonds with the skin to continue killing germs even after washing.  Please DO NOT use if you have an allergy to CHG or antibacterial soaps.  If your  skin becomes reddened/irritated stop using the CHG and inform your nurse when you arrive at Short Stay.  Do not shave (including legs and underarms) for at least 48 hours prior to the first CHG shower.  You may shave your face.  Please follow these instructions carefully:   1.  Shower with CHG Soap the night before surgery and the     morning of Surgery.  2.  If you choose to wash your hair, wash your hair first as usual with your   normal shampoo.  3.  After you shampoo, rinse your hair and body thoroughly to remove the Shampoo.  4.  Use CHG as you would any other liquid soap.  You can apply chg directly  to the skin and wash gently with scrungie or a clean washcloth.  5.  Apply the CHG Soap to your body ONLY FROM THE NECK DOWN.     Do not use on open wounds or open sores.  Avoid contact with your eyes, ears, mouth and genitals (private parts).  Wash genitals (private parts)   with your normal soap.  6.  Wash thoroughly, paying special attention to the area where your surgery    will be performed.  7.  Thoroughly rinse your body with warm water from the neck down.  8.  DO NOT shower/wash with your normal soap after using and rinsing off the CHG Soap.  9.  Pat yourself dry with a clean  towel.            10.  Wear clean pajamas.            11.  Place clean sheets on your bed the night of your first shower and do not   sleep with pets.  Day of Surgery  Do not apply any lotions/deoderants the morning of surgery.  Please wear clean clothes to the hospital/surgery center.    Please read over the following fact sheets that you were given. Pain Booklet, Coughing and Deep Breathing, Blood Transfusion Information, MRSA Information and Surgical Site Infection Prevention

## 2015-08-16 NOTE — Progress Notes (Signed)
PCP is Dr. Carolann Littler Cardiologist is Dr. Jenkins Rouge Card cath noted in epic from 08-2015 Stress test noted in epic from 07-2015

## 2015-08-16 NOTE — Progress Notes (Signed)
Pre-op Cardiac Surgery  Carotid Findings:  There is no obvious evidence of hemodynamically significant internal carotid artery stenosis bilaterally. Vertebral arteries are patent with antegrade flow.  Upper Extremity Right Left  Brachial Pressures 127-Triphasic 124-Triphasic  Radial Waveforms Triphasic Triphasic  Ulnar Waveforms Triphasic Triphasic  Palmar Arch (Allen's Test) Signal obliterates with radial compression, decreases 50% with ulnar compression. Signal decreases <50% with radial compression, obliterates with ulnar compression.    Lower  Extremity Right Left  Dorsalis Pedis Triphasic Triphasic  Posterior Tibial Triphasic Triphasic   08/16/2015 3:56 PM [name] [name], BS, RVT, RDCS, RDMS

## 2015-08-17 ENCOUNTER — Inpatient Hospital Stay (HOSPITAL_COMMUNITY): Payer: BLUE CROSS/BLUE SHIELD

## 2015-08-17 ENCOUNTER — Inpatient Hospital Stay (HOSPITAL_COMMUNITY): Payer: BLUE CROSS/BLUE SHIELD | Admitting: Certified Registered"

## 2015-08-17 ENCOUNTER — Encounter (HOSPITAL_COMMUNITY): Payer: Self-pay | Admitting: Surgery

## 2015-08-17 ENCOUNTER — Inpatient Hospital Stay (HOSPITAL_COMMUNITY)
Admission: RE | Admit: 2015-08-17 | Discharge: 2015-08-21 | DRG: 236 | Disposition: A | Discharge status: Home / Self Care | Payer: BLUE CROSS/BLUE SHIELD | Source: Ambulatory Visit | Attending: Thoracic Surgery (Cardiothoracic Vascular Surgery) | Admitting: Thoracic Surgery (Cardiothoracic Vascular Surgery)

## 2015-08-17 ENCOUNTER — Inpatient Hospital Stay (HOSPITAL_COMMUNITY)
Admission: RE | Disposition: A | Discharge status: Home / Self Care | Payer: Self-pay | Source: Ambulatory Visit | Attending: Thoracic Surgery (Cardiothoracic Vascular Surgery)

## 2015-08-17 DIAGNOSIS — Z7982 Long term (current) use of aspirin: Secondary | ICD-10-CM | POA: Diagnosis not present

## 2015-08-17 DIAGNOSIS — Z8249 Family history of ischemic heart disease and other diseases of the circulatory system: Secondary | ICD-10-CM

## 2015-08-17 DIAGNOSIS — I319 Disease of pericardium, unspecified: Secondary | ICD-10-CM | POA: Diagnosis not present

## 2015-08-17 DIAGNOSIS — J9811 Atelectasis: Secondary | ICD-10-CM | POA: Diagnosis not present

## 2015-08-17 DIAGNOSIS — E8770 Fluid overload, unspecified: Secondary | ICD-10-CM | POA: Diagnosis not present

## 2015-08-17 DIAGNOSIS — E7800 Pure hypercholesterolemia, unspecified: Secondary | ICD-10-CM | POA: Diagnosis present

## 2015-08-17 DIAGNOSIS — I25110 Atherosclerotic heart disease of native coronary artery with unstable angina pectoris: Secondary | ICD-10-CM | POA: Diagnosis not present

## 2015-08-17 DIAGNOSIS — R0602 Shortness of breath: Secondary | ICD-10-CM | POA: Diagnosis not present

## 2015-08-17 DIAGNOSIS — I2510 Atherosclerotic heart disease of native coronary artery without angina pectoris: Secondary | ICD-10-CM | POA: Diagnosis not present

## 2015-08-17 DIAGNOSIS — F17210 Nicotine dependence, cigarettes, uncomplicated: Secondary | ICD-10-CM | POA: Diagnosis present

## 2015-08-17 DIAGNOSIS — K219 Gastro-esophageal reflux disease without esophagitis: Secondary | ICD-10-CM | POA: Diagnosis present

## 2015-08-17 DIAGNOSIS — I454 Nonspecific intraventricular block: Secondary | ICD-10-CM | POA: Diagnosis present

## 2015-08-17 DIAGNOSIS — I25118 Atherosclerotic heart disease of native coronary artery with other forms of angina pectoris: Secondary | ICD-10-CM | POA: Diagnosis not present

## 2015-08-17 DIAGNOSIS — D62 Acute posthemorrhagic anemia: Secondary | ICD-10-CM | POA: Diagnosis not present

## 2015-08-17 DIAGNOSIS — Z951 Presence of aortocoronary bypass graft: Secondary | ICD-10-CM | POA: Diagnosis not present

## 2015-08-17 DIAGNOSIS — F419 Anxiety disorder, unspecified: Secondary | ICD-10-CM | POA: Diagnosis present

## 2015-08-17 DIAGNOSIS — I080 Rheumatic disorders of both mitral and aortic valves: Secondary | ICD-10-CM | POA: Diagnosis not present

## 2015-08-17 HISTORY — PX: RADIAL ARTERY HARVEST: SHX5067

## 2015-08-17 HISTORY — PX: CORONARY ARTERY BYPASS GRAFT: SHX141

## 2015-08-17 HISTORY — PX: TEE WITHOUT CARDIOVERSION: SHX5443

## 2015-08-17 LAB — MAGNESIUM: Magnesium: 2.7 mg/dL — ABNORMAL HIGH (ref 1.7–2.4)

## 2015-08-17 LAB — GLUCOSE, CAPILLARY
Glucose-Capillary: 119 mg/dL — ABNORMAL HIGH (ref 65–99)
Glucose-Capillary: 120 mg/dL — ABNORMAL HIGH (ref 65–99)
Glucose-Capillary: 133 mg/dL — ABNORMAL HIGH (ref 65–99)
Glucose-Capillary: 139 mg/dL — ABNORMAL HIGH (ref 65–99)
Glucose-Capillary: 139 mg/dL — ABNORMAL HIGH (ref 65–99)
Glucose-Capillary: 142 mg/dL — ABNORMAL HIGH (ref 65–99)
Glucose-Capillary: 143 mg/dL — ABNORMAL HIGH (ref 65–99)
Glucose-Capillary: 149 mg/dL — ABNORMAL HIGH (ref 65–99)
Glucose-Capillary: 155 mg/dL — ABNORMAL HIGH (ref 65–99)

## 2015-08-17 LAB — POCT I-STAT 3, ART BLOOD GAS (G3+)
Acid-base deficit: 3 mmol/L — ABNORMAL HIGH (ref 0.0–2.0)
Acid-base deficit: 3 mmol/L — ABNORMAL HIGH (ref 0.0–2.0)
Bicarbonate: 26.3 mEq/L — ABNORMAL HIGH (ref 20.0–24.0)
Bicarbonate: 25.1 mEq/L — ABNORMAL HIGH (ref 20.0–24.0)
Bicarbonate: 24.1 mEq/L — ABNORMAL HIGH (ref 20.0–24.0)
Bicarbonate: 23.6 mEq/L (ref 20.0–24.0)
O2 Saturation: 100 %
O2 Saturation: 97 %
O2 Saturation: 98 %
O2 Saturation: 96 %
Patient temperature: 35.8
Patient temperature: 97.7
Patient temperature: 36.2
TCO2: 28 mmol/L (ref 0–100)
TCO2: 26 mmol/L (ref 0–100)
TCO2: 26 mmol/L (ref 0–100)
TCO2: 25 mmol/L (ref 0–100)
pCO2 arterial: 49.4 mmHg — ABNORMAL HIGH (ref 35.0–45.0)
pCO2 arterial: 39.7 mmHg (ref 35.0–45.0)
pCO2 arterial: 47.9 mmHg — ABNORMAL HIGH (ref 35.0–45.0)
pCO2 arterial: 44.9 mmHg (ref 35.0–45.0)
pH, Arterial: 7.334 — ABNORMAL LOW (ref 7.350–7.450)
pH, Arterial: 7.404 (ref 7.350–7.450)
pH, Arterial: 7.307 — ABNORMAL LOW (ref 7.350–7.450)
pH, Arterial: 7.324 — ABNORMAL LOW (ref 7.350–7.450)
pO2, Arterial: 317 mmHg — ABNORMAL HIGH (ref 80.0–100.0)
pO2, Arterial: 82 mmHg (ref 80.0–100.0)
pO2, Arterial: 116 mmHg — ABNORMAL HIGH (ref 80.0–100.0)
pO2, Arterial: 88 mmHg (ref 80.0–100.0)

## 2015-08-17 LAB — POCT I-STAT, CHEM 8
BUN: 14 mg/dL (ref 6–20)
BUN: 12 mg/dL (ref 6–20)
BUN: 10 mg/dL (ref 6–20)
BUN: 11 mg/dL (ref 6–20)
BUN: 12 mg/dL (ref 6–20)
BUN: 10 mg/dL (ref 6–20)
BUN: 11 mg/dL (ref 6–20)
BUN: 12 mg/dL (ref 6–20)
Calcium, Ion: 1.3 mmol/L (ref 1.13–1.30)
Calcium, Ion: 1.27 mmol/L (ref 1.13–1.30)
Calcium, Ion: 1.08 mmol/L — ABNORMAL LOW (ref 1.13–1.30)
Calcium, Ion: 1.08 mmol/L — ABNORMAL LOW (ref 1.13–1.30)
Calcium, Ion: 1.28 mmol/L (ref 1.13–1.30)
Calcium, Ion: 1.09 mmol/L — ABNORMAL LOW (ref 1.13–1.30)
Calcium, Ion: 1.12 mmol/L — ABNORMAL LOW (ref 1.13–1.30)
Calcium, Ion: 1.17 mmol/L (ref 1.13–1.30)
Chloride: 104 mmol/L (ref 101–111)
Chloride: 105 mmol/L (ref 101–111)
Chloride: 101 mmol/L (ref 101–111)
Chloride: 106 mmol/L (ref 101–111)
Chloride: 98 mmol/L — ABNORMAL LOW (ref 101–111)
Chloride: 100 mmol/L — ABNORMAL LOW (ref 101–111)
Chloride: 102 mmol/L (ref 101–111)
Chloride: 107 mmol/L (ref 101–111)
Creatinine, Ser: 0.8 mg/dL (ref 0.61–1.24)
Creatinine, Ser: 0.8 mg/dL (ref 0.61–1.24)
Creatinine, Ser: 0.7 mg/dL (ref 0.61–1.24)
Creatinine, Ser: 0.7 mg/dL (ref 0.61–1.24)
Creatinine, Ser: 0.7 mg/dL (ref 0.61–1.24)
Creatinine, Ser: 0.7 mg/dL (ref 0.61–1.24)
Creatinine, Ser: 0.8 mg/dL (ref 0.61–1.24)
Creatinine, Ser: 0.8 mg/dL (ref 0.61–1.24)
Glucose, Bld: 110 mg/dL — ABNORMAL HIGH (ref 65–99)
Glucose, Bld: 107 mg/dL — ABNORMAL HIGH (ref 65–99)
Glucose, Bld: 104 mg/dL — ABNORMAL HIGH (ref 65–99)
Glucose, Bld: 112 mg/dL — ABNORMAL HIGH (ref 65–99)
Glucose, Bld: 96 mg/dL (ref 65–99)
Glucose, Bld: 133 mg/dL — ABNORMAL HIGH (ref 65–99)
Glucose, Bld: 138 mg/dL — ABNORMAL HIGH (ref 65–99)
Glucose, Bld: 160 mg/dL — ABNORMAL HIGH (ref 65–99)
HCT: 41 % (ref 39.0–52.0)
HCT: 38 % — ABNORMAL LOW (ref 39.0–52.0)
HCT: 29 % — ABNORMAL LOW (ref 39.0–52.0)
HCT: 30 % — ABNORMAL LOW (ref 39.0–52.0)
HCT: 37 % — ABNORMAL LOW (ref 39.0–52.0)
HCT: 30 % — ABNORMAL LOW (ref 39.0–52.0)
HCT: 28 % — ABNORMAL LOW (ref 39.0–52.0)
HCT: 31 % — ABNORMAL LOW (ref 39.0–52.0)
Hemoglobin: 13.9 g/dL (ref 13.0–17.0)
Hemoglobin: 12.9 g/dL — ABNORMAL LOW (ref 13.0–17.0)
Hemoglobin: 10.2 g/dL — ABNORMAL LOW (ref 13.0–17.0)
Hemoglobin: 12.6 g/dL — ABNORMAL LOW (ref 13.0–17.0)
Hemoglobin: 9.9 g/dL — ABNORMAL LOW (ref 13.0–17.0)
Hemoglobin: 10.2 g/dL — ABNORMAL LOW (ref 13.0–17.0)
Hemoglobin: 9.5 g/dL — ABNORMAL LOW (ref 13.0–17.0)
Hemoglobin: 10.5 g/dL — ABNORMAL LOW (ref 13.0–17.0)
Potassium: 4 mmol/L (ref 3.5–5.1)
Potassium: 3.9 mmol/L (ref 3.5–5.1)
Potassium: 4 mmol/L (ref 3.5–5.1)
Potassium: 4.2 mmol/L (ref 3.5–5.1)
Potassium: 4.2 mmol/L (ref 3.5–5.1)
Potassium: 4.6 mmol/L (ref 3.5–5.1)
Potassium: 3.6 mmol/L (ref 3.5–5.1)
Potassium: 3.9 mmol/L (ref 3.5–5.1)
Sodium: 142 mmol/L (ref 135–145)
Sodium: 141 mmol/L (ref 135–145)
Sodium: 137 mmol/L (ref 135–145)
Sodium: 137 mmol/L (ref 135–145)
Sodium: 140 mmol/L (ref 135–145)
Sodium: 139 mmol/L (ref 135–145)
Sodium: 142 mmol/L (ref 135–145)
Sodium: 143 mmol/L (ref 135–145)
TCO2: 28 mmol/L (ref 0–100)
TCO2: 29 mmol/L (ref 0–100)
TCO2: 26 mmol/L (ref 0–100)
TCO2: 28 mmol/L (ref 0–100)
TCO2: 30 mmol/L (ref 0–100)
TCO2: 25 mmol/L (ref 0–100)
TCO2: 25 mmol/L (ref 0–100)
TCO2: 23 mmol/L (ref 0–100)

## 2015-08-17 LAB — CBC
HCT: 35.9 % — ABNORMAL LOW (ref 39.0–52.0)
HCT: 36.3 % — ABNORMAL LOW (ref 39.0–52.0)
Hemoglobin: 12 g/dL — ABNORMAL LOW (ref 13.0–17.0)
Hemoglobin: 12.2 g/dL — ABNORMAL LOW (ref 13.0–17.0)
MCH: 29.9 pg (ref 26.0–34.0)
MCH: 30 pg (ref 26.0–34.0)
MCHC: 33.4 g/dL (ref 30.0–36.0)
MCHC: 33.6 g/dL (ref 30.0–36.0)
MCV: 89.5 fL (ref 78.0–100.0)
MCV: 89.2 fL (ref 78.0–100.0)
Platelets: 144 10*3/uL — ABNORMAL LOW (ref 150–400)
Platelets: 198 10*3/uL (ref 150–400)
RBC: 4.01 MIL/uL — ABNORMAL LOW (ref 4.22–5.81)
RBC: 4.07 MIL/uL — ABNORMAL LOW (ref 4.22–5.81)
RDW: 12.5 % (ref 11.5–15.5)
RDW: 12.6 % (ref 11.5–15.5)
WBC: 13.3 10*3/uL — ABNORMAL HIGH (ref 4.0–10.5)
WBC: 16.7 10*3/uL — ABNORMAL HIGH (ref 4.0–10.5)

## 2015-08-17 LAB — POCT I-STAT 4, (NA,K, GLUC, HGB,HCT)
Glucose, Bld: 116 mg/dL — ABNORMAL HIGH (ref 65–99)
HCT: 33 % — ABNORMAL LOW (ref 39.0–52.0)
Hemoglobin: 11.2 g/dL — ABNORMAL LOW (ref 13.0–17.0)
Potassium: 3.2 mmol/L — ABNORMAL LOW (ref 3.5–5.1)
Sodium: 143 mmol/L (ref 135–145)

## 2015-08-17 LAB — HEMOGLOBIN A1C
Hgb A1c MFr Bld: 5.2 % (ref 4.8–5.6)
Mean Plasma Glucose: 103 mg/dL

## 2015-08-17 LAB — PLATELET COUNT: Platelets: 167 10*3/uL (ref 150–400)

## 2015-08-17 LAB — CREATININE, SERUM
Creatinine, Ser: 0.96 mg/dL (ref 0.61–1.24)
GFR calc Af Amer: >60 mL/min (ref >60)
GFR calc non Af Amer: >60 mL/min (ref >60)

## 2015-08-17 LAB — APTT: aPTT: 34 seconds (ref 24–36)

## 2015-08-17 LAB — HEMOGLOBIN AND HEMATOCRIT, BLOOD
HCT: 32.3 % — ABNORMAL LOW (ref 39.0–52.0)
Hemoglobin: 11 g/dL — ABNORMAL LOW (ref 13.0–17.0)

## 2015-08-17 LAB — PROTIME-INR
INR: 1.38
Prothrombin Time: 17 seconds — ABNORMAL HIGH (ref 11.4–15.2)

## 2015-08-17 SURGERY — CORONARY ARTERY BYPASS GRAFTING (CABG)
Anesthesia: General | Site: Chest

## 2015-08-17 MED ORDER — DEXMEDETOMIDINE HCL IN NACL 200 MCG/50ML IV SOLN
0.0000 ug/kg/h | INTRAVENOUS | Status: DC
  Filled 2015-08-17: qty 50

## 2015-08-17 MED ORDER — METOPROLOL TARTRATE 25 MG/10 ML ORAL SUSPENSION: 12.5000 mg | Freq: Two times a day (BID) | ORAL | Status: DC

## 2015-08-17 MED ORDER — BISACODYL 10 MG RE SUPP: 10.0000 mg | Freq: Every day | RECTAL | Status: DC

## 2015-08-17 MED ORDER — ALBUMIN HUMAN 5 % IV SOLN
INTRAVENOUS | Status: DC | PRN
  Administered 2015-08-17 (×2): via INTRAVENOUS

## 2015-08-17 MED ORDER — ACETAMINOPHEN 160 MG/5ML PO SOLN: 1000.0000 mg | Freq: Four times a day (QID) | ORAL | Status: DC

## 2015-08-17 MED ORDER — FENTANYL CITRATE (PF) 100 MCG/2ML IJ SOLN
INTRAMUSCULAR | Status: AC
  Filled 2015-08-17: qty 2

## 2015-08-17 MED ORDER — LACTATED RINGERS IV SOLN: INTRAVENOUS | Status: DC

## 2015-08-17 MED ORDER — ASPIRIN EC 325 MG PO TBEC
325.0000 mg | DELAYED_RELEASE_TABLET | Freq: Every day | ORAL | Status: DC
  Administered 2015-08-18 – 2015-08-20 (×3): 325 mg via ORAL
  Filled 2015-08-17 (×3): qty 1

## 2015-08-17 MED ORDER — LACTATED RINGERS IV SOLN
INTRAVENOUS | Status: DC | PRN
  Administered 2015-08-17 (×2): via INTRAVENOUS

## 2015-08-17 MED ORDER — SODIUM CHLORIDE 0.45 % IV SOLN
INTRAVENOUS | Status: DC | PRN
  Administered 2015-08-17: 15:00:00 via INTRAVENOUS

## 2015-08-17 MED ORDER — POTASSIUM CHLORIDE 10 MEQ/50ML IV SOLN
10.0000 meq | INTRAVENOUS | Status: AC
  Administered 2015-08-17 (×3): 10 meq via INTRAVENOUS

## 2015-08-17 MED ORDER — PROTAMINE SULFATE 10 MG/ML IV SOLN
INTRAVENOUS | Status: DC | PRN
  Administered 2015-08-17: 220 mg via INTRAVENOUS

## 2015-08-17 MED ORDER — CHLORHEXIDINE GLUCONATE 4 % EX LIQD: 30.0000 mL | CUTANEOUS | Status: DC

## 2015-08-17 MED ORDER — ACETAMINOPHEN 650 MG RE SUPP
650.0000 mg | Freq: Once | RECTAL | Status: AC
  Administered 2015-08-17: 650 mg via RECTAL

## 2015-08-17 MED ORDER — SODIUM CHLORIDE 0.9% FLUSH
3.0000 mL | Freq: Two times a day (BID) | INTRAVENOUS | Status: DC
  Administered 2015-08-18 – 2015-08-19 (×3): 3 mL via INTRAVENOUS

## 2015-08-17 MED ORDER — HEPARIN SODIUM (PORCINE) 1000 UNIT/ML IJ SOLN
INTRAMUSCULAR | Status: AC
  Filled 2015-08-17: qty 1

## 2015-08-17 MED ORDER — ROCURONIUM BROMIDE 10 MG/ML (PF) SYRINGE
PREFILLED_SYRINGE | INTRAVENOUS | Status: AC
  Filled 2015-08-17: qty 20

## 2015-08-17 MED ORDER — ACETAMINOPHEN 160 MG/5ML PO SOLN: 650.0000 mg | Freq: Once | ORAL | Status: AC

## 2015-08-17 MED ORDER — PROPOFOL 10 MG/ML IV BOLUS
INTRAVENOUS | Status: DC | PRN
  Administered 2015-08-17: 110 mg via INTRAVENOUS

## 2015-08-17 MED ORDER — BISACODYL 5 MG PO TBEC
10.0000 mg | DELAYED_RELEASE_TABLET | Freq: Every day | ORAL | Status: DC
  Administered 2015-08-18 – 2015-08-20 (×3): 10 mg via ORAL
  Filled 2015-08-17 (×3): qty 2

## 2015-08-17 MED ORDER — VANCOMYCIN HCL IN DEXTROSE 1-5 GM/200ML-% IV SOLN
1000.0000 mg | Freq: Once | INTRAVENOUS | Status: AC
  Administered 2015-08-17: 1000 mg via INTRAVENOUS
  Filled 2015-08-17: qty 200

## 2015-08-17 MED ORDER — CHLORHEXIDINE GLUCONATE 0.12 % MT SOLN
15.0000 mL | OROMUCOSAL | Status: AC
  Administered 2015-08-17: 15 mL via OROMUCOSAL
  Filled 2015-08-17: qty 15

## 2015-08-17 MED ORDER — FENTANYL CITRATE (PF) 100 MCG/2ML IJ SOLN
INTRAMUSCULAR | Status: AC
  Filled 2015-08-17: qty 20

## 2015-08-17 MED ORDER — ARTIFICIAL TEARS OP OINT
TOPICAL_OINTMENT | OPHTHALMIC | Status: DC | PRN
  Administered 2015-08-17: 1 via OPHTHALMIC

## 2015-08-17 MED ORDER — DOCUSATE SODIUM 100 MG PO CAPS
200.0000 mg | ORAL_CAPSULE | Freq: Every day | ORAL | Status: DC
  Administered 2015-08-18 – 2015-08-20 (×3): 200 mg via ORAL
  Filled 2015-08-17 (×3): qty 2

## 2015-08-17 MED ORDER — DEXTROSE 5 % IV SOLN
1.5000 g | Freq: Two times a day (BID) | INTRAVENOUS | Status: AC
  Administered 2015-08-17 – 2015-08-19 (×4): 1.5 g via INTRAVENOUS
  Filled 2015-08-17 (×4): qty 1.5

## 2015-08-17 MED ORDER — PANTOPRAZOLE SODIUM 40 MG PO TBEC
40.0000 mg | DELAYED_RELEASE_TABLET | Freq: Every day | ORAL | Status: DC
  Administered 2015-08-19 – 2015-08-20 (×2): 40 mg via ORAL
  Filled 2015-08-17 (×2): qty 1

## 2015-08-17 MED ORDER — PHENYLEPHRINE 40 MCG/ML (10ML) SYRINGE FOR IV PUSH (FOR BLOOD PRESSURE SUPPORT)
PREFILLED_SYRINGE | INTRAVENOUS | Status: DC | PRN
  Administered 2015-08-17 (×2): 20 ug via INTRAVENOUS
  Administered 2015-08-17: 80 ug via INTRAVENOUS

## 2015-08-17 MED ORDER — ONDANSETRON HCL 4 MG/2ML IJ SOLN: 4.0000 mg | Freq: Four times a day (QID) | INTRAMUSCULAR | Status: DC | PRN

## 2015-08-17 MED ORDER — PHENYLEPHRINE HCL 10 MG/ML IJ SOLN
0.0000 ug/min | INTRAVENOUS | Status: DC
  Filled 2015-08-17: qty 2

## 2015-08-17 MED ORDER — ROCURONIUM BROMIDE 10 MG/ML (PF) SYRINGE
PREFILLED_SYRINGE | INTRAVENOUS | Status: AC
  Filled 2015-08-17: qty 10

## 2015-08-17 MED ORDER — LACTATED RINGERS IV SOLN
INTRAVENOUS | Status: DC | PRN
  Administered 2015-08-17: 08:00:00 via INTRAVENOUS

## 2015-08-17 MED ORDER — FAMOTIDINE IN NACL 20-0.9 MG/50ML-% IV SOLN
20.0000 mg | Freq: Two times a day (BID) | INTRAVENOUS | Status: AC
  Administered 2015-08-17: 20 mg via INTRAVENOUS

## 2015-08-17 MED ORDER — INSULIN REGULAR BOLUS VIA INFUSION
0.0000 [IU] | Freq: Three times a day (TID) | INTRAVENOUS | Status: DC
  Filled 2015-08-17: qty 10

## 2015-08-17 MED ORDER — METOPROLOL TARTRATE 5 MG/5ML IV SOLN: 2.5000 mg | INTRAVENOUS | Status: DC | PRN

## 2015-08-17 MED ORDER — MIDAZOLAM HCL 10 MG/2ML IJ SOLN
INTRAMUSCULAR | Status: AC
  Filled 2015-08-17: qty 2

## 2015-08-17 MED ORDER — ARTIFICIAL TEARS OP OINT
TOPICAL_OINTMENT | OPHTHALMIC | Status: AC
  Filled 2015-08-17: qty 3.5

## 2015-08-17 MED ORDER — ASPIRIN 81 MG PO CHEW: 324.0000 mg | CHEWABLE_TABLET | Freq: Every day | ORAL | Status: DC

## 2015-08-17 MED ORDER — NITROGLYCERIN IN D5W 200-5 MCG/ML-% IV SOLN: 0.0000 ug/min | INTRAVENOUS | Status: AC

## 2015-08-17 MED ORDER — SODIUM CHLORIDE 0.9% FLUSH: 3.0000 mL | INTRAVENOUS | Status: DC | PRN

## 2015-08-17 MED ORDER — ATORVASTATIN CALCIUM 80 MG PO TABS
80.0000 mg | ORAL_TABLET | Freq: Every day | ORAL | Status: DC
  Administered 2015-08-18 – 2015-08-20 (×3): 80 mg via ORAL
  Filled 2015-08-17 (×3): qty 1

## 2015-08-17 MED ORDER — SODIUM CHLORIDE 0.9 % IV SOLN: INTRAVENOUS | Status: DC

## 2015-08-17 MED ORDER — MORPHINE SULFATE (PF) 2 MG/ML IV SOLN
2.0000 mg | INTRAVENOUS | Status: DC | PRN
  Administered 2015-08-17 – 2015-08-18 (×9): 2 mg via INTRAVENOUS
  Filled 2015-08-17 (×5): qty 1
  Filled 2015-08-17 (×2): qty 2

## 2015-08-17 MED ORDER — EPHEDRINE SULFATE 50 MG/ML IJ SOLN
INTRAMUSCULAR | Status: AC
  Filled 2015-08-17: qty 1

## 2015-08-17 MED ORDER — METOPROLOL TARTRATE 12.5 MG HALF TABLET
12.5000 mg | ORAL_TABLET | Freq: Two times a day (BID) | ORAL | Status: DC
  Administered 2015-08-18 – 2015-08-20 (×3): 12.5 mg via ORAL
  Filled 2015-08-17 (×6): qty 1

## 2015-08-17 MED ORDER — MIDAZOLAM HCL 2 MG/2ML IJ SOLN: 2.0000 mg | INTRAMUSCULAR | Status: DC | PRN

## 2015-08-17 MED ORDER — MORPHINE SULFATE (PF) 2 MG/ML IV SOLN: 1.0000 mg | INTRAVENOUS | Status: AC | PRN

## 2015-08-17 MED ORDER — MIDAZOLAM HCL 5 MG/5ML IJ SOLN
INTRAMUSCULAR | Status: DC | PRN
  Administered 2015-08-17: 2 mg via INTRAVENOUS
  Administered 2015-08-17: 3 mg via INTRAVENOUS
  Administered 2015-08-17: 2 mg via INTRAVENOUS
  Administered 2015-08-17: 1 mg via INTRAVENOUS
  Administered 2015-08-17: 2 mg via INTRAVENOUS

## 2015-08-17 MED ORDER — LACTATED RINGERS IV SOLN
INTRAVENOUS | Status: DC
  Administered 2015-08-19: 20 mL/h via INTRAVENOUS

## 2015-08-17 MED ORDER — LACTATED RINGERS IV SOLN: 500.0000 mL | Freq: Once | INTRAVENOUS | Status: DC | PRN

## 2015-08-17 MED ORDER — POTASSIUM CHLORIDE 10 MEQ/50ML IV SOLN
10.0000 meq | Freq: Once | INTRAVENOUS | Status: AC
  Administered 2015-08-17: 10 meq via INTRAVENOUS

## 2015-08-17 MED ORDER — MAGNESIUM SULFATE 4 GM/100ML IV SOLN
4.0000 g | Freq: Once | INTRAVENOUS | Status: AC
  Administered 2015-08-17: 4 g via INTRAVENOUS
  Filled 2015-08-17: qty 100

## 2015-08-17 MED ORDER — ALBUMIN HUMAN 5 % IV SOLN: 250.0000 mL | INTRAVENOUS | Status: AC | PRN

## 2015-08-17 MED ORDER — SODIUM CHLORIDE 0.9 % IV SOLN: 250.0000 mL | INTRAVENOUS | Status: DC

## 2015-08-17 MED ORDER — ROCURONIUM BROMIDE 10 MG/ML (PF) SYRINGE
PREFILLED_SYRINGE | INTRAVENOUS | Status: DC | PRN
  Administered 2015-08-17 (×2): 50 mg via INTRAVENOUS
  Administered 2015-08-17: 100 mg via INTRAVENOUS
  Administered 2015-08-17 (×2): 50 mg via INTRAVENOUS

## 2015-08-17 MED ORDER — OXYCODONE HCL 5 MG PO TABS
5.0000 mg | ORAL_TABLET | ORAL | Status: DC | PRN
  Administered 2015-08-18: 5 mg via ORAL
  Administered 2015-08-18: 10 mg via ORAL
  Administered 2015-08-18: 5 mg via ORAL
  Administered 2015-08-18 (×3): 10 mg via ORAL
  Administered 2015-08-19: 5 mg via ORAL
  Administered 2015-08-20 – 2015-08-21 (×6): 10 mg via ORAL
  Filled 2015-08-17 (×3): qty 2
  Filled 2015-08-17: qty 1
  Filled 2015-08-17 (×7): qty 2
  Filled 2015-08-17: qty 1
  Filled 2015-08-17: qty 2
  Filled 2015-08-17: qty 1

## 2015-08-17 MED ORDER — PROTAMINE SULFATE 10 MG/ML IV SOLN
INTRAVENOUS | Status: AC
  Filled 2015-08-17: qty 25

## 2015-08-17 MED ORDER — FENTANYL CITRATE (PF) 100 MCG/2ML IJ SOLN
INTRAMUSCULAR | Status: DC | PRN
  Administered 2015-08-17 (×3): 100 ug via INTRAVENOUS
  Administered 2015-08-17: 150 ug via INTRAVENOUS
  Administered 2015-08-17: 100 ug via INTRAVENOUS
  Administered 2015-08-17: 250 ug via INTRAVENOUS
  Administered 2015-08-17: 50 ug via INTRAVENOUS
  Administered 2015-08-17: 100 ug via INTRAVENOUS
  Administered 2015-08-17: 50 ug via INTRAVENOUS
  Administered 2015-08-17: 100 ug via INTRAVENOUS
  Administered 2015-08-17: 500 ug via INTRAVENOUS

## 2015-08-17 MED ORDER — TRAMADOL HCL 50 MG PO TABS
50.0000 mg | ORAL_TABLET | ORAL | Status: DC | PRN
  Administered 2015-08-18 – 2015-08-19 (×4): 100 mg via ORAL
  Administered 2015-08-20: 50 mg via ORAL
  Filled 2015-08-17: qty 1
  Filled 2015-08-17: qty 2
  Filled 2015-08-17: qty 1
  Filled 2015-08-17 (×4): qty 2

## 2015-08-17 MED ORDER — PHENYLEPHRINE 40 MCG/ML (10ML) SYRINGE FOR IV PUSH (FOR BLOOD PRESSURE SUPPORT)
PREFILLED_SYRINGE | INTRAVENOUS | Status: AC
  Filled 2015-08-17: qty 10

## 2015-08-17 MED ORDER — HEMOSTATIC AGENTS (NO CHARGE) OPTIME
TOPICAL | Status: DC | PRN
  Administered 2015-08-17: 1 via TOPICAL

## 2015-08-17 MED ORDER — SODIUM CHLORIDE 0.9 % IJ SOLN
INTRAMUSCULAR | Status: DC | PRN
  Administered 2015-08-17: 16 mL via TOPICAL

## 2015-08-17 MED ORDER — ACETAMINOPHEN 500 MG PO TABS
1000.0000 mg | ORAL_TABLET | Freq: Four times a day (QID) | ORAL | Status: DC
  Administered 2015-08-18 – 2015-08-21 (×12): 1000 mg via ORAL
  Filled 2015-08-17 (×13): qty 2

## 2015-08-17 MED ORDER — HEPARIN SODIUM (PORCINE) 1000 UNIT/ML IJ SOLN
INTRAMUSCULAR | Status: DC | PRN
  Administered 2015-08-17: 23000 [IU] via INTRAVENOUS
  Administered 2015-08-17: 2000 [IU] via INTRAVENOUS

## 2015-08-17 MED ORDER — SODIUM CHLORIDE 0.9 % IV SOLN
INTRAVENOUS | Status: DC
  Administered 2015-08-18: 01:00:00 via INTRAVENOUS
  Filled 2015-08-17 (×2): qty 2.5

## 2015-08-17 MED FILL — Magnesium Sulfate Inj 50%: INTRAMUSCULAR | Qty: 10 | Status: AC

## 2015-08-17 SURGICAL SUPPLY — 95 items
APPLIER CLIP 9.375 SM OPEN (CLIP)
BAG DECANTER FOR FLEXI CONT (MISCELLANEOUS) ×4 IMPLANT
BANDAGE ELASTIC 4 VELCRO ST LF (GAUZE/BANDAGES/DRESSINGS) ×8 IMPLANT
BANDAGE ELASTIC 6 VELCRO ST LF (GAUZE/BANDAGES/DRESSINGS) ×4 IMPLANT
BASKET HEART (ORDER IN 25'S) (MISCELLANEOUS) ×1
BASKET HEART (ORDER IN 25S) (MISCELLANEOUS) ×3 IMPLANT
BLADE STERNUM SYSTEM 6 (BLADE) ×4 IMPLANT
BLADE SURG 15 STRL LF DISP TIS (BLADE) ×3 IMPLANT
BLADE SURG 15 STRL SS (BLADE) ×1
BNDG GAUZE ELAST 4 BULKY (GAUZE/BANDAGES/DRESSINGS) ×8 IMPLANT
CANISTER SUCTION 2500CC (MISCELLANEOUS) ×4 IMPLANT
CANNULA EZ GLIDE AORTIC 21FR (CANNULA) ×4 IMPLANT
CATH CPB KIT HENDRICKSON (MISCELLANEOUS) ×4 IMPLANT
CATH ROBINSON RED A/P 18FR (CATHETERS) ×4 IMPLANT
CATH THORACIC 36FR (CATHETERS) ×4 IMPLANT
CATH THORACIC 36FR RT ANG (CATHETERS) ×4 IMPLANT
CLIP APPLIE 9.375 SM OPEN (CLIP) IMPLANT
CLIP TI MEDIUM 24 (CLIP) ×8 IMPLANT
CLIP TI WIDE RED SMALL 24 (CLIP) ×16 IMPLANT
COVER MAYO STAND STRL (DRAPES) IMPLANT
CRADLE DONUT ADULT HEAD (MISCELLANEOUS) ×4 IMPLANT
DERMABOND ADHESIVE PROPEN (GAUZE/BANDAGES/DRESSINGS) ×2
DERMABOND ADVANCED .7 DNX6 (GAUZE/BANDAGES/DRESSINGS) ×6 IMPLANT
DRAPE CARDIOVASCULAR INCISE (DRAPES) ×1
DRAPE EXTREMITY T 121X128X90 (DRAPE) IMPLANT
DRAPE PROXIMA HALF (DRAPES) IMPLANT
DRAPE SLUSH/WARMER DISC (DRAPES) ×4 IMPLANT
DRAPE SRG 135X102X78XABS (DRAPES) ×3 IMPLANT
DRSG COVADERM 4X14 (GAUZE/BANDAGES/DRESSINGS) ×4 IMPLANT
ELECT REM PT RETURN 9FT ADLT (ELECTROSURGICAL) ×8
ELECTRODE REM PT RTRN 9FT ADLT (ELECTROSURGICAL) ×6 IMPLANT
FELT TEFLON 1X6 (MISCELLANEOUS) ×8 IMPLANT
GAUZE SPONGE 4X4 12PLY STRL (GAUZE/BANDAGES/DRESSINGS) ×8 IMPLANT
GEL ULTRASOUND 20GR AQUASONIC (MISCELLANEOUS) IMPLANT
GLOVE SURG SIGNA 7.5 PF LTX (GLOVE) ×12 IMPLANT
GOWN STRL REUS W/ TWL LRG LVL3 (GOWN DISPOSABLE) ×12 IMPLANT
GOWN STRL REUS W/ TWL XL LVL3 (GOWN DISPOSABLE) ×6 IMPLANT
GOWN STRL REUS W/TWL LRG LVL3 (GOWN DISPOSABLE) ×4
GOWN STRL REUS W/TWL XL LVL3 (GOWN DISPOSABLE) ×2
HARMONIC SHEARS 14CM COAG (MISCELLANEOUS) ×8 IMPLANT
HEMOSTAT POWDER SURGIFOAM 1G (HEMOSTASIS) ×12 IMPLANT
HEMOSTAT SURGICEL 2X14 (HEMOSTASIS) ×4 IMPLANT
INSERT FOGARTY XLG (MISCELLANEOUS) IMPLANT
KIT BASIN OR (CUSTOM PROCEDURE TRAY) ×4 IMPLANT
KIT ROOM TURNOVER OR (KITS) ×4 IMPLANT
KIT SUCTION CATH 14FR (SUCTIONS) ×8 IMPLANT
KIT VASOVIEW 6 PRO VH 2400 (KITS) ×4 IMPLANT
MARKER GRAFT CORONARY BYPASS (MISCELLANEOUS) ×12 IMPLANT
NS IRRIG 1000ML POUR BTL (IV SOLUTION) ×20 IMPLANT
PACK OPEN HEART (CUSTOM PROCEDURE TRAY) ×4 IMPLANT
PAD ARMBOARD 7.5X6 YLW CONV (MISCELLANEOUS) ×8 IMPLANT
PAD ELECT DEFIB RADIOL ZOLL (MISCELLANEOUS) ×4 IMPLANT
PENCIL BUTTON HOLSTER BLD 10FT (ELECTRODE) ×4 IMPLANT
PUNCH AORTIC ROT 4.0MM RCL 40 (MISCELLANEOUS) ×4 IMPLANT
PUNCH AORTIC ROTATE 4.0MM (MISCELLANEOUS) IMPLANT
PUNCH AORTIC ROTATE 4.5MM 8IN (MISCELLANEOUS) IMPLANT
PUNCH AORTIC ROTATE 5MM 8IN (MISCELLANEOUS) IMPLANT
SET CARDIOPLEGIA MPS 5001102 (MISCELLANEOUS) ×4 IMPLANT
SOLUTION ANTI FOG 6CC (MISCELLANEOUS) ×4 IMPLANT
SPONGE GAUZE 4X4 12PLY STER LF (GAUZE/BANDAGES/DRESSINGS) ×12 IMPLANT
SPONGE LAP 18X18 X RAY DECT (DISPOSABLE) ×12 IMPLANT
SUT BONE WAX W31G (SUTURE) ×4 IMPLANT
SUT MNCRL AB 4-0 PS2 18 (SUTURE) ×8 IMPLANT
SUT PROLENE 3 0 SH DA (SUTURE) ×4 IMPLANT
SUT PROLENE 4 0 RB 1 (SUTURE)
SUT PROLENE 4 0 SH DA (SUTURE) IMPLANT
SUT PROLENE 4-0 RB1 .5 CRCL 36 (SUTURE) IMPLANT
SUT PROLENE 6 0 C 1 30 (SUTURE) ×8 IMPLANT
SUT PROLENE 7 0 BV 1 (SUTURE) ×4 IMPLANT
SUT PROLENE 7 0 BV1 MDA (SUTURE) ×4 IMPLANT
SUT PROLENE 8 0 BV175 6 (SUTURE) IMPLANT
SUT STEEL 6MS V (SUTURE) ×4 IMPLANT
SUT STEEL STERNAL CCS#1 18IN (SUTURE) IMPLANT
SUT STEEL SZ 6 DBL 3X14 BALL (SUTURE) ×4 IMPLANT
SUT VIC AB 1 CTX 36 (SUTURE) ×2
SUT VIC AB 1 CTX36XBRD ANBCTR (SUTURE) ×6 IMPLANT
SUT VIC AB 2-0 CT1 27 (SUTURE) ×2
SUT VIC AB 2-0 CT1 TAPERPNT 27 (SUTURE) ×6 IMPLANT
SUT VIC AB 2-0 CTX 27 (SUTURE) IMPLANT
SUT VIC AB 3-0 SH 27 (SUTURE)
SUT VIC AB 3-0 SH 27X BRD (SUTURE) IMPLANT
SUT VIC AB 3-0 X1 27 (SUTURE) IMPLANT
SUT VICRYL 4-0 PS2 18IN ABS (SUTURE) IMPLANT
SUTURE E-PAK OPEN HEART (SUTURE) ×4 IMPLANT
SYR 50ML SLIP (SYRINGE) IMPLANT
SYSTEM SAHARA CHEST DRAIN ATS (WOUND CARE) ×4 IMPLANT
TAPE CLOTH SURG 4X10 WHT LF (GAUZE/BANDAGES/DRESSINGS) ×4 IMPLANT
TAPE PAPER 2X10 WHT MICROPORE (GAUZE/BANDAGES/DRESSINGS) ×4 IMPLANT
TOWEL OR 17X24 6PK STRL BLUE (TOWEL DISPOSABLE) ×8 IMPLANT
TOWEL OR 17X26 10 PK STRL BLUE (TOWEL DISPOSABLE) ×8 IMPLANT
TRAY FOLEY IC TEMP SENS 16FR (CATHETERS) ×4 IMPLANT
TUBE FEEDING 8FR 16IN STR KANG (MISCELLANEOUS) ×4 IMPLANT
TUBING INSUFFLATION (TUBING) ×4 IMPLANT
UNDERPAD 30X30 INCONTINENT (UNDERPADS AND DIAPERS) ×4 IMPLANT
WATER STERILE IRR 1000ML POUR (IV SOLUTION) ×8 IMPLANT

## 2015-08-17 NOTE — Anesthesia Procedure Notes (Signed)
Anesthesia Procedure Note Procedures: Right IJ Gordy Councilman Catheter Insertion: [id number]: The patient was identified and consent obtained.  TO was performed, and full barrier precautions were used.  The skin was anesthetized with lidocaine-4cc plain with 25g needle.  Once the vein was located with the 22 ga. needle using ultrasound guidance , the wire was inserted into the vein.  The wire location was confirmed with ultrasound.  The tissue was dilated and the 8.5 [place] cordis catheter was carefully inserted. Afterwards Gordy Councilman catheter was inserted. PA catheter at 48cm.  The patient tolerated the procedure well.   CE

## 2015-08-17 NOTE — Procedures (Signed)
Extubation Procedure Note  Patient Details:   Name: [name] [name] DOB: 1965-03-15 MRN: [id number]   Airway Documentation:  Airway 8 mm (Active)  Secured at (cm) 24 cm 08/17/2015 12:00 AM    Evaluation  O2 sats: stable throughout Complications: No apparent complications Patient did tolerate procedure well. Bilateral Breath Sounds: Clear   Yes   Pt extubated per SICU wean protocol.  NIF -35, VC 940ml.  Pt awake and following commands and extubated to 4L [place].  RT will continue to monitor.  Pierre Bali 08/17/2015, 6:55 PM

## 2015-08-17 NOTE — Progress Notes (Signed)
  Echocardiogram Echocardiogram Transesophageal has been performed.  Jennette Dubin 08/17/2015, 10:00 AM

## 2015-08-17 NOTE — Brief Op Note (Addendum)
08/17/2015  1:16 PM  PATIENT:  [name] [name]  50 y.o. male  PRE-OPERATIVE DIAGNOSIS:  Coronary artery disease  POST-OPERATIVE DIAGNOSIS:  Coronary artery disease  PROCEDURE:  Coronary artery bypass grafting x 5,  Endoscopic vein harvest of the greater saphenous left vein,  Open harvest of left radial artery  LIMA to LAD SVG to Diagonal 1 sequentially SVG to OM1- PL2 Left radial artery to PDA   SURGEON:  Surgeon(s) and Role:    * Melrose Nakayama, MD - Primary  PHYSICIAN ASSISTANT:  Nonnie Done, PA-C Jadene Pierini, PA-C Nicholes Rough, [place]   ANESTHESIA:   general  EBL:  Total I/O In: 1000 [I.V.:1000] Out: 1625 [Urine:1625]  BLOOD ADMINISTERED:  DRAINS: routine   LOCAL MEDICATIONS USED:  NONE  SPECIMEN:  No Specimen  DISPOSITION OF SPECIMEN:  N/A  COUNTS:  YES  PLAN OF CARE: Admit to inpatient   PATIENT DISPOSITION:  ICU - intubated and hemodynamically stable.   Delay start of Pharmacological VTE agent (>24hrs) due to surgical blood loss or risk of bleeding: yes  XC= 95 min CPB= 131 min

## 2015-08-17 NOTE — H&P (View-Only) (Signed)
PCP is [name], MD Referring Provider is Belva Crome, MD/ Cardiologist Jenkins Rouge, MD  Chief Complaint  Patient presents with  . Coronary Artery Disease    CATH 08/11/15, STRESS 07/29/15    HPI: [name] [name] is a 50 year old man sent for consultation for three-vessel coronary disease.  [name] [name] is a 50 year old man with a strong family history of coronary disease, borderline high cholesterol, and a history of tobacco abuse. He has no prior history of coronary disease. 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 This resolved with rest. He says that he has noted this before. On July 4 he was swimming and he felt a sensation of left-sided chest pressure and his heart racing. Because of his strong family history he mention this to Dr. Elease [name]. He did a stress Myoview to further evaluate due to his family history. It showed a defect of moderate severity in the basal inferoseptal, basal inferior, mid inferior and apical inferior walls. It was interpreted as an intermediate risk study.  He saw Dr. Johnsie [name]. He recommended cardiac catheterization. That was done yesterday by Dr. Tamala Julian. It showed severe diffuse three-vessel coronary disease with preserved left ventricular function.  He has not had any rest or nocturnal symptoms. In fact he has only had symptoms with very heavy exertion. He is in disbelief that his coronary disease is of the severity that was found. He does not have orthopnea, paroxysmal dyspnea, or peripheral edema. He has not had any syncope or presyncope. He denies claudication.   Past Medical History:  Diagnosis Date  . Acute bronchitis 03/31/2009  . ANXIETY 11/10/2008  . Arthritis   . EPIGASTRIC PAIN 12/08/2009  . GERD (gastroesophageal reflux disease)   . 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 Other and unspecified hyperlipidemia 12/29/2008    Past Surgical History:  Procedure Laterality Date   . CARDIAC CATHETERIZATION N/A 08/11/2015   Procedure: Left Heart Cath and Coronary Angiography;  Surgeon: Belva Crome, MD;  Location: [facility];  Service: Cardiovascular;  Laterality: N/A;  . WISDOM TOOTH EXTRACTION  1989    Family History  Problem Relation Age of Onset  . Cancer Mother     brain ca  . Heart disease Father 26    CABG  . Heart disease Paternal Aunt   . Heart disease Paternal Uncle   . Diabetes Brother   . Stroke Neg Hx     family hx  . Colon cancer Neg Hx     Social History Social History  Substance Use Topics  . Smoking status: Current Every Day Smoker    Packs/day: 1.00    Years: 8.00    Types: Cigarettes  . Smokeless tobacco: Never Used  . Alcohol use 1.8 oz/week    3 Cans of beer per week    Current Outpatient Prescriptions  Medication Sig Dispense Refill  . aspirin EC 81 MG tablet Take 1 tablet (81 mg total) by mouth daily.    . Aspirin-Salicylamide-Caffeine (BC HEADACHE POWDER PO) Take 1 Package by mouth daily as needed (headache).    Marland Kitchen atorvastatin (LIPITOR) 80 MG tablet Take 1 tablet (80 mg total) by mouth daily. 30 tablet 5  . clonazePAM (KLONOPIN) 1 MG tablet TAKE 1 TABLET BY MOUTH TWICE A DAY 60 tablet 5  . nitroGLYCERIN (NITROSTAT) 0.4 MG SL tablet Place 1 tablet (0.4 mg total) under the tongue every 5 (five) minutes as needed for chest  pain. 30 tablet prn  . pantoprazole (PROTONIX) 40 MG tablet TAKE 1 TABLET BY MOUTH EVERY DAY 90 tablet 2   No current facility-administered medications for this visit.     No Known Allergies  Review of Systems  Constitutional: Negative for activity change and unexpected weight change.  HENT: Negative for dental problem, trouble swallowing and voice change.   Eyes: Negative for visual disturbance.  Respiratory: Negative for cough, shortness of breath and wheezing.   Cardiovascular: Positive for chest pain (with heavy exertion) and palpitations.  Gastrointestinal: Negative for abdominal pain and  blood in stool.  Endocrine: Negative for polydipsia and polyphagia.  Genitourinary: Negative for difficulty urinating and dysuria.  Musculoskeletal: Positive for arthralgias. Negative for myalgias.  Neurological: Negative for seizures, syncope and weakness.  Hematological: Negative for adenopathy. Does not bruise/bleed easily.  Psychiatric/Behavioral: The patient is nervous/anxious.   All other systems reviewed and are negative.   BP (!) 143/98   Pulse 88   Resp 16   Ht 6' (1.829 m)   Wt 191 lb (86.6 kg)   SpO2 98% Comment: ON RA  BMI 25.90 kg/m  Physical Exam  Constitutional: He is oriented to person, place, and time. He appears well-developed and well-nourished. No distress.  HENT:  Head: Normocephalic and atraumatic.  Mouth/Throat: No oropharyngeal exudate.  Eyes: Conjunctivae and EOM are normal. Pupils are equal, round, and reactive to light. No scleral icterus.  Neck: Neck supple. No thyromegaly present.  Cardiovascular: Normal rate, regular rhythm, normal heart sounds and intact distal pulses.  Exam reveals no gallop and no friction rub.   No murmur heard. Normal Allen's test on left  Pulmonary/Chest: Effort normal and breath sounds normal. No respiratory distress. He has no wheezes. He has no rales.  Abdominal: Soft. He exhibits no distension. There is no tenderness.  Musculoskeletal: Normal range of motion. He exhibits no edema or deformity.  Lymphadenopathy:    He has no cervical adenopathy.  Neurological: He is alert and oriented to person, place, and time. No cranial nerve deficit.  Motor intact  Skin: Skin is warm and dry.  Vitals reviewed.    Diagnostic Tests: Conclusion     RPDA lesion, 90 %stenosed.  Ost 1st Diag to 1st Diag lesion, 80 %stenosed.  2nd Diag lesion, 90 %stenosed.  The left ventricular ejection fraction is 55-65% by visual estimate.  The left ventricular systolic function is normal.  LV end diastolic pressure is normal.  Prox RCA  to Mid RCA lesion, 70 %stenosed.  Post Atrio lesion, 70 %stenosed.  3rd RPLB lesion, 90 %stenosed.  Ost LAD to Prox LAD lesion, 50 %stenosed.  1st Mrg lesion, 80 %stenosed.  LM lesion, 40 %stenosed.  Mid LAD to Dist LAD lesion, 90 %stenosed.   1.  Severe multivessel coronary artery disease involving the proximal/mid LAD, Diagonal 1 &2,  OM1, proximal/mid RCA, rPDA, and rPL branch. 2.  Normal left ventricular contraction. 3.  Upper normal left ventricular filling pressure.  Plan: 1.  Expedited outpatient cardiac surgery consultation for CABG tomorrow or early next week. 2.  Start ASA 81 mg daily, atorvastatin 80 mg daily, and prn sublingual nitroglycerin.    I personally reviewed the cardiac catheterization films and concur with Dr. Thompson Caul findings noted above. I also reviewed the films with Mr. and [name] [name].  Impression: [name] [name] is a 50 year old gentleman with a strong family history of coronary disease who presents with new onset angina with heavy exertion. He had a positive stress Myoview. At  catheterization he was found to have severe three-vessel coronary disease not amenable to percutaneous intervention. Coronary artery bypass grafting is indicated for survival benefit and relief of symptoms.  I had a long discussion with [name] [name]. We reviewed the films. I described the proposed operation which is coronary artery bypass grafting with bilateral mammary arteries and left radial artery and possible saphenous vein. They understand the general nature of the operation, the incisions to be used, the use of cardiopulmonary bypass, the expected hospital stay, and the overall recovery. I reviewed the indications, risks, benefits, and alternatives. They understand the risk include, but are not limited to death, MI, DVT, PE, bleeding, possible need for transfusion, infection, cardiac arrhythmias, pleural effusions, as well as the possibility of other organ system dysfunction  including respiratory or renal failure or gastrointestinal complications.  He understands that this operation is palliative and doesn't cure the underlying disease. He will need lifelong medical therapy in addition to surgery. He is likely to need additional interventions at some point in the future.  He understands all the issues noted above. He is not ready to commit 100% to proceeding with surgery and would like to think about it over the weekend. He will call our office on Monday. I will be happy to discuss it further with him if he has any further questions.  Plan: Coronary artery bypass grafting with her mammary arteries and left radial artery on Wednesday, 08/17/2015  Melrose Nakayama, MD [facility] [phone]

## 2015-08-17 NOTE — Interval H&P Note (Signed)
History and Physical Interval Note: Patient seen and examined Has normal Allen's test on left and minimal change of pulse ox signal on left index finger with radial compression. Will proceed with left radial harvest Will make final decision re: RIMA in [place]. Roxan Hockey, MD [facility] [phone]  08/17/2015 8:20 AM  [name] [name]  has presented today for surgery, with the diagnosis of CAD  The various methods of treatment have been discussed with the patient and family. After consideration of risks, benefits and other options for treatment, the patient has consented to  Procedure(s) with comments: CORONARY ARTERY BYPASS GRAFTING (CABG) (N/A) - BILATERAL IMA RADIAL ARTERY HARVEST (Left) TRANSESOPHAGEAL ECHOCARDIOGRAM (TEE) (N/A) as a surgical intervention .  The patient's history has been reviewed, patient examined, no change in status, stable for surgery.  I have reviewed the patient's chart and labs.  Questions were answered to the patient's satisfaction.     Melrose Nakayama

## 2015-08-17 NOTE — Transfer of Care (Signed)
Immediate Anesthesia Transfer of Care Note  Patient: [name] [name]  Procedure(s) Performed: Procedure(s): CORONARY ARTERY BYPASS GRAFTING (CABG) times five, LIMA to LAD, SVG to Diagonal sequentially SVG to OM-PL and Left Radial Artery to PD (N/A) RADIAL ARTERY HARVEST (Left) TRANSESOPHAGEAL ECHOCARDIOGRAM (TEE) (N/A)  Patient Location: SICU  Anesthesia Type:General  Level of Consciousness: Patient remains intubated per anesthesia plan  Airway & Oxygen Therapy: Patient remains intubated per anesthesia plan and Patient placed on Ventilator (see vital sign flow sheet for setting)  Post-op Assessment: Report given to RN and Post -op Vital signs reviewed and stable  Post vital signs: Reviewed and stable  Last Vitals:  Vitals:   08/17/15 0655  BP: 108/77  Pulse: 60  Resp: 18  Temp: 36.6 C    Last Pain:  Vitals:   08/17/15 0655  TempSrc: Oral       1512 VS BP 112/60 HR 80 Sats 100 RR 10 on ventilator   Complications: No apparent anesthesia complications

## 2015-08-17 NOTE — Anesthesia Procedure Notes (Signed)
Procedure Name: Intubation Date/Time: 08/17/2015 8:52 AM Performed by: Merrilyn Puma B Pre-anesthesia Checklist: Patient identified, Suction available, Patient being monitored and Timeout performed Patient Re-evaluated:Patient Re-evaluated prior to inductionOxygen Delivery Method: Circle system utilized Preoxygenation: Pre-oxygenation with 100% oxygen Intubation Type: IV induction Ventilation: Mask ventilation without difficulty and Oral airway inserted - appropriate to patient size Laryngoscope Size: Mac and 4 Grade View: Grade III Tube type: Oral Tube size: 8.0 mm Number of attempts: 1 Airway Equipment and Method: Stylet Placement Confirmation: ETT inserted through vocal cords under direct vision,  positive ETCO2,  breath sounds checked- equal and bilateral and CO2 detector Secured at: 24 cm Tube secured with: Tape

## 2015-08-17 NOTE — Progress Notes (Signed)
Patient ID: [name] [name], male   DOB: 1965/07/07, 50 y.o.   MRN: [id number] EVENING ROUNDS NOTE :     [address]       ,[place] [address]             [phone]                 Day of Surgery Procedure(s) (LRB): CORONARY ARTERY BYPASS GRAFTING (CABG) times five, LIMA to LAD, SVG to Diagonal sequentially SVG to OM-PL and Left Radial Artery to PD (N/A) RADIAL ARTERY HARVEST (Left) TRANSESOPHAGEAL ECHOCARDIOGRAM (TEE) (N/A)  Total Length of Stay:  LOS: 0 days  BP 114/78 (BP Location: Right Arm)   Pulse 65   Temp (!) 96.8 F (36 C) (Core (Comment)) Comment (Src): swan ganz  Resp 12   Ht 6' (1.829 m)   Wt 192 lb (87.1 kg)   SpO2 100%   BMI 26.04 kg/m   .Intake/Output      08/15 0701 - 08/16 0700 08/16 0701 - 08/17 0700   I.V. (mL/kg)  2560.8 (29.4)   Blood  655   NG/GT  30   IV Piggyback  790   Total Intake(mL/kg)  4035.8 (46.3)   Urine (mL/kg/hr)  2545 (2.5)   Blood  1625 (1.6)   Chest Tube  180 (0.2)   Total Output   4350   Net   -314.2          . sodium chloride 20 mL/hr at 08/17/15 1515  . [START ON 08/18/2015] sodium chloride    . sodium chloride    . dexmedetomidine Stopped (08/17/15 1630)  . insulin (NOVOLIN-R) infusion 1.6 Units/hr (08/17/15 1800)  . lactated ringers    . lactated ringers 20 mL/hr at 08/17/15 1515  . nitroGLYCERIN 20 mcg/min (08/17/15 1815)  . phenylephrine (NEO-SYNEPHRINE) Adult infusion Stopped (08/17/15 1515)     Lab Results  Component Value Date   WBC 13.3 (H) 08/17/2015   HGB 12.0 (L) 08/17/2015   HCT 35.9 (L) 08/17/2015   PLT 144 (L) 08/17/2015   GLUCOSE 116 (H) 08/17/2015   CHOL 193 12/10/2014   TRIG 70.0 12/10/2014   HDL 47.30 12/10/2014   LDLDIRECT 157.6 12/21/2008   LDLCALC 132 (H) 12/10/2014   ALT 24 08/16/2015   AST 29 08/16/2015   NA 143 08/17/2015   K 3.2 (L) 08/17/2015   CL 102 08/17/2015   CREATININE 0.80 08/17/2015   BUN 11 08/17/2015   CO2 24 08/16/2015   TSH 1.00 12/10/2014   PSA 0.90  12/10/2014   INR 1.38 08/17/2015   HGBA1C 5.2 08/16/2015   Waking up, vent being weaned  Not bleeding   Grace Isaac MD  [facility] [phone] Office [phone] 08/17/2015 6:34 PM

## 2015-08-17 NOTE — Anesthesia Preprocedure Evaluation (Signed)
Anesthesia Evaluation  Patient identified by MRN, date of birth, ID band Patient awake    Reviewed: Allergy & Precautions, NPO status , Patient's Chart, lab work & pertinent test results  Airway Mallampati: II       Dental   Pulmonary former smoker,    breath sounds clear to auscultation       Cardiovascular + angina + CAD   Rhythm:Regular Rate:Normal     Neuro/Psych    GI/Hepatic Neg liver ROS, GERD  ,  Endo/Other    Renal/GU negative Renal ROS     Musculoskeletal   Abdominal   Peds  Hematology   Anesthesia Other Findings   Reproductive/Obstetrics                             Anesthesia Physical Anesthesia Plan  ASA: III  Anesthesia Plan: General   Post-op Pain Management:    Induction: Intravenous  Airway Management Planned: Oral ETT  Additional Equipment: Arterial line, TEE, Ultrasound Guidance Line Placement and PA Cath  Intra-op Plan:   Post-operative Plan: Post-operative intubation/ventilation  Informed Consent:   Plan Discussed with:   Anesthesia Plan Comments:         Anesthesia Quick Evaluation

## 2015-08-18 ENCOUNTER — Inpatient Hospital Stay (HOSPITAL_COMMUNITY): Payer: BLUE CROSS/BLUE SHIELD

## 2015-08-18 ENCOUNTER — Encounter (HOSPITAL_COMMUNITY): Payer: Self-pay | Admitting: Internal Medicine

## 2015-08-18 LAB — GLUCOSE, CAPILLARY
Glucose-Capillary: 101 mg/dL — ABNORMAL HIGH (ref 65–99)
Glucose-Capillary: 105 mg/dL — ABNORMAL HIGH (ref 65–99)
Glucose-Capillary: 108 mg/dL — ABNORMAL HIGH (ref 65–99)
Glucose-Capillary: 110 mg/dL — ABNORMAL HIGH (ref 65–99)
Glucose-Capillary: 115 mg/dL — ABNORMAL HIGH (ref 65–99)
Glucose-Capillary: 116 mg/dL — ABNORMAL HIGH (ref 65–99)
Glucose-Capillary: 123 mg/dL — ABNORMAL HIGH (ref 65–99)
Glucose-Capillary: 128 mg/dL — ABNORMAL HIGH (ref 65–99)
Glucose-Capillary: 129 mg/dL — ABNORMAL HIGH (ref 65–99)
Glucose-Capillary: 129 mg/dL — ABNORMAL HIGH (ref 65–99)
Glucose-Capillary: 130 mg/dL — ABNORMAL HIGH (ref 65–99)

## 2015-08-18 LAB — CREATININE, SERUM
Creatinine, Ser: 1.2 mg/dL (ref 0.61–1.24)
GFR calc Af Amer: >60 mL/min (ref >60)
GFR calc non Af Amer: >60 mL/min (ref >60)

## 2015-08-18 LAB — BASIC METABOLIC PANEL
Anion gap: 4 — ABNORMAL LOW (ref 5–15)
BUN: 12 mg/dL (ref 6–20)
CO2: 24 mmol/L (ref 22–32)
Calcium: 8.4 mg/dL — ABNORMAL LOW (ref 8.9–10.3)
Chloride: 110 mmol/L (ref 101–111)
Creatinine, Ser: 0.96 mg/dL (ref 0.61–1.24)
GFR calc Af Amer: >60 mL/min (ref >60)
GFR calc non Af Amer: >60 mL/min (ref >60)
Glucose, Bld: 113 mg/dL — ABNORMAL HIGH (ref 65–99)
Potassium: 4 mmol/L (ref 3.5–5.1)
Sodium: 138 mmol/L (ref 135–145)

## 2015-08-18 LAB — POCT I-STAT, CHEM 8
BUN: 18 mg/dL (ref 6–20)
Calcium, Ion: 1.23 mmol/L (ref 1.13–1.30)
Chloride: 100 mmol/L — ABNORMAL LOW (ref 101–111)
Creatinine, Ser: 1 mg/dL (ref 0.61–1.24)
Glucose, Bld: 130 mg/dL — ABNORMAL HIGH (ref 65–99)
HCT: 33 % — ABNORMAL LOW (ref 39.0–52.0)
Hemoglobin: 11.2 g/dL — ABNORMAL LOW (ref 13.0–17.0)
Potassium: 4.1 mmol/L (ref 3.5–5.1)
Sodium: 139 mmol/L (ref 135–145)
TCO2: 25 mmol/L (ref 0–100)

## 2015-08-18 LAB — CBC
HCT: 34.9 % — ABNORMAL LOW (ref 39.0–52.0)
HCT: 33.8 % — ABNORMAL LOW (ref 39.0–52.0)
Hemoglobin: 11.7 g/dL — ABNORMAL LOW (ref 13.0–17.0)
Hemoglobin: 11.2 g/dL — ABNORMAL LOW (ref 13.0–17.0)
MCH: 30.1 pg (ref 26.0–34.0)
MCH: 30.3 pg (ref 26.0–34.0)
MCHC: 33.5 g/dL (ref 30.0–36.0)
MCHC: 33.1 g/dL (ref 30.0–36.0)
MCV: 89.7 fL (ref 78.0–100.0)
MCV: 91.4 fL (ref 78.0–100.0)
Platelets: 180 10*3/uL (ref 150–400)
Platelets: 174 10*3/uL (ref 150–400)
RBC: 3.89 MIL/uL — ABNORMAL LOW (ref 4.22–5.81)
RBC: 3.7 MIL/uL — ABNORMAL LOW (ref 4.22–5.81)
RDW: 12.7 % (ref 11.5–15.5)
RDW: 12.9 % (ref 11.5–15.5)
WBC: 13.6 10*3/uL — ABNORMAL HIGH (ref 4.0–10.5)
WBC: 13.6 10*3/uL — ABNORMAL HIGH (ref 4.0–10.5)

## 2015-08-18 LAB — MAGNESIUM
Magnesium: 2.5 mg/dL — ABNORMAL HIGH (ref 1.7–2.4)
Magnesium: 2.3 mg/dL (ref 1.7–2.4)

## 2015-08-18 MED ORDER — INSULIN ASPART 100 UNIT/ML ~~LOC~~ SOLN
0.0000 [IU] | SUBCUTANEOUS | Status: DC
  Administered 2015-08-18 – 2015-08-19 (×4): 2 [IU] via SUBCUTANEOUS

## 2015-08-18 MED ORDER — ISOSORBIDE MONONITRATE ER 30 MG PO TB24
30.0000 mg | ORAL_TABLET | Freq: Every day | ORAL | Status: DC
Start: 2015-08-18 — End: 2015-08-21
  Administered 2015-08-18 – 2015-08-20 (×3): 30 mg via ORAL
  Filled 2015-08-18 (×3): qty 1

## 2015-08-18 MED ORDER — KETOROLAC TROMETHAMINE 30 MG/ML IJ SOLN
30.0000 mg | Freq: Four times a day (QID) | INTRAMUSCULAR | Status: AC
  Administered 2015-08-18 – 2015-08-19 (×5): 30 mg via INTRAVENOUS
  Filled 2015-08-18 (×6): qty 1

## 2015-08-18 MED ORDER — ENOXAPARIN SODIUM 40 MG/0.4ML ~~LOC~~ SOLN
40.0000 mg | Freq: Every day | SUBCUTANEOUS | Status: DC
Start: 2015-08-18 — End: 2015-08-21
  Administered 2015-08-18 – 2015-08-20 (×3): 40 mg via SUBCUTANEOUS
  Filled 2015-08-18 (×3): qty 0.4

## 2015-08-18 MED FILL — Sodium Chloride IV Soln 0.9%: INTRAVENOUS | Qty: 2000 | Status: AC

## 2015-08-18 MED FILL — Heparin Sodium (Porcine) Inj 1000 Unit/ML: INTRAMUSCULAR | Qty: 10 | Status: AC

## 2015-08-18 MED FILL — Lidocaine HCl IV Inj 20 MG/ML: INTRAVENOUS | Qty: 5 | Status: AC

## 2015-08-18 MED FILL — Electrolyte-R (PH 7.4) Solution: INTRAVENOUS | Qty: 4000 | Status: AC

## 2015-08-18 MED FILL — Sodium Bicarbonate IV Soln 8.4%: INTRAVENOUS | Qty: 50 | Status: AC

## 2015-08-18 MED FILL — Mannitol IV Soln 20%: INTRAVENOUS | Qty: 500 | Status: AC

## 2015-08-18 NOTE — Progress Notes (Signed)
1 Day Post-Op Procedure(s) (LRB): CORONARY ARTERY BYPASS GRAFTING (CABG) times five, LIMA to LAD, SVG to Diagonal sequentially SVG to OM-PL and Left Radial Artery to PD (N/A) RADIAL ARTERY HARVEST (Left) TRANSESOPHAGEAL ECHOCARDIOGRAM (TEE) (N/A) Subjective: C/o incisional pain, denies nausea  Objective: Vital signs in last 24 hours: Temp:  [95.5 F (35.3 C)-99 F (37.2 C)] 99 F (37.2 C) (08/17 0800) Pulse Rate:  [61-86] 78 (08/17 0800) Cardiac Rhythm: Normal sinus rhythm (08/17 0726) Resp:  [0-45] 21 (08/17 0800) BP: (79-115)/(57-86) 88/61 (08/17 0800) SpO2:  [96 %-100 %] 97 % (08/17 0800) Arterial Line BP: (91-137)/(49-77) 126/58 (08/17 0800) FiO2 (%):  [40 %-50 %] 40 % (08/16 1820) Weight:  [200 lb 13.4 oz (91.1 kg)] 200 lb 13.4 oz (91.1 kg) (08/17 0438)  Hemodynamic parameters for last 24 hours: PAP: (13-31)/(8-21) 26/16 CO:  [3.5 L/min-6 L/min] 6 L/min CI:  [1.7 L/min/m2-2.9 L/min/m2] 2.9 L/min/m2  Intake/Output from previous day: 08/16 0701 - 08/17 0700 In: 4889.6 [I.V.:3164.6; Blood:655; NG/GT:30; IV Piggyback:1040] Out: 5005 [Urine:3035; Emesis/NG output:25; Blood:1625; Chest Tube:320] Intake/Output this shift: Total I/O In: 44.9 [I.V.:44.9] Out: 95 [Urine:45; Chest Tube:50]  General appearance: alert, cooperative and no distress Neurologic: intact Heart: regular rate and rhythm and + rub Lungs: diminished breath sounds bibasilar Abdomen: normal findings: soft, non-tender Extremities: neuro intact L hand  Lab Results:  Recent Labs  08/17/15 2130 08/17/15 2133 08/18/15 0259  WBC 16.7*  --  13.6*  HGB 12.2* 10.5* 11.7*  HCT 36.3* 31.0* 34.9*  PLT 198  --  180   BMET:  Recent Labs  08/16/15 1450  08/17/15 2133 08/18/15 0259  NA 140  < > 143 138  K 3.6  < > 3.9 4.0  CL 107  < > 107 110  CO2 24  --   --  24  GLUCOSE 126*  < > 160* 113*  BUN 10  < > 12 12  CREATININE 1.12  < > 0.80 0.96  CALCIUM 9.8  --   --  8.4*  < > = values in this interval  not displayed.  PT/INR:  Recent Labs  08/17/15 1522  LABPROT 17.0*  INR 1.38   ABG    Component Value Date/Time   PHART 7.324 (L) 08/17/2015 1959   HCO3 23.6 08/17/2015 1959   TCO2 23 08/17/2015 2133   ACIDBASEDEF 3.0 (H) 08/17/2015 1959   O2SAT 96.0 08/17/2015 1959   CBG (last 3)   Recent Labs  08/18/15 0513 08/18/15 0619 08/18/15 0714  GLUCAP 123* 129* 115*    Assessment/Plan: S/P Procedure(s) (LRB): CORONARY ARTERY BYPASS GRAFTING (CABG) times five, LIMA to LAD, SVG to Diagonal sequentially SVG to OM-PL and Left Radial Artery to PD (N/A) RADIAL ARTERY HARVEST (Left) TRANSESOPHAGEAL ECHOCARDIOGRAM (TEE) (N/A) -  Doing well POD # 1 CV- hemodynamics stable with excellent CO  + rub c/w pericarditis  early repol/ mild ST elevation on ECG and rub on exam c/w pericarditis  ASA, beta blocker, statin  On NTG drip for radial- start Imdur  RESP- IS for mild bibasilar atelectasis  RENAL- creatinine and lytes OK  ENDO- CBG controlled with low dose insulin drip- convert to Q4 CBG with SSI  Anemia secondary to ABL- mild, follow  DVT prophylaxis- SCD + enoxaparin  DC chest tubes, a line, swan  Ambulate  Pain control- continue narcotics, will give toradol x 48 hours   LOS: 1 day    [name] [name] 08/18/2015

## 2015-08-18 NOTE — Op Note (Signed)
[name], [name]                  ACCOUNT NO.:  [id number]  MEDICAL RECORD NO.:  [id number]  LOCATION:                                 FACILITY:  PHYSICIAN:  Revonda Standard. Roxan Hockey, M.D.DATE OF BIRTH:  [birth year]  DATE OF PROCEDURE:  08/17/2015 DATE OF DISCHARGE:                              OPERATIVE REPORT   PREOPERATIVE DIAGNOSIS:  Severe three-vessel coronary artery disease with positive stress test.  POSTOPERATIVE DIAGNOSIS:  Severe three-vessel coronary artery disease with positive stress test.  PROCEDURE:  Median sternotomy, extracorporeal circulation, coronary artery bypass grafting x5 (left internal mammary artery to LAD, saphenous vein graft to first diagonal, sequential saphenous vein graft to obtuse marginal 1 and posterior lateral 2, left radial artery to posterior descending, endoscopic vein harvest- left leg.  SURGEON:  Revonda Standard. Roxan Hockey, M.D.  ASSISTANTS:  Ellwood Handler, PA   [place], [place], [place]  ANESTHESIA:  General.  FINDINGS:  Transesophageal echocardiography revealed preserved left ventricular wall motion with no significant valvular pathology.  Right vein small- not harvested; Left vein- good quality.  Left radial and mammary artery good quality.  Diagonal small, poor quality target.  The remaining targets were good quality.  CLINICAL NOTE:  [name] [name] is a 50 year old man with multiple cardiac risk factors, and a very strong family history who had recently had episodes of left-sided chest pressure.  He mentioned this to Dr. Elease Hashimoto, who ordered a stress Myoview.  It showed a moderate severity defect, primarily in the basal and inferior walls.  He was referred to Dr. [name], who recommended cardiac catheterization, which showed severe diffuse 3-vessel coronary disease with preserved left ventricular function.  The patient was advised to undergo coronary artery bypass grafting for survival benefit and preservation of myocardial  function.  The indications, risks, benefits, and alternatives were discussed in detail with the patient.  He understood and accepted the risks and agreed to proceed.  OPERATIVE NOTE:  [name] [name] was brought to the preoperative holding area on 08/17/2015.  Anesthesia placed an arterial blood pressure monitoring line and a Swan-Ganz catheter.  The Allen's test was performed with normal results with capillary refill as well as minimal change in the pulse oximetry tracing with radial compression.  The patient was taken to the operating room, anesthetized, and intubated.  Intravenous antibiotics were administered. A Foley catheter was placed.  The transesophageal echocardiography was performed.  Findings as noted above.  The chest, abdomen, legs, and left arm were prepped and draped in usual sterile fashion.  An incision was made in the medial aspect of the right leg at the level of the knee. The greater saphenous vein was identified.  It was followed for a short distance with the endoscopic dissector, but the vein bifurcated and appeared small and it was not harvested from the right leg, and the incision was later closed.  An incision was made just below the knee on the left leg.  The vein was identified and was harvested endoscopically from just below the knee to the upper thigh.  This was a good caliber and good quality vein.  Simultaneously, while the vein was harvested, an incision  was made over the volar aspect of the left wrist and a short segment of the radial artery was dissected out.  There was good pulse distally and a good Doppler signal in the palmar arch with proximal occlusion.  The incision was extended to just below the antecubital fossa and the radial artery was harvested using the Harmonic Scalpel.  Also, while harvesting these conduits, a median sternotomy was performed and the left internal mammary artery was harvested using standard technique.  2,000 units of heparin was  administered during the vessel harvest.  The mammary artery was a good quality vessel with excellent flow when divided distally.  After harvesting the conduits, the remainder of the full heparin dose was given.  A sternal retractor was placed.  The pericardium was opened. The ascending aorta was inspected.  It was of normal caliber with no palpable plaque.  After confirming adequate anticoagulation with ACT measurement, the aorta was cannulated via concentric 2-0 Ethibond pledgeted pursestring sutures.  A dual-stage venous cannula was placed via a pursestring suture in the right atrial appendage.  Cardiopulmonary bypass was initiated.  Flows were maintained per protocol.  The patient was cooled to 32 degrees Celsius.  The coronary arteries were inspected and anastomotic sites were chosen.  The conduits were inspected and cut to length.  A foam pad was placed in the pericardium to insulate the heart and protect the left phrenic nerve.  A temperature probe was placed in the myocardial septum.  A cardioplegia cannula was placed in the ascending aorta.  The aorta was crossclamped.  The left ventricle was emptied via the aortic root vent.  Cardiac arrest then was achieved with a combination of cold antegrade blood cardioplegia and topical iced saline. 750 mL of cardioplegia was administered.  There was a rapid diastolic arrest and septal cooling to 10 degrees Celsius.  A reversed saphenous vein graft was placed sequentially to obtuse marginal 1 and the second posterior lateral branch of the right coronary.  The obtuse marginal did not have a tight enough stenosis to warrant arterial grafting due to risk of shutdown of the radial artery due to competitive flow.  A side-to-side anastomosis was performed to OM1 with a running 7-0 Prolene suture, the OM-1 was a 1.5 mm vessel of good quality target.  The distal end of the vein then was beveled, and it was anastomosed end-to-side to the second  posterior lateral branch of the right coronary.  This was a slightly smaller vessel, but did accept a 1.5 mm probe.  It was of good quality.  The end-to-side anastomosis was performed with a running 7-0 Prolene suture.  It should be noted that all anastomoses were probed proximally and distally at their completion to ensure patency before tying the suture.  Cardioplegia was administered down the vein graft.  There was excellent flow and good hemostasis at both anastomoses.  Next, the first diagonal branch to the LAD was examined.  It was a very small vessel, but the decision was made to proceed with grafting as planned.  An arteriotomy was made.  This was a 1 mm poor quality target vessel, very thin walled.  The vein was beveled.  It was anastomosed end- to-side with a running 7-0 Prolene suture.  A 1-mm probe did pass proximally and distally.  At the completion of the anastomosis, cardioplegia was administered through both veins.  There was good hemostasis at the diagonal anastomosis.  Additional cardioplegia was administered down the aortic root as well. The  distal end of the left radial graft was beveled and anastomosed end- to-side to the posterior descending branch of the right coronary.  This was a 1.5 mm target.  It was heavily diseased proximally, but a probe did pass easily distally.  The radial was of good quality. An end-to- side anastomosis was performed with a running 8-0 Prolene suture.  At completion of anastomosis, cardioplegia was administered down the vein grafts and aortic root.  There was good backbleeding from the radial artery.  The left internal mammary artery was brought through a window in the pericardium.  The distal end was beveled.  It was anastomosed end-to- side to the distal LAD.  The LAD was 1.5 mm vessel at the site of the anastomosis.  It was heavily diseased proximal to the anastomosis.  The mammary artery was an excellent quality vessel.  The LAD  was good quality.  An end-to-side anastomosis was performed with a running 8-0 Prolene suture.  At the completion of the mammary to LAD anastomosis, the bulldog clamp was briefly removed.  Rapid septal rewarming was noted.  The bulldog clamp was replaced.  The mammary pedicle was tacked to the epicardial surface of the heart with 6-0 Prolene sutures.  After administering additional cardioplegia, the vein grafts and radial artery were cut to length.  The cardioplegia cannula was removed from the ascending aorta.  The proximal radial anastomoses was performed directly to the aorta to a 4.0 mm punch aortotomy with a running 7-0 Prolene suture.  The proximal vein graft anastomoses were performed with running 6-0 Prolene sutures.  At the completion of the second proximal vein anastomosis, the patient was placed in Trendelenburg position. Lidocaine was administered.  The bulldog clamp was again removed from the left mammary artery.  Septal rewarming was again noted.  The heart and aortic root were de-aired and the aortic crossclamp was removed. The total crossclamp time was 95 minutes.  The patient required a single defibrillation with 10 joules and then was in sinus rhythm thereafter.  While rewarming was completed, all proximal and distal anastomoses were inspected for hemostasis, and epicardial pacing wires were placed on the right ventricle and right atrium.  When the patient had rewarmed to a core temperature of 37 degrees Celsius, he was weaned from cardiopulmonary bypass on the first attempt without difficulty.  The total bypass time was 131 minutes.  The patient was on no inotropic support and in sinus rhythm at the time of separation from bypass.  Post-bypass transesophageal echocardiography was essentially unchanged from the prebypass study showing good left ventricular wall motion and no significant valvular pathology.  A test dose of protamine was administered and was well  tolerated.  The atrial and aortic cannulae were removed.  The remainder of the protamine was administered without incident.  The chest was irrigated with warm saline.  Hemostasis was achieved.  Left pleural and mediastinal chest tubes were placed through separate subcostal incisions.  The pericardium was closed with interrupted 3-0 silk sutures.  It came together without tension or kinking the underlying grafts.  The sternum was closed with a combination of single and double heavy gauge stainless steel wires.  The pectoralis fascia, subcutaneous tissue, and skin were closed in standard fashion.  All sponge, needle, and instrument counts were correct at the end of the procedure.  There were no intraoperative complications.  The patient was taken from the operating room to the surgical intensive care unit in good condition.     [name] [name]  Chaya Jan, M.D.     SCH/MEDQ  D:  08/17/2015  T:  08/18/2015  Job:  [id number]

## 2015-08-18 NOTE — Progress Notes (Signed)
[facility] BRIEF SICU PROGRESS NOTE  1 Day Post-Op  S/P Procedure(s) (LRB): CORONARY ARTERY BYPASS GRAFTING (CABG) times five, LIMA to LAD, SVG to Diagonal sequentially SVG to OM-PL and Left Radial Artery to PD (N/A) RADIAL ARTERY HARVEST (Left) TRANSESOPHAGEAL ECHOCARDIOGRAM (TEE) (N/A)   Stable day NSR w/ stable BP Breathing comfortably w/ O2 sats 96-98% on 1 L/min UOP adequate Labs okay  Plan: Continue current plan  Rexene Alberts, MD 08/18/2015 6:10 PM

## 2015-08-18 NOTE — Care Management Note (Signed)
Case Management Note  Patient Details  Name: [name] [name] MRN: [id number] Date of Birth: Nov 29, 1965  Subjective/Objective:    S/p 1 Day Post-Op Procedure(s) (LRB): CORONARY ARTERY BYPASS GRAFTING (CABG) times five  Action/Plan:  PTA from home with wife.  CM will continue to follow for discharge plans   Expected Discharge Date:                  Expected Discharge Plan:  Home/Self Care  In-House Referral:     Discharge planning Services  CM Consult  Post Acute Care Choice:    Choice offered to:     DME Arranged:    DME Agency:     HH Arranged:    HH Agency:     Status of Service:  In process, will continue to follow  If discussed at Long Length of Stay Meetings, dates discussed:    Additional Comments:  Maryclare Labrador, RN 08/18/2015, 2:20 PM

## 2015-08-18 NOTE — Anesthesia Postprocedure Evaluation (Signed)
Anesthesia Post Note  Patient: [name] [name]  Procedure(s) Performed: Procedure(s) (LRB): CORONARY ARTERY BYPASS GRAFTING (CABG) times five, LIMA to LAD, SVG to Diagonal sequentially SVG to OM-PL and Left Radial Artery to PD (N/A) RADIAL ARTERY HARVEST (Left) TRANSESOPHAGEAL ECHOCARDIOGRAM (TEE) (N/A)  Patient location during evaluation: SICU Anesthesia Type: General Level of consciousness: patient remains intubated per anesthesia plan Pain management: pain level controlled Vital Signs Assessment: post-procedure vital signs reviewed and stable Respiratory status: patient remains intubated per anesthesia plan Cardiovascular status: stable    Last Vitals:  Vitals:   08/18/15 1935 08/18/15 2000  BP:  91/67  Pulse:  89  Resp:  19  Temp: 37.2 C     Last Pain:  Vitals:   08/18/15 2030  TempSrc:   PainSc: 3                  [name] [name]

## 2015-08-19 ENCOUNTER — Inpatient Hospital Stay (HOSPITAL_COMMUNITY): Payer: BLUE CROSS/BLUE SHIELD

## 2015-08-19 LAB — BASIC METABOLIC PANEL
Anion gap: 4 — ABNORMAL LOW (ref 5–15)
BUN: 17 mg/dL (ref 6–20)
CO2: 27 mmol/L (ref 22–32)
Calcium: 8.5 mg/dL — ABNORMAL LOW (ref 8.9–10.3)
Chloride: 103 mmol/L (ref 101–111)
Creatinine, Ser: 1.08 mg/dL (ref 0.61–1.24)
GFR calc Af Amer: >60 mL/min (ref >60)
GFR calc non Af Amer: >60 mL/min (ref >60)
Glucose, Bld: 130 mg/dL — ABNORMAL HIGH (ref 65–99)
Potassium: 4.1 mmol/L (ref 3.5–5.1)
Sodium: 134 mmol/L — ABNORMAL LOW (ref 135–145)

## 2015-08-19 LAB — GLUCOSE, CAPILLARY
Glucose-Capillary: 138 mg/dL — ABNORMAL HIGH (ref 65–99)
Glucose-Capillary: 122 mg/dL — ABNORMAL HIGH (ref 65–99)
Glucose-Capillary: 116 mg/dL — ABNORMAL HIGH (ref 65–99)
Glucose-Capillary: 131 mg/dL — ABNORMAL HIGH (ref 65–99)
Glucose-Capillary: 132 mg/dL — ABNORMAL HIGH (ref 65–99)

## 2015-08-19 LAB — CBC
HCT: 31.1 % — ABNORMAL LOW (ref 39.0–52.0)
Hemoglobin: 10.1 g/dL — ABNORMAL LOW (ref 13.0–17.0)
MCH: 30 pg (ref 26.0–34.0)
MCHC: 32.5 g/dL (ref 30.0–36.0)
MCV: 92.3 fL (ref 78.0–100.0)
Platelets: 161 10*3/uL (ref 150–400)
RBC: 3.37 MIL/uL — ABNORMAL LOW (ref 4.22–5.81)
RDW: 12.9 % (ref 11.5–15.5)
WBC: 12.2 10*3/uL — ABNORMAL HIGH (ref 4.0–10.5)

## 2015-08-19 MED ORDER — SODIUM CHLORIDE 0.9 % IV SOLN: 250.0000 mL | INTRAVENOUS | Status: DC | PRN

## 2015-08-19 MED ORDER — INSULIN ASPART 100 UNIT/ML ~~LOC~~ SOLN: 0.0000 [IU] | Freq: Three times a day (TID) | SUBCUTANEOUS | Status: DC

## 2015-08-19 MED ORDER — SODIUM CHLORIDE 0.9% FLUSH
3.0000 mL | Freq: Two times a day (BID) | INTRAVENOUS | Status: DC
Start: 2015-08-19 — End: 2015-08-21
  Administered 2015-08-19 – 2015-08-20 (×3): 3 mL via INTRAVENOUS

## 2015-08-19 MED ORDER — SODIUM CHLORIDE 0.9% FLUSH: 3.0000 mL | INTRAVENOUS | Status: DC | PRN

## 2015-08-19 MED ORDER — MAGNESIUM HYDROXIDE 400 MG/5ML PO SUSP: 30.0000 mL | Freq: Every day | ORAL | Status: DC | PRN

## 2015-08-19 MED ORDER — ALUM & MAG HYDROXIDE-SIMETH 200-200-20 MG/5ML PO SUSP: 15.0000 mL | ORAL | Status: DC | PRN

## 2015-08-19 MED ORDER — CLONAZEPAM 1 MG PO TABS
1.0000 mg | ORAL_TABLET | Freq: Two times a day (BID) | ORAL | Status: DC | PRN
  Administered 2015-08-19 – 2015-08-20 (×3): 1 mg via ORAL
  Filled 2015-08-19 (×3): qty 1

## 2015-08-19 MED ORDER — MOVING RIGHT ALONG BOOK
Freq: Once | Status: DC
  Filled 2015-08-19 (×2): qty 1

## 2015-08-19 NOTE — Progress Notes (Signed)
Patient was transfered to 2w01 after report given. No distress noted. Will transfer care at this time.

## 2015-08-19 NOTE — Progress Notes (Signed)
Pt received from ICU RN. Pt oriented to room and equipment. Educated patient on walking 3 times per day, goals of care, and pathways to return home. Pt VSS, telemetry applied, CCMD notified x2. Call light within reach, will continue to monitor.   Fritz Pickerel, RN

## 2015-08-19 NOTE — Progress Notes (Signed)
2 Days Post-Op Procedure(s) (LRB): CORONARY ARTERY BYPASS GRAFTING (CABG) times five, LIMA to LAD, SVG to Diagonal sequentially SVG to OM-PL and Left Radial Artery to PD (N/A) RADIAL ARTERY HARVEST (Left) TRANSESOPHAGEAL ECHOCARDIOGRAM (TEE) (N/A) Subjective: "sore all over"  Objective: Vital signs in last 24 hours: Temp:  [98.2 F (36.8 C)-99.5 F (37.5 C)] 98.2 F (36.8 C) (08/18 0400) Pulse Rate:  [70-94] 70 (08/18 0700) Cardiac Rhythm: Normal sinus rhythm (08/18 0400) Resp:  [10-23] 12 (08/18 0700) BP: (83-116)/(57-76) 94/62 (08/18 0700) SpO2:  [90 %-99 %] 93 % (08/18 0700) Arterial Line BP: (126)/(58) 126/58 (08/17 0800) Weight:  [202 lb 13.2 oz (92 kg)] 202 lb 13.2 oz (92 kg) (08/18 0500)  Hemodynamic parameters for last 24 hours: PAP: (26)/(16) 26/16  Intake/Output from previous day: 08/17 0701 - 08/18 0700 In: 2704.9 [P.O.:2080; I.V.:524.9; IV Piggyback:100] Out: 945 [Urine:895; Chest Tube:50] Intake/Output this shift: No intake/output data recorded.  General appearance: alert, cooperative and no distress Neurologic: intact Heart: regular rate and rhythm Lungs: diminished breath sounds bibasilar Abdomen: normal findings: soft, non-tender  Lab Results:  Recent Labs  08/18/15 1610 08/18/15 1612 08/19/15 0304  WBC 13.6*  --  12.2*  HGB 11.2* 11.2* 10.1*  HCT 33.8* 33.0* 31.1*  PLT 174  --  161   BMET:  Recent Labs  08/18/15 0259  08/18/15 1612 08/19/15 0304  NA 138  --  139 134*  K 4.0  --  4.1 4.1  CL 110  --  100* 103  CO2 24  --   --  27  GLUCOSE 113*  --  130* 130*  BUN 12  --  18 17  CREATININE 0.96  < > 1.00 1.08  CALCIUM 8.4*  --   --  8.5*  < > = values in this interval not displayed.  PT/INR:  Recent Labs  08/17/15 1522  LABPROT 17.0*  INR 1.38   ABG    Component Value Date/Time   PHART 7.324 (L) 08/17/2015 1959   HCO3 23.6 08/17/2015 1959   TCO2 25 08/18/2015 1612   ACIDBASEDEF 3.0 (H) 08/17/2015 1959   O2SAT 96.0 08/17/2015  1959   CBG (last 3)   Recent Labs  08/18/15 1914 08/18/15 2344 08/19/15 0404  GLUCAP 130* 128* 138*    Assessment/Plan: S/P Procedure(s) (LRB): CORONARY ARTERY BYPASS GRAFTING (CABG) times five, LIMA to LAD, SVG to Diagonal sequentially SVG to OM-PL and Left Radial Artery to PD (N/A) RADIAL ARTERY HARVEST (Left) TRANSESOPHAGEAL ECHOCARDIOGRAM (TEE) (N/A) Plan for transfer to step-down: see transfer orders  POD # 2 CV- maintaining SR, BP a little soft, hold off on ACE until BP improves  ASA, lipitor, lopressor  RESP_ IS for atelectasis  RENAL- creatinine and lytes OK  Weight up about 10 pounds, should spontaneously diurese  Dc Foley  ENDO- CBG well controlled, change to AC/HS  DVT prophylaxis- enoxaparin + SCD  Dc central line  Continue ambulation  LOS: 2 days    [name] [name] 08/19/2015

## 2015-08-19 NOTE — Progress Notes (Signed)
CARDIAC REHAB PHASE I   PRE:  Rate/Rhythm: *75 SR  BP:  Sitting: 105/64        SaO2: 97 RA  MODE:  Ambulation: 690 ft   POST:  Rate/Rhythm: 73 SR  BP:  Sitting: 123/73         SaO2: 96 RA  Pt ambulated 690 ft on RA, hand held assist, steady gait, tolerated well. Pt c/o mild DOE, feeling "a little winded," denies any other complaints, declined rest stop. Pt to bed per pt request after walk, call bell within reach. Pt states he hopes to go home Sunday, will need education tomorrow. Pt and wife aware. Will follow-up tomorrow.  [place], RN, BSN 08/19/2015 3:27 PM

## 2015-08-19 NOTE — Progress Notes (Signed)
Pt complaining of dressing on R neck from removal of R IJ. Educated patient and sister at bedside that dressing must remain for 24 hours. Changed dressing per patient request.   Fritz Pickerel, RN

## 2015-08-20 ENCOUNTER — Inpatient Hospital Stay (HOSPITAL_COMMUNITY): Payer: BLUE CROSS/BLUE SHIELD

## 2015-08-20 LAB — BASIC METABOLIC PANEL
Anion gap: 5 (ref 5–15)
BUN: 17 mg/dL (ref 6–20)
CO2: 29 mmol/L (ref 22–32)
Calcium: 8.7 mg/dL — ABNORMAL LOW (ref 8.9–10.3)
Chloride: 101 mmol/L (ref 101–111)
Creatinine, Ser: 1.15 mg/dL (ref 0.61–1.24)
GFR calc Af Amer: >60 mL/min (ref >60)
GFR calc non Af Amer: >60 mL/min (ref >60)
Glucose, Bld: 117 mg/dL — ABNORMAL HIGH (ref 65–99)
Potassium: 4.4 mmol/L (ref 3.5–5.1)
Sodium: 135 mmol/L (ref 135–145)

## 2015-08-20 LAB — GLUCOSE, CAPILLARY
Glucose-Capillary: 120 mg/dL — ABNORMAL HIGH (ref 65–99)
Glucose-Capillary: 133 mg/dL — ABNORMAL HIGH (ref 65–99)
Glucose-Capillary: 115 mg/dL — ABNORMAL HIGH (ref 65–99)
Glucose-Capillary: 128 mg/dL — ABNORMAL HIGH (ref 65–99)

## 2015-08-20 LAB — CBC
HCT: 27 % — ABNORMAL LOW (ref 39.0–52.0)
Hemoglobin: 8.9 g/dL — ABNORMAL LOW (ref 13.0–17.0)
MCH: 30.2 pg (ref 26.0–34.0)
MCHC: 33 g/dL (ref 30.0–36.0)
MCV: 91.5 fL (ref 78.0–100.0)
Platelets: 127 10*3/uL — ABNORMAL LOW (ref 150–400)
RBC: 2.95 MIL/uL — ABNORMAL LOW (ref 4.22–5.81)
RDW: 12.7 % (ref 11.5–15.5)
WBC: 9.3 10*3/uL (ref 4.0–10.5)

## 2015-08-20 NOTE — Discharge Instructions (Signed)
Endoscopic Saphenous Vein Harvesting, Care After °Refer to this sheet in the next few weeks. These instructions provide you with information on caring for yourself after your procedure. Your health care provider may also give you more specific instructions. Your treatment has been planned according to current medical practices, but problems sometimes occur. Call your health care provider if you have any problems or questions after your procedure. °HOME CARE INSTRUCTIONS °Medicine °· Take whatever pain medicine your surgeon prescribes. Follow the directions carefully. Do not take over-the-counter pain medicine unless your surgeon says it is okay. Some pain medicine can cause bleeding problems for several weeks after surgery. °· Follow your surgeon's instructions about driving. You will probably not be permitted to drive after heart surgery. °· Take any medicines your surgeon prescribes. Any medicines you took before your heart surgery should be checked with your health care provider before you start taking them again. °Wound care °· If your surgeon has prescribed an elastic bandage or stocking, ask how long you should wear it. °· Check the area around your surgical cuts (incisions) whenever your bandages (dressings) are changed. Look for any redness or swelling. °· You will need to return to have the stitches (sutures) or staples taken out. Ask your surgeon when to do that. °· Ask your surgeon when you can shower or bathe. °Activity °· Try to keep your legs raised when you are sitting. °· Do any exercises your health care providers have given you. These may include deep breathing exercises, coughing, walking, or other exercises. °SEEK MEDICAL CARE IF: °· You have any questions about your medicines. °· You have more leg pain, especially if your pain medicine stops working. °· New or growing bruises develop on your leg. °· Your leg swells, feels tight, or becomes red. °· You have numbness in your leg. °SEEK IMMEDIATE  MEDICAL CARE IF: °· Your pain gets much worse. °· Blood or fluid leaks from any of the incisions. °· Your incisions become warm, swollen, or red. °· You have chest pain. °· You have trouble breathing. °· You have a fever. °· You have more pain near your leg incision. °MAKE SURE YOU: °· Understand these instructions. °· Will watch your condition. °· Will get help right away if you are not doing well or get worse. °  °This information is not intended to replace advice given to you by your health care provider. Make sure you discuss any questions you have with your health care provider. °  °Document Released: 08/30/2010 Document Revised: 01/08/2014 Document Reviewed: 08/30/2010 °Elsevier Interactive Patient Education ©2016 [employer] °Coronary Artery Bypass Grafting, Care After °These instructions give you information on caring for yourself after your procedure. Your doctor may also give you more specific instructions. Call your doctor if you have any problems or questions after your procedure.  °HOME CARE °· Only take medicine as told by your doctor. Take medicines exactly as told. Do not stop taking medicines or start any new medicines without talking to your doctor first. °· Take your pulse as told by your doctor. °· Do deep breathing as told by your doctor. Use your breathing device (incentive spirometer), if given, to practice deep breathing several times a day. Support your chest with a pillow or your arms when you take deep breaths or cough. °· Keep the area clean, dry, and protected where the surgery cuts (incisions) were made. Remove bandages (dressings) only as told by your doctor. If strips were applied to surgical area, do not take   them off. They fall off on their own. °· Check the surgery area daily for puffiness (swelling), redness, or leaking fluid. °· If surgery cuts were made in your legs: °· Avoid crossing your legs. °· Avoid sitting for long periods of time. Change positions every 30  minutes. °· Raise your legs when you are sitting. Place them on pillows. °· Wear stockings that help keep blood clots from forming in your legs (compression stockings). °· Only take sponge baths until your doctor says it is okay to take showers. Pat the surgery area dry. Do not rub the surgery area with a washcloth or towel. Do not bathe, swim, or use a hot tub until your doctor says it is okay. °· Eat foods that are high in fiber. These include raw fruits and vegetables, whole grains, beans, and nuts. Choose lean meats. Avoid canned, processed, and fried foods. °· Drink enough fluids to keep your pee (urine) clear or pale yellow. °· Weigh yourself every day. °· Rest and limit activity as told by your doctor. You may be told to: °· Stop any activity if you have chest pain, shortness of breath, changes in heartbeat, or dizziness. Get help right away if this happens. °· Move around often for short amounts of time or take short walks as told by your doctor. Gradually become more active. You may need help to strengthen your muscles and build endurance. °· Avoid lifting, pushing, or pulling anything heavier than 10 pounds (4.5 kg) for at least 6 weeks after surgery. °· Do not drive until your doctor says it is okay. °· Ask your doctor when you can go back to work. °· Ask your doctor when you can begin sexual activity again. °· Follow up with your doctor as told. °GET HELP IF: °· You have puffiness, redness, more pain, or fluid draining from the incision site. °· You have a fever. °· You have puffiness in your ankles or legs. °· You have pain in your legs. °· You gain 2 or more pounds (0.9 kg) a day. °· You feel sick to your stomach (nauseous) or throw up (vomit). °· You have watery poop (diarrhea). °GET HELP RIGHT AWAY IF: °· You have chest pain that goes to your jaw or arms. °· You have shortness of breath. °· You have a fast or irregular heartbeat. °· You notice a "clicking" in your breastbone when you move. °· You  have numbness or weakness in your arms or legs. °· You feel dizzy or light-headed. °MAKE SURE YOU: °· Understand these instructions. °· Will watch your condition. °· Will get help right away if you are not doing well or get worse. °  °This information is not intended to replace advice given to you by your health care provider. Make sure you discuss any questions you have with your health care provider. °  °Document Released: 12/23/2012 Document Reviewed: 12/23/2012 °Elsevier Interactive Patient Education ©2016 [employer] Inc[employer] ° °

## 2015-08-20 NOTE — Progress Notes (Signed)
08/20/2015 EPW D/C'd per order and per protocol.  Ends intact. Pt. Tolerated well.  Advised bedrest x1hr.  Call bell in reach.  Vital signs collected per protocol. [place]

## 2015-08-20 NOTE — Progress Notes (Signed)
Patient is ambulating without difficulty 3 times daily.  Education completed re: sternal precautions, signs and symptoms of infection, nutrition tips, activity restrictions, and exercise progression.  Referred to phase II cardiac rehab in [facility], [place], this is easier for patient to attend.  Patient and wife are smokers, stressed importance of cessation.   [phone]

## 2015-08-20 NOTE — Progress Notes (Addendum)
[address]       [place] [address]             [phone]      3 Days Post-Op Procedure(s) (LRB): CORONARY ARTERY BYPASS GRAFTING (CABG) times five, LIMA to LAD, SVG to Diagonal sequentially SVG to OM-PL and Left Radial Artery to PD (N/A) RADIAL ARTERY HARVEST (Left) TRANSESOPHAGEAL ECHOCARDIOGRAM (TEE) (N/A) Subjective: Looks and feels very well  Objective: Vital signs in last 24 hours: Temp:  [97.9 F (36.6 C)-99.1 F (37.3 C)] 99.1 F (37.3 C) (08/19 0544) Pulse Rate:  [71-82] 82 (08/19 0544) Cardiac Rhythm: Normal sinus rhythm (08/19 0700) Resp:  [15-18] 17 (08/19 0544) BP: (84-107)/(49-68) 102/68 (08/19 0544) SpO2:  [94 %-99 %] 94 % (08/19 0544) Weight:  [201 lb 1 oz (91.2 kg)] 201 lb 1 oz (91.2 kg) (08/19 0544)  Hemodynamic parameters for last 24 hours:    Intake/Output from previous day: 08/18 0701 - 08/19 0700 In: 390 [P.O.:360; I.V.:30] Out: 600 [Urine:600] Intake/Output this shift: No intake/output data recorded.  General appearance: alert, cooperative and no distress Heart: regular rate and rhythm Lungs: clear to auscultation bilaterally Abdomen: benign Extremities: no edema Wound: incis healing well, left hand N/V intact  Lab Results:  Recent Labs  08/19/15 0304 08/20/15 0238  WBC 12.2* 9.3  HGB 10.1* 8.9*  HCT 31.1* 27.0*  PLT 161 127*   BMET:  Recent Labs  08/19/15 0304 08/20/15 0238  NA 134* 135  K 4.1 4.4  CL 103 101  CO2 27 29  GLUCOSE 130* 117*  BUN 17 17  CREATININE 1.08 1.15  CALCIUM 8.5* 8.7*    PT/INR:  Recent Labs  08/17/15 1522  LABPROT 17.0*  INR 1.38   ABG    Component Value Date/Time   PHART 7.324 (L) 08/17/2015 1959   HCO3 23.6 08/17/2015 1959   TCO2 25 08/18/2015 1612   ACIDBASEDEF 3.0 (H) 08/17/2015 1959   O2SAT 96.0 08/17/2015 1959   CBG (last 3)   Recent Labs  08/19/15 1613 08/19/15 2124 08/20/15 0641  GLUCAP 131* 132* 120*    Meds Scheduled Meds: .  acetaminophen  1,000 mg Oral Q6H  . aspirin EC  325 mg Oral Daily  . atorvastatin  80 mg Oral q1800  . bisacodyl  10 mg Oral Daily   Or  . bisacodyl  10 mg Rectal Daily  . docusate sodium  200 mg Oral Daily  . enoxaparin (LOVENOX) injection  40 mg Subcutaneous QHS  . insulin aspart  0-15 Units Subcutaneous TID WC  . isosorbide mononitrate  30 mg Oral Daily  . metoprolol tartrate  12.5 mg Oral BID  . moving right along book   Does not apply Once  . pantoprazole  40 mg Oral Daily  . sodium chloride flush  3 mL Intravenous Q12H   Continuous Infusions:  PRN Meds:.sodium chloride, alum & mag hydroxide-simeth, clonazePAM, magnesium hydroxide, ondansetron (ZOFRAN) IV, oxyCODONE, sodium chloride flush, traMADol  Xrays Dg Chest Port 1 View  Result Date: 08/19/2015 CLINICAL DATA:  CABG.  Sore chest. EXAM: PORTABLE CHEST 1 VIEW COMPARISON:  08/18/2015. FINDINGS: Right IJ sheath in stable position. Swan-Ganz catheter, mediastinal drainage catheter, left chest tube in stable position. Prior CABG. Cardiomegaly with normal pulmonary vascularity. Bilateral pulmonary infiltrates noted consistent pulmonary edema. Small left pleural effusion . IMPRESSION: 1. Right IJ sheath in stable position. Interim removal of Swan-Ganz catheter, mediastinal drainage catheter, and left chest tube. 2. Prior CABG. Cardiomegaly  with bilateral pulmonary infiltrates consistent with pulmonary edema noted on today's exam. Small left pleural effusion. 3.  Low lung volumes. Electronically Signed   By: Marcello Moores  Register   On: 08/19/2015 07:12    Assessment/Plan: S/P Procedure(s) (LRB): CORONARY ARTERY BYPASS GRAFTING (CABG) times five, LIMA to LAD, SVG to Diagonal sequentially SVG to OM-PL and Left Radial Artery to PD (N/A) RADIAL ARTERY HARVEST (Left) TRANSESOPHAGEAL ECHOCARDIOGRAM (TEE) (N/A)   1 excellent progress 2 H/H down a little- follow, probably equilibration, other labs stable 3 BP runs low- no ace-inhib for now, may  need to decrease imdur 4 sugars controlled- Hg A1c 5.2 5 rhythm sinus- d/c PW's 6 probable home in am if no changes  LOS: 3 days    GOLD,WAYNE E 08/20/2015  Patient seen and examined, agree with above Home tomorrow if no issues  Remo Lipps C. Roxan Hockey, MD [facility] [phone]

## 2015-08-20 NOTE — Discharge Summary (Signed)
Physician Discharge Summary  Patient ID: LAWERENCE PRUDEN MRN: [id number] DOB/AGE: Jun 18, 1965 50 y.o.  Admit date: 08/17/2015 Discharge date: 08/22/2015  Admission Diagnoses: Coronary artery disease  Discharge Diagnoses:  Active Problems:   S/P CABG x 5  Patient Active Problem List   Diagnosis Date Noted  . S/P CABG x 5 08/17/2015  . Abnormal nuclear stress test 08/11/2015  . Angina pectoris ([place])   . GERD (gastroesophageal reflux disease) 10/12/2010  . EPIGASTRIC PAIN 12/08/2009  . Hyperlipidemia 12/29/2008  . ANXIETY 11/10/2008  . 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 Mannis is a 50 year old man sent for consultation for three-vessel coronary disease.  Mr. Canuto is a 50 year old man with a strong family history of coronary disease, borderline high cholesterol, and a history of tobacco abuse. He has no prior history of coronary disease. 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 This resolved with rest. He says that he has noted this before. On July 4 he was swimming and he felt a sensation of left-sided chest pressure and his heart racing. Because of his strong family history he mention this to Dr. Elease Hashimoto. He did a stress Myoview to further evaluate due to his family history. It showed a defect of moderate severity in the basal inferoseptal, basal inferior, mid inferior and apical inferior walls. It was interpreted as an intermediate risk study.  He saw Dr. [name]. He recommended cardiac catheterization. That was done yesterday by Dr. Tamala Julian. It showed severe diffuse three-vessel coronary disease with preserved left ventricular function.  He has not had any rest or nocturnal symptoms. In fact he has only had symptoms with very heavy exertion. He is in disbelief that his coronary disease is of the severity that was found. He does not have orthopnea, paroxysmal dyspnea, or peripheral edema. He has not had any  syncope or presyncope. He denies claudication.  He was admitted to the hospital electively for the procedure.    Discharged Condition: good  Hospital Course: The patient has progressed overall quite nicely. He has maintained stable hemodynamics throughout in sinus rhythm. He does have an expected acute blood loss anemia and values are stable. All routine lines, monitors and drainage devices have been discontinued in the standard fashion. The left arm remains neurologically and vascularly intact following radial artery harvest. He is currently on Imdur  as an anti-spasm agent which will continue for one month postoperatively. Incisions are noted be healing well without evidence of infection. He has mild postoperative volume overload but has responded well to diuretics. He is tolerating gradually increasing activities using standard cardiac rehabilitation protocols. Oxygen has been weaned and he maintains good saturations on room air. At time of discharge he is felt to be quite stable.  Consults: None  Significant Diagnostic Studies: Routine postoperative laboratory and serial chest x-ray  Treatments: surgery:    DATE OF PROCEDURE:  08/17/2015 DATE OF DISCHARGE:                              OPERATIVE REPORT   PREOPERATIVE DIAGNOSIS:  Severe three-vessel coronary artery disease with positive stress test.  POSTOPERATIVE DIAGNOSIS:  Severe three-vessel coronary artery disease with positive stress test.  PROCEDURE:  Median sternotomy, extracorporeal circulation, coronary artery bypass grafting x5 (left internal mammary artery to LAD, saphenous vein graft to first diagonal, sequential saphenous vein graft to obtuse marginal 1 and posterior  lateral 2, left radial artery to posterior descending) endoscopic vein harvest, left leg.  SURGEON:  Revonda Standard. Roxan Hockey, M.D.  ASSISTANTAhmed Prima Erskine Speed, and Nicholes Rough; all of them are PA's.  ANESTHESIA:   General.  FINDINGS:  Transesophageal echocardiography revealed preserved left ventricular wall motion with no significant valvular pathology.  Right vein small, not harvested; left vein, good quality.  Left radial and mammary artery good quality.  Diagonal small poor quality target.  The remaining targets, good quality. Discharge Exam: Blood pressure 112/68, pulse 72, temperature 98.2 F (36.8 C), temperature source Oral, resp. rate 18, height 6' (1.829 m), weight 198 lb 1.6 oz (89.9 kg), SpO2 98 %.  General appearance: alert, cooperative and no distress Heart: regular rate and rhythm Lungs: clear to auscultation bilaterally Abdomen: benign Extremities: minor edema Wound: incis healing well, left hand N/V intact  Disposition: 01-Home or Self Care     Medication List    STOP taking these medications   BC HEADACHE POWDER PO   nitroGLYCERIN 0.4 MG SL tablet Commonly known as:  NITROSTAT     TAKE these medications   aspirin 325 MG EC tablet Take 1 tablet (325 mg total) by mouth daily. What changed:  medication strength  how much to take   atorvastatin 80 MG tablet Commonly known as:  LIPITOR Take 1 tablet (80 mg total) by mouth daily.   clonazePAM 1 MG tablet Commonly known as:  KLONOPIN TAKE 1 TABLET BY MOUTH TWICE A DAY   isosorbide mononitrate 30 MG 24 hr tablet Commonly known as:  IMDUR Take 1 tablet (30 mg total) by mouth daily.   metoprolol tartrate 25 MG tablet Commonly known as:  LOPRESSOR Take 0.5 tablets (12.5 mg total) by mouth 2 (two) times daily.   oxyCODONE 5 MG immediate release tablet Commonly known as:  Oxy IR/ROXICODONE Take 1-2 tablets (5-10 mg total) by mouth every 6 (six) hours as needed for severe pain.   pantoprazole 40 MG tablet Commonly known as:  PROTONIX TAKE 1 TABLET BY MOUTH EVERY DAY      Follow-up Information    Melrose Nakayama, MD .   Specialty:  Cardiothoracic Surgery Why:  office will contact Contact  information: [place] [address] [phone]        Jenkins Rouge, MD .   Specialty:  Cardiology Why:  cardiology office will contact you Contact information: [id number] [place] [address] [phone]          The patient has been discharged on:   1.Beta Blocker:  Yes [ y  ]                              No   [   ]                              If No, reason:  2.Ace Inhibitor/ARB: Yes [   ]                                     No  [ n   ]  If No, reason:low bp  3.Statin:   Yes Blue.Reese   ]                  No  [   ]                  If No, reason:  4.Ecasa:  Yes  [  y ]                  No   [   ]                  If No, reason:    Signed: GOLD,WAYNE E 08/22/2015, 8:40 AM

## 2015-08-21 LAB — CBC
HCT: 26.4 % — ABNORMAL LOW (ref 39.0–52.0)
Hemoglobin: 8.8 g/dL — ABNORMAL LOW (ref 13.0–17.0)
MCH: 30.7 pg (ref 26.0–34.0)
MCHC: 33.3 g/dL (ref 30.0–36.0)
MCV: 92 fL (ref 78.0–100.0)
Platelets: 151 10*3/uL (ref 150–400)
RBC: 2.87 MIL/uL — ABNORMAL LOW (ref 4.22–5.81)
RDW: 12.9 % (ref 11.5–15.5)
WBC: 8.5 10*3/uL (ref 4.0–10.5)

## 2015-08-21 LAB — GLUCOSE, CAPILLARY: Glucose-Capillary: 102 mg/dL — ABNORMAL HIGH (ref 65–99)

## 2015-08-21 MED ORDER — ISOSORBIDE MONONITRATE ER 30 MG PO TB24: 30.0000 mg | ORAL_TABLET | Freq: Every day | ORAL | 0 refills | Status: DC

## 2015-08-21 MED ORDER — ASPIRIN 325 MG PO TBEC: 325.0000 mg | DELAYED_RELEASE_TABLET | Freq: Every day | ORAL | Status: DC

## 2015-08-21 MED ORDER — OXYCODONE HCL 5 MG PO TABS: 5.0000 mg | ORAL_TABLET | Freq: Four times a day (QID) | ORAL | 0 refills | Status: DC | PRN

## 2015-08-21 MED ORDER — METOPROLOL TARTRATE 25 MG PO TABS: 12.5000 mg | ORAL_TABLET | Freq: Two times a day (BID) | ORAL | 1 refills | Status: DC

## 2015-08-21 NOTE — Progress Notes (Addendum)
[address]       [place] [address]             [phone]      4 Days Post-Op Procedure(s) (LRB): CORONARY ARTERY BYPASS GRAFTING (CABG) times five, LIMA to LAD, SVG to Diagonal sequentially SVG to OM-PL and Left Radial Artery to PD (N/A) RADIAL ARTERY HARVEST (Left) TRANSESOPHAGEAL ECHOCARDIOGRAM (TEE) (N/A) Subjective: Feels well overall  Objective: Vital signs in last 24 hours: Temp:  [98.2 F (36.8 C)-98.4 F (36.9 C)] 98.2 F (36.8 C) (08/20 [id number]) Pulse Rate:  [70-88] 72 (08/20 [id number]) Cardiac Rhythm: Normal sinus rhythm;Bundle branch block (08/19 1955) Resp:  [18] 18 (08/20 0632) BP: (94-112)/(48-68) 112/68 (08/20 [id number]) SpO2:  [95 %-98 %] 98 % (08/20 [id number]) Weight:  [198 lb 1.6 oz (89.9 kg)] 198 lb 1.6 oz (89.9 kg) (08/20 [id number])  Hemodynamic parameters for last 24 hours:    Intake/Output from previous day: 08/19 0701 - 08/20 0700 In: 603 [P.O.:600; I.V.:3] Out: 1000 [Urine:1000] Intake/Output this shift: No intake/output data recorded.  General appearance: alert, cooperative and no distress Heart: regular rate and rhythm Lungs: clear to auscultation bilaterally Abdomen: benign Extremities: minor edema Wound: incis healing well, left hand N/V intact  Lab Results:  Recent Labs  08/20/15 0238 08/21/15 0223  WBC 9.3 8.5  HGB 8.9* 8.8*  HCT 27.0* 26.4*  PLT 127* 151   BMET:  Recent Labs  08/19/15 0304 08/20/15 0238  NA 134* 135  K 4.1 4.4  CL 103 101  CO2 27 29  GLUCOSE 130* 117*  BUN 17 17  CREATININE 1.08 1.15  CALCIUM 8.5* 8.7*    PT/INR: No results for input(s): LABPROT, INR in the last 72 hours. ABG    Component Value Date/Time   PHART 7.324 (L) 08/17/2015 1959   HCO3 23.6 08/17/2015 1959   TCO2 25 08/18/2015 1612   ACIDBASEDEF 3.0 (H) 08/17/2015 1959   O2SAT 96.0 08/17/2015 1959   CBG (last 3)   Recent Labs  08/20/15 1630 08/20/15 2137 08/21/15 0630  GLUCAP 115* 128* 102*    Meds Scheduled  Meds: . acetaminophen  1,000 mg Oral Q6H  . aspirin EC  325 mg Oral Daily  . atorvastatin  80 mg Oral q1800  . bisacodyl  10 mg Oral Daily   Or  . bisacodyl  10 mg Rectal Daily  . docusate sodium  200 mg Oral Daily  . enoxaparin (LOVENOX) injection  40 mg Subcutaneous QHS  . isosorbide mononitrate  30 mg Oral Daily  . metoprolol tartrate  12.5 mg Oral BID  . moving right along book   Does not apply Once  . pantoprazole  40 mg Oral Daily  . sodium chloride flush  3 mL Intravenous Q12H   Continuous Infusions:  PRN Meds:.sodium chloride, alum & mag hydroxide-simeth, clonazePAM, magnesium hydroxide, ondansetron (ZOFRAN) IV, oxyCODONE, sodium chloride flush, traMADol  Xrays Dg Chest 2 View  Result Date: 08/20/2015 CLINICAL DATA:  Atelectasis.  Shortness of breath. EXAM: CHEST  2 VIEW COMPARISON:  08/19/2015 FINDINGS: Lung base opacity has mildly improved from the prior exam. There still persistent linear and discoid type opacity consistent with atelectasis, left greater than right. There are associated small pleural effusions. No evidence of pulmonary edema.  No pneumothorax. Changes from the recent CABG surgery are stable. Cardiac silhouette is normal size. No mediastinal widening. IMPRESSION: 1. No acute findings or evidence of a complication following CABG surgery. 2. Left greater than right  lung base atelectasis with small effusions, improved from the previous day's exam. 3. No pneumothorax, pulmonary edema or mediastinal widening. Electronically Signed   By: Lajean Manes M.D.   On: 08/20/2015 07:20    Assessment/Plan: S/P Procedure(s) (LRB): CORONARY ARTERY BYPASS GRAFTING (CABG) times five, LIMA to LAD, SVG to Diagonal sequentially SVG to OM-PL and Left Radial Artery to PD (N/A) RADIAL ARTERY HARVEST (Left) TRANSESOPHAGEAL ECHOCARDIOGRAM (TEE) (N/A) Plan for discharge: see discharge orders Very stable on current rx- no ACE-I for now with low BP  LOS: 4 days    GOLD,WAYNE  E 08/21/2015  Patient seen and examined, agree with above  Remo Lipps C. Roxan Hockey, MD [facility] [phone]

## 2015-08-21 NOTE — Progress Notes (Signed)
Order received to discharge Pt.  Telemetry removed and CCMD notified.  IV removed with catheter intact.  Discharge education with Pt.  Discussed new medications, Imdur and metoprolol.  All questions answered.  Pt revealed he takes low dose Cialis.  Advised Pt to not take any further Cialis until he follows up with surgeon.  Pt indicates understanding.  Pt states he still has some sob with walking.  Obtained flutter valve for Pt to take home.  Advised Pt to continue to use IS and flutter valve at home.  Advised Pt to follow cardiac rehab recommendations to slowly increase activity.  Pt indicates understanding.  Pt stable at this time to discharge.

## 2015-08-21 NOTE — Progress Notes (Signed)
Order received to DC chest tubes.  Chest tubes removed and covered with steristrips.  Pt tol well.

## 2015-08-24 ENCOUNTER — Other Ambulatory Visit: Payer: Self-pay | Admitting: *Deleted

## 2015-08-24 ENCOUNTER — Ambulatory Visit (HOSPITAL_COMMUNITY)
Admission: RE | Admit: 2015-08-24 | Discharge: 2015-08-24 | Disposition: A | Discharge status: Home / Self Care | Payer: BLUE CROSS/BLUE SHIELD | Source: Ambulatory Visit | Attending: Thoracic Surgery (Cardiothoracic Vascular Surgery) | Admitting: Thoracic Surgery (Cardiothoracic Vascular Surgery)

## 2015-08-24 DIAGNOSIS — M7989 Other specified soft tissue disorders: Secondary | ICD-10-CM | POA: Diagnosis not present

## 2015-08-24 NOTE — Progress Notes (Addendum)
*  PRELIMINARY RESULTS* Vascular Ultrasound Left lower extremity venous duplex has been completed.  Preliminary findings: No evidence of DVT or baker's cyst. Small fluid collection noted at medial left knee - area of GSV harvest.   Attempted call report to Dr. Roxan Hockey. Left voice message with results on RN line.   Landry Mellow, RDMS, RVT  08/24/2015, 1:16 PM

## 2015-08-25 ENCOUNTER — Encounter: Payer: Self-pay | Admitting: Physician Assistant

## 2015-08-31 ENCOUNTER — Other Ambulatory Visit: Payer: Self-pay | Admitting: *Deleted

## 2015-08-31 DIAGNOSIS — G8918 Other acute postprocedural pain: Secondary | ICD-10-CM

## 2015-08-31 MED ORDER — OXYCODONE HCL 5 MG PO TABS: 5.0000 mg | ORAL_TABLET | Freq: Four times a day (QID) | ORAL | 0 refills | Status: DC | PRN

## 2015-08-31 NOTE — Telephone Encounter (Signed)
Mrs. Boren has called the office requesting a refill for Oxycodone for her husband, s/p CABG 08/17/15,  discharged on 08/22/15. I informed her that a script would be available at our office today and she understood.

## 2015-09-08 ENCOUNTER — Encounter (INDEPENDENT_AMBULATORY_CARE_PROVIDER_SITE_OTHER): Payer: Self-pay

## 2015-09-08 ENCOUNTER — Encounter: Payer: Self-pay | Admitting: Physician Assistant

## 2015-09-08 ENCOUNTER — Ambulatory Visit (INDEPENDENT_AMBULATORY_CARE_PROVIDER_SITE_OTHER): Payer: BLUE CROSS/BLUE SHIELD | Admitting: Physician Assistant

## 2015-09-08 VITALS — BP 102/70 | HR 62 | Ht 72.0 in | Wt 194.8 lb

## 2015-09-08 DIAGNOSIS — I209 Angina pectoris, unspecified: Secondary | ICD-10-CM

## 2015-09-08 DIAGNOSIS — E785 Hyperlipidemia, unspecified: Secondary | ICD-10-CM | POA: Diagnosis not present

## 2015-09-08 DIAGNOSIS — Z951 Presence of aortocoronary bypass graft: Secondary | ICD-10-CM | POA: Diagnosis not present

## 2015-09-08 DIAGNOSIS — I25810 Atherosclerosis of coronary artery bypass graft(s) without angina pectoris: Secondary | ICD-10-CM | POA: Diagnosis not present

## 2015-09-08 DIAGNOSIS — Z4802 Encounter for removal of sutures: Secondary | ICD-10-CM

## 2015-09-08 LAB — HEPATIC FUNCTION PANEL
ALT: 28 U/L (ref 9–46)
AST: 28 U/L (ref 10–35)
Albumin: 4.1 g/dL (ref 3.6–5.1)
Alkaline Phosphatase: 214 U/L — ABNORMAL HIGH (ref 40–115)
Bilirubin, Direct: 0.1 mg/dL (ref <=0.2)
Indirect Bilirubin: 0.4 mg/dL (ref 0.2–1.2)
Total Bilirubin: 0.5 mg/dL (ref 0.2–1.2)
Total Protein: 7.6 g/dL (ref 6.1–8.1)

## 2015-09-08 LAB — LIPID PANEL
Cholesterol: 138 mg/dL (ref 125–200)
HDL: 38 mg/dL — ABNORMAL LOW (ref >=40)
LDL Cholesterol: 81 mg/dL (ref <130)
Total CHOL/HDL Ratio: 3.6 Ratio (ref <=5.0)
Triglycerides: 97 mg/dL (ref <150)
VLDL: 19 mg/dL (ref <30)

## 2015-09-08 NOTE — Progress Notes (Addendum)
Cardiology Office Note    Date:  09/08/2015   ID:  [name], DOB 07/31/1965, MRN [id number]  PCP:  [name] Post, MD  Cardiologist:  Dr. [name]  Chief Complaint: Hospital follow up s/p  CABG  History of Present Illness:   [name] [name] is a 50 y.o. male with hx of CAD with recent CABG x5 08/2015, hyperlipidemia and tobacco abuse who presented for hospital follow up.   The patient had as SSCP after swimming vigorously. Follow up Myoview 07/29/15 was abnormal. It showed a defect of moderate severity in the basal inferoseptal, basal inferior, mid inferior and apical inferior walls. It was interpreted as an intermediate risk study. Cath 08/11/15 showed severe diffuse three-vessel coronary disease with preserved left ventricular function. Subsequently underwent CABG x 5 08/17/15 (left internal mammary artery to LAD, saphenous vein graft to first diagonal, sequential saphenous vein graft to obtuse marginal 1 and posterior lateral 2, left radial artery to posterior descending). No [name] operative complications. Maintained sinus rhythm. He has mild postoperative volume overload but has responded well to diuretics.  Presents today for follow up. Walking 1 miles/day without angina or dyspnea. No orthopnea, PND or syncope Asking about removing suture  from veins harvest sites. He was debating to enroll in CRP II either at [facility] or [facility].    Past Medical History:  Diagnosis Date  . Acute bronchitis 03/31/2009  . ANXIETY 11/10/2008  . Arthritis   . Coronary artery disease   . EPIGASTRIC PAIN 12/08/2009  . GERD (gastroesophageal reflux disease)   . 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 Other and unspecified hyperlipidemia 12/29/2008    Past Surgical History:  Procedure Laterality Date  . CARDIAC CATHETERIZATION N/A 08/11/2015   Procedure: Left Heart Cath and Coronary Angiography;  Surgeon: Nelva Bush, MD;  Location: [facility];  Service: Cardiovascular;  Laterality: N/A;  .  COLONOSCOPY    . CORONARY ARTERY BYPASS GRAFT N/A 08/17/2015   Procedure: CORONARY ARTERY BYPASS GRAFTING (CABG) times five, LIMA to LAD, SVG to Diagonal sequentially SVG to OM-PL and Left Radial Artery to PD;  Surgeon: Melrose Nakayama, MD;  Location: [facility];  Service: Open Heart Surgery;  Laterality: N/A;  . RADIAL ARTERY HARVEST Left 08/17/2015   Procedure: RADIAL ARTERY HARVEST;  Surgeon: Melrose Nakayama, MD;  Location: [facility];  Service: Open Heart Surgery;  Laterality: Left;  . TEE WITHOUT CARDIOVERSION N/A 08/17/2015   Procedure: TRANSESOPHAGEAL ECHOCARDIOGRAM (TEE);  Surgeon: Melrose Nakayama, MD;  Location: [facility];  Service: Open Heart Surgery;  Laterality: N/A;  . WISDOM TOOTH EXTRACTION  1989    Current Medications: Prior to Admission medications   Medication Sig Start Date End Date Taking? Authorizing Provider  aspirin EC 325 MG EC tablet Take 1 tablet (325 mg total) by mouth daily. 08/21/15   Wayne E Gold, PA-C  atorvastatin (LIPITOR) 80 MG tablet Take 1 tablet (80 mg total) by mouth daily. 08/11/15   Nelva Bush, MD  clonazePAM (KLONOPIN) 1 MG tablet TAKE 1 TABLET BY MOUTH TWICE A DAY 08/09/15   [name] Post, MD  isosorbide mononitrate (IMDUR) 30 MG 24 hr tablet Take 1 tablet (30 mg total) by mouth daily. 08/21/15   Wayne E Gold, PA-C  metoprolol tartrate (LOPRESSOR) 25 MG tablet Take 0.5 tablets (12.5 mg total) by mouth 2 (two) times daily. 08/21/15   Wayne E Gold, PA-C  oxyCODONE (OXY IR/ROXICODONE) 5 MG immediate release tablet Take 1-2 tablets (5-10 mg total)  by mouth every 6 (six) hours as needed for severe pain. 08/31/15   Gaye Pollack, MD  pantoprazole (PROTONIX) 40 MG tablet TAKE 1 TABLET BY MOUTH EVERY DAY 05/04/15   [name] Post, MD    Allergies:   No known allergies   Social History   Social History  . Marital status: Divorced    Spouse name: N/A  . Number of children: N/A  . Years of education: N/A   Social History Main Topics  . Smoking status:  Former Smoker    Packs/day: 1.00    Years: 8.00    Types: Cigarettes    Quit date: 08/14/2015  . Smokeless tobacco: Never Used  . Alcohol use 1.8 oz/week    3 Cans of beer per week     Comment: social  . Drug use: No  . Sexual activity: Not Asked   Other Topics Concern  . None   Social History Narrative  . None     Family History:  The patient's family history includes Cancer in his mother; Diabetes in his brother; Heart disease in his paternal aunt and paternal uncle; Heart disease (age of onset: 71) in his father.   ROS:   Please see the history of present illness.    ROS All other systems reviewed and are negative.   PHYSICAL EXAM:   VS:  BP 102/70   Pulse 62   Ht 6' (1.829 m)   Wt 194 lb 12.8 oz (88.4 kg)   BMI 26.42 kg/m    GEN: Well nourished, well developed, in no acute distress  HEENT: normal  Neck: no JVD, carotid bruits, or masses Cardiac: RRR; no murmurs, rubs, or gallops,no edema  Respiratory:  clear to auscultation bilaterally, normal work of breathing GI: soft, nontender, nondistended, + BS MS: no deformity or atrophy  Skin: healing surgical incision without erythema or edema.  Neuro:  Alert and Oriented x 3, Strength and sensation are intact Psych: euthymic mood, full affect  Wt Readings from Last 3 Encounters:  09/08/15 194 lb 12.8 oz (88.4 kg)  08/21/15 198 lb 1.6 oz (89.9 kg)  08/16/15 192 lb 11.2 oz (87.4 kg)      Studies/Labs Reviewed:   EKG:  EKG is ordered today.  The ekg ordered today demonstrates sinus rhythm at rate of 61 bpm. TWI in lead I and V1.   Recent Labs: 12/10/2014: TSH 1.00 08/16/2015: ALT 24 08/18/2015: Magnesium 2.3 08/20/2015: BUN 17; Creatinine, Ser 1.15; Potassium 4.4; Sodium 135 08/21/2015: Hemoglobin 8.8; Platelets 151   Lipid Panel    Component Value Date/Time   CHOL 193 12/10/2014 0757   TRIG 70.0 12/10/2014 0757   HDL 47.30 12/10/2014 0757   CHOLHDL 4 12/10/2014 0757   VLDL 14.0 12/10/2014 0757   LDLCALC 132  (H) 12/10/2014 0757   LDLDIRECT 157.6 12/21/2008 0831    Additional studies/ records that were reviewed today include:   Intraoperative Transesophageal Echocardiography 08/17/15 LV EF: 55% -   60%  ------------------------------------------------------------------- Study Conclusions  - Left ventricle: Systolic function was normal. The estimated   ejection fraction was in the range of 55% to 60%. - Aortic valve: No evidence of vegetation. - Mitral valve: No evidence of vegetation. - Left atrium: No evidence of thrombus in the atrial cavity or   appendage. No evidence of thrombus in the appendage. - Atrial septum: No defect or patent foramen ovale was identified.   Echo contrast study showed no right-to-left atrial level shunt,   following an  increase in RA pressure induced by provocative   maneuvers. - Tricuspid valve: No evidence of vegetation.    Cardiac Catheterization: 08/11/2015 Left Heart Cath and Coronary Angiography  Conclusion     RPDA lesion, 90 %stenosed.  Ost 1st Diag to 1st Diag lesion, 80 %stenosed.  2nd Diag lesion, 90 %stenosed.  The left ventricular ejection fraction is 55-65% by visual estimate.  The left ventricular systolic function is normal.  LV end diastolic pressure is normal.  Prox RCA to Mid RCA lesion, 70 %stenosed.  [name] Atrio lesion, 70 %stenosed.  3rd RPLB lesion, 90 %stenosed.  Ost LAD to Prox LAD lesion, 50 %stenosed.  1st Mrg lesion, 80 %stenosed.  LM lesion, 40 %stenosed.  Mid LAD to Dist LAD lesion, 90 %stenosed.   1.  Severe multivessel coronary artery disease involving the proximal/mid LAD, Diagonal 1 &2,  OM1, proximal/mid RCA, rPDA, and rPL branch. 2.  Normal left ventricular contraction. 3.  Upper normal left ventricular filling pressure.  Plan: 1.  Expedited outpatient cardiac surgery consultation for CABG tomorrow or early next week. 2.  Start ASA 81 mg daily, atorvastatin 80 mg daily, and prn sublingual  nitroglycerin.     ASSESSMENT & PLAN:    1. CAD s/p CABG x 5 -No anginal pain or dyspnea. EKG with non specific changes. Continue ASA 325mg  and Imdur--> Dr. Roxan Hockey to manage further. Will call [facility] office today to schedule suture removal. Has f/u on 09/20/15. Will referr to CRP II at [facility].   2. HLD - 12/10/2014: Cholesterol 193; HDL 47.30; LDL Cholesterol 132; Triglycerides 70.0; VLDL 14.0  - LDL goal less than 70. Will get lipid panel and LFT today.   3. Tobacco smoking - Has has quit. Congratulated.   Medication Adjustments/Labs and Tests Ordered: Current medicines are reviewed at length with the patient today.  Concerns regarding medicines are outlined above.  Medication changes, Labs and Tests ordered today are listed in the Patient Instructions below. Patient Instructions  Medication Instructions:  Your physician recommends that you continue on your current medications as directed. Please refer to the Current Medication list given to you today.   Labwork: TODAY:  LFT & LIPID PANEL  Testing/Procedures: None ordered  Follow-Up: Your physician recommends that you schedule a follow-up appointment in: 3 MONTHS WITH DR. [name]   Any Other Special Instructions Will Be Listed Below (If Applicable). You have been referred to Cardiac Rehab.  They will contact you to schedule the appointment.     If you need a refill on your cardiac medications before your next appointment, please call your pharmacy.      Jarrett Soho, [place]  09/08/2015 9:20 AM    [facility] [address], [place], [place]  [address] Phone: [phone]; Fax: [phone]

## 2015-09-08 NOTE — Patient Instructions (Addendum)
Medication Instructions:  Your physician recommends that you continue on your current medications as directed. Please refer to the Current Medication list given to you today.   Labwork: TODAY:  LFT & LIPID PANEL  Testing/Procedures: None ordered  Follow-Up: Your physician recommends that you schedule a follow-up appointment in: 3 MONTHS WITH DR. [name]   Any Other Special Instructions Will Be Listed Below (If Applicable). You have been referred to Cardiac Rehab.  They will contact you to schedule the appointment.     If you need a refill on your cardiac medications before your next appointment, please call your pharmacy.

## 2015-09-09 ENCOUNTER — Telehealth: Payer: Self-pay | Admitting: *Deleted

## 2015-09-09 ENCOUNTER — Telehealth: Payer: Self-pay | Admitting: Cardiovascular Disease

## 2015-09-09 DIAGNOSIS — R748 Abnormal levels of other serum enzymes: Secondary | ICD-10-CM

## 2015-09-09 NOTE — Telephone Encounter (Signed)
Returned pts wife call and discussed pt lab results. She verbalized appreciation and understanding.

## 2015-09-09 NOTE — Telephone Encounter (Signed)
-----   Message from [place], [place] sent at 09/09/2015  8:08 AM EDT ----- Cholesterol panel looks good. LDL trending down, goal of less then 70. Liver function normal except elevated alkaline phosphatase (liver and bone marker). Per discussion yesterday, he was asymptomatic. ? Any bone pain, abdominal pain (expecially right upper quadrant), nausea or vomiting. Repeat LFT in 2 weeks, if elevated at that time f/u with PCP. I don't think elevation is due to statin.

## 2015-09-09 NOTE — Telephone Encounter (Signed)
Pt aware of lab results.  He states that he is not having any symptoms, no abdominal pain, nausea, vomiting, or anything.   He will come by office 09/22/15 for repeat hepatic function and pt was advised that at that time if it was still elevated, we would send him back to his pcp for further work up. Pt agreeable with this plan. Hepatic lab order in EPIC.

## 2015-09-09 NOTE — Telephone Encounter (Signed)
Follow Up:     Wife said if possible please call her in an hour,pt did not understand the lab results you gave him.

## 2015-09-19 ENCOUNTER — Other Ambulatory Visit: Payer: Self-pay | Admitting: Thoracic Surgery (Cardiothoracic Vascular Surgery)

## 2015-09-19 DIAGNOSIS — Z951 Presence of aortocoronary bypass graft: Secondary | ICD-10-CM

## 2015-09-20 ENCOUNTER — Encounter: Payer: Self-pay | Admitting: Thoracic Surgery (Cardiothoracic Vascular Surgery)

## 2015-09-20 ENCOUNTER — Ambulatory Visit (INDEPENDENT_AMBULATORY_CARE_PROVIDER_SITE_OTHER): Payer: BLUE CROSS/BLUE SHIELD | Admitting: Thoracic Surgery (Cardiothoracic Vascular Surgery)

## 2015-09-20 ENCOUNTER — Ambulatory Visit
Admission: RE | Admit: 2015-09-20 | Discharge: 2015-09-20 | Disposition: A | Discharge status: Home / Self Care | Payer: BLUE CROSS/BLUE SHIELD | Source: Ambulatory Visit | Attending: Thoracic Surgery (Cardiothoracic Vascular Surgery) | Admitting: Thoracic Surgery (Cardiothoracic Vascular Surgery)

## 2015-09-20 VITALS — BP 132/84 | HR 60 | Resp 20 | Ht 72.0 in | Wt 196.0 lb

## 2015-09-20 DIAGNOSIS — I2510 Atherosclerotic heart disease of native coronary artery without angina pectoris: Secondary | ICD-10-CM

## 2015-09-20 DIAGNOSIS — I209 Angina pectoris, unspecified: Secondary | ICD-10-CM

## 2015-09-20 DIAGNOSIS — Z951 Presence of aortocoronary bypass graft: Secondary | ICD-10-CM

## 2015-09-20 DIAGNOSIS — J90 Pleural effusion, not elsewhere classified: Secondary | ICD-10-CM | POA: Diagnosis not present

## 2015-09-20 MED ORDER — METOPROLOL SUCCINATE ER 25 MG PO TB24: 25.0000 mg | ORAL_TABLET | Freq: Every day | ORAL | 6 refills | Status: DC

## 2015-09-20 MED ORDER — ISOSORBIDE MONONITRATE ER 30 MG PO TB24: 30.0000 mg | ORAL_TABLET | Freq: Every day | ORAL | 0 refills | Status: DC

## 2015-09-20 NOTE — Progress Notes (Signed)
[address]       [place] [address]             [phone]       HPI: [name] [name] returns today for scheduled postoperative follow-up visit  [name] [name] is a 50 year old gentleman who presented with angina. At catheterization he was found to have severe three-vessel coronary disease with preserved left ventricular function. He underwent coronary bypass grafting 5 on 08/18/2015. His postop course was uneventful.  He feels well. He is walking a mile and a half a day without any anginal pain. He still has some incisional discomfort in his chest, but stopped taking oxycodone about 2 weeks ago. He is using Tylenol for that. He is anxious to increase his physical activities and return to work. He has not smoked since surgery.  Past Medical History:  Diagnosis Date  . Acute bronchitis 03/31/2009  . ANXIETY 11/10/2008  . Arthritis   . Coronary artery disease   . EPIGASTRIC PAIN 12/08/2009  . GERD (gastroesophageal reflux disease)   . 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 Other and unspecified hyperlipidemia 12/29/2008    Current Outpatient Prescriptions  Medication Sig Dispense Refill  . acetaminophen (TYLENOL) 325 MG tablet Take 650 mg by mouth every 6 (six) hours as needed.    Marland Kitchen aspirin EC 325 MG EC tablet Take 1 tablet (325 mg total) by mouth daily.    Marland Kitchen atorvastatin (LIPITOR) 80 MG tablet Take 1 tablet (80 mg total) by mouth daily. 30 tablet 5  . clonazePAM (KLONOPIN) 1 MG tablet TAKE 1 TABLET BY MOUTH TWICE A DAY 60 tablet 5  . isosorbide mononitrate (IMDUR) 30 MG 24 hr tablet Take 1 tablet (30 mg total) by mouth daily. 30 tablet 0  . metoprolol tartrate (LOPRESSOR) 25 MG tablet Take 0.5 tablets (12.5 mg total) by mouth 2 (two) times daily. 16 tablet 1  . nitroGLYCERIN (NITROSTAT) 0.4 MG SL tablet Place 0.4 tablets under the tongue as needed for chest pain.   11  . oxyCODONE (OXY IR/ROXICODONE) 5 MG immediate release tablet Take 1-2 tablets (5-10 mg total) by  mouth every 6 (six) hours as needed for severe pain. 30 tablet 0  . pantoprazole (PROTONIX) 40 MG tablet TAKE 1 TABLET BY MOUTH EVERY DAY 90 tablet 2   No current facility-administered medications for this visit.     Physical Exam BP 132/84 (BP Location: Right Arm, Patient Position: Sitting, Cuff Size: Normal)   Pulse 60   Resp 20   Ht 6' (1.829 m)   Wt 196 lb (88.9 kg)   SpO2 98% Comment: RA  BMI 26.21 kg/m  50 year old man in no acute distress Alert and oriented 3 with no focal deficits Lungs clear with equal breath sounds bilaterally Cardiac regular rate and rhythm normal S1 and S2 Sternum stable, incision well-healed Left upper extremity incision well-healed, brisk capillary refill, mild numbness at base of left thumb Leg incisions well healed, no peripheral edema  Diagnostic Tests: CHEST  2 VIEW  COMPARISON:  08/20/2015  FINDINGS: Cardiac shadow is stable. Postsurgical changes are seen. Mild left basilar atelectasis with associated effusion is seen. Minimal linear platelike atelectasis is noted best on the frontal film. No bony abnormality is seen. The right lung remains clear.  IMPRESSION: Left lower lobe atelectasis and associated small effusion.   Electronically Signed   By: Inez Catalina M.D.   On: 09/20/2015 09:31   Impression: [name] [name] is a 50 year old gentleman  who underwent coronary bypass graft 5 about a month ago. He is doing extremely well at this point in time. His exercise tolerance is good. He has some incisional discomfort but is needing Tylenol for that. He asked about using Aleve. There is no contraindication to that.  His alkaline phosphatase was elevated on his recent lab work. He has an appointment follow-up with Dr. [name] to recheck that.  Tobacco abuse- he had quit the day before surgery. I commended him on that.  He is not to lift anything over 10 pounds for another 2 weeks. He is not to do any heavy work for another 4 weeks. After  that his activities are unrestricted.  He has been driving without difficulty.  He is on 12.5 mg metoprolol BID. I changed him to Toprol-XL 25 mg daily.  I'm going to keep him on Imdur for another 2 weeks for the radial artery graft.  Plan: He will follow-up with Dr. Jenkins Rouge.  I will be happy to see him back any time the future if I can be of any further assistance with his care.  Melrose Nakayama, MD [facility] [phone]

## 2015-09-22 ENCOUNTER — Other Ambulatory Visit: Payer: BLUE CROSS/BLUE SHIELD | Admitting: *Deleted

## 2015-09-22 DIAGNOSIS — R748 Abnormal levels of other serum enzymes: Secondary | ICD-10-CM | POA: Diagnosis not present

## 2015-09-22 LAB — HEPATIC FUNCTION PANEL
ALT: 38 U/L (ref 9–46)
AST: 32 U/L (ref 10–35)
Albumin: 4.3 g/dL (ref 3.6–5.1)
Alkaline Phosphatase: 125 U/L — ABNORMAL HIGH (ref 40–115)
Bilirubin, Direct: 0.2 mg/dL (ref <=0.2)
Indirect Bilirubin: 0.5 mg/dL (ref 0.2–1.2)
Total Bilirubin: 0.7 mg/dL (ref 0.2–1.2)
Total Protein: 7.2 g/dL (ref 6.1–8.1)

## 2015-10-10 ENCOUNTER — Other Ambulatory Visit: Payer: Self-pay | Admitting: Thoracic Surgery (Cardiothoracic Vascular Surgery)

## 2015-10-12 ENCOUNTER — Encounter (HOSPITAL_COMMUNITY): Payer: Self-pay | Admitting: *Deleted

## 2015-10-12 DIAGNOSIS — Z951 Presence of aortocoronary bypass graft: Secondary | ICD-10-CM

## 2015-10-13 ENCOUNTER — Encounter (HOSPITAL_COMMUNITY)
Admission: RE | Admit: 2015-10-13 | Discharge: 2015-10-13 | Disposition: A | Discharge status: Home / Self Care | Payer: BLUE CROSS/BLUE SHIELD | Source: Ambulatory Visit | Attending: Cardiovascular Disease | Admitting: Cardiovascular Disease

## 2015-10-13 VITALS — BP 124/80 | HR 62 | Ht 71.0 in | Wt 198.9 lb

## 2015-10-13 DIAGNOSIS — Z951 Presence of aortocoronary bypass graft: Secondary | ICD-10-CM | POA: Diagnosis not present

## 2015-10-13 NOTE — Progress Notes (Signed)
Cardiac Rehab Medication Review by a Pharmacist  Does the patient  feel that his/her medications are working for him/her?  yes  Has the patient been experiencing any side effects to the medications prescribed?  no  Does the patient measure his/her own blood pressure or blood glucose at home?  no   Does the patient have any problems obtaining medications due to transportation or finances?   no  Understanding of regimen: fair Understanding of indications: good Potential of compliance: good    Pharmacist comments: 22 YOM presenting today for cardiac rehab orientation. Regarding his medications, his Imdur was only filled for one month and he is no longer taking it. According to the patient, this was planned, and the doctor did not intend for this to be long term. He also stopped taking the oxycodone after 4-5 days and no longer needs it. He does note that his atorvastatin 80mg  tablet is large, but he is perfectly okay with splitting it in half and swallowing the halves separately. He was counseled about taking his blood pressure at home, but to at least get it checked whenever he is at a pharmacy or doctor's office.  [name] [name]), PharmD  PGY1 Pharmacy Resident Pager: [phone] 10/13/2015 8:24 AM

## 2015-10-14 ENCOUNTER — Encounter (HOSPITAL_COMMUNITY): Payer: Self-pay

## 2015-10-14 NOTE — Progress Notes (Signed)
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  acetaminophen (TYLENOL) 325 MG tablet, Take 650 mg by mouth every 6 (six) hours as needed., Disp: , Rfl:  .  aspirin EC 325 MG EC tablet, Take 1 tablet (325 mg total) by mouth daily., Disp: , Rfl:  .  atorvastatin (LIPITOR) 80 MG tablet, Take 1 tablet (80 mg total) by mouth daily., Disp: 30 tablet, Rfl: 5 .  clonazePAM (KLONOPIN) 1 MG tablet, TAKE 1 TABLET BY MOUTH TWICE A DAY, Disp: 60 tablet, Rfl: 5 .  metoprolol succinate (TOPROL XL) 25 MG 24 hr tablet, Take 1 tablet (25 mg total) by mouth daily., Disp: 30 tablet, Rfl: 6 .  nitroGLYCERIN (NITROSTAT) 0.4 MG SL tablet, Place 0.4 tablets under the tongue as needed for chest pain. , Disp: , Rfl: 11 .  pantoprazole (PROTONIX) 40 MG tablet, TAKE 1 TABLET BY MOUTH EVERY DAY, Disp: 90 tablet, Rfl: 2 .  isosorbide mononitrate (IMDUR) 30 MG 24 hr tablet, Take 1 tablet (30 mg total) by mouth daily. (Patient not taking: Reported on 10/13/2015), Disp: 14 tablet, Rfl: 0 .  oxyCODONE (OXY IR/ROXICODONE) 5 MG immediate release tablet, Take 1-2 tablets (5-10 mg total) by mouth every 6 (six) hours as needed for severe pain. (Patient not taking: Reported on 10/13/2015), Disp: 30 tablet, Rfl: 0  Past Medical History: Past Medical History:  Diagnosis Date  . Acute bronchitis 03/31/2009  . ANXIETY 11/10/2008  . Arthritis   . Coronary artery disease   .  EPIGASTRIC PAIN 12/08/2009  . GERD (gastroesophageal reflux disease)   . 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 Other and unspecified hyperlipidemia 12/29/2008    Tobacco Use: History  Smoking Status  . Former Smoker  . Packs/day: 1.00  . Years: 8.00  . Types: Cigarettes  . Quit date: 08/14/2015  Smokeless Tobacco  . Never Used    Labs: Recent Review Flowsheet Data    Labs for ITP Cardiac and Pulmonary Rehab Latest Ref Rng & Units 08/17/2015 08/17/2015 08/17/2015 08/18/2015 09/08/2015   Cholestrol 125 - 200 mg/dL - - - - 138   LDLCALC <130 mg/dL - - - - 81   LDLDIRECT mg/dL - - - - -   HDL >=40 mg/dL - - - - 38(L)   Trlycerides <150 mg/dL - - - - 97   Hemoglobin A1c 4.8 - 5.6 % - - - - -   PHART 7.350 - 7.450 7.307(L) 7.324(L) - - -   PCO2ART 35.0 - 45.0 mmHg 47.9(H) 44.9 - - -   HCO3 20.0 - 24.0 mEq/L 24.1(H) 23.6 - - -   TCO2 0 - 100 mmol/L 26 25 23 25  -   ACIDBASEDEF 0.0 - 2.0 mmol/L 3.0(H) 3.0(H) - - -   O2SAT % 98.0 96.0 - - -      Capillary Blood Glucose: Lab Results  Component Value Date   GLUCAP 102 (H) 08/21/2015   GLUCAP  128 (H) 08/20/2015   GLUCAP 115 (H) 08/20/2015   GLUCAP 133 (H) 08/20/2015   GLUCAP 120 (H) 08/20/2015     Exercise Target Goals: Date: 10/13/15  Exercise Program Goal: Individual exercise prescription set with THRR, safety & activity barriers. Participant demonstrates ability to understand and report RPE using BORG scale, to self-measure pulse accurately, and to acknowledge the importance of the exercise prescription.  Exercise Prescription Goal: Starting with aerobic activity 30 plus minutes a day, 3 days per week for initial exercise prescription. Provide home exercise prescription and guidelines that participant acknowledges understanding prior to discharge.  Activity Barriers & Risk Stratification:     Activity Barriers & Cardiac Risk Stratification - 10/13/15 0850      Activity Barriers & Cardiac Risk Stratification   Activity  Barriers None   Cardiac Risk Stratification High      6 Minute Walk:     6 Minute Walk    Row Name 10/13/15 1129 10/13/15 1134       6 Minute Walk   Phase Initial  -    Distance 2113 feet  -    Walk Time 6 minutes  -    # of Rest Breaks 0  -    MPH 4  -    METS 5.39  -    RPE 7  -    Perceived Dyspnea  0  -    VO2 Peak 18.87  -    Symptoms No  -    Resting HR 62 bpm  -    Resting BP 124/80  -    Max Ex. HR 91 bpm  -    Max Ex. BP 142/82 148/82    2 Minute Post BP 120/88  -       Initial Exercise Prescription:     Initial Exercise Prescription - 10/13/15 1200      Date of Initial Exercise RX and Referring Provider   Date 10/13/15   Referring Provider Jenkins Rouge, MD     Treadmill   MPH 3.2   Grade 2   Minutes 10   METs 4.33     Bike   Level 1.2   Minutes 10   METs 3.52     NuStep   Level 3   Minutes 10   METs 2     Intensity   THRR 40-80% of Max Heartrate 68-135   Ratings of Perceived Exertion 11-13   Perceived Dyspnea 0-4     Progression   Progression Continue to progress workloads to maintain intensity without signs/symptoms of physical distress.     Resistance Training   Training Prescription Yes   Weight 4lbs   Reps 10-12      Perform Capillary Blood Glucose checks as needed.  Exercise Prescription Changes:   Exercise Comments:   Discharge Exercise Prescription (Final Exercise Prescription Changes):   Nutrition:  Target Goals: Understanding of nutrition guidelines, daily intake of sodium 1500mg , cholesterol 200mg , calories 30% from fat and 7% or less from saturated fats, daily to have 5 or more servings of fruits and vegetables.  Biometrics:     Pre Biometrics - 10/13/15 1209      Pre Biometrics   Waist Circumference 40.25 inches   Hip Circumference 39.5 inches   Waist to Hip Ratio 1.02 %   Triceps Skinfold 17 mm   % Body Fat 27.3 %   Grip Strength 46 kg   Flexibility 13.5 in   Single Leg Stand 30 seconds  Nutrition Therapy Plan and Nutrition Goals:   Nutrition Discharge: Nutrition Scores:   Nutrition Goals Re-Evaluation:   Psychosocial: Target Goals: Acknowledge presence or absence of depression, maximize coping skills, provide positive support system. Participant is able to verbalize types and ability to use techniques and skills needed for reducing stress and depression.  Initial Review & Psychosocial Screening:     Initial Psych Review & Screening - 10/14/15 [place]? Yes   Comments No identifiable pyschosocial needs at this time, will continue to monitor      Quality of Life Scores:     Quality of Life - 10/13/15 1130      Quality of Life Scores   Health/Function Pre 27.12 %   Socioeconomic Pre 25.57 %   Psych/Spiritual Pre 30 %   Family Pre 28.5 %   GLOBAL Pre 27.6 %      PHQ-9: Recent Review Flowsheet Data    There is no flowsheet data to display.      Psychosocial Evaluation and Intervention:   Psychosocial Re-Evaluation:   Vocational Rehabilitation: Provide vocational rehab assistance to qualifying candidates.   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 Participant will state understanding/return demonstration of topics presented.  Learning Barriers/Preferences:     Learning Barriers/Preferences - 10/13/15 0850      Learning Barriers/Preferences   Learning Barriers Sight  readers   Learning Preferences Skilled Demonstration;Written Material      Education Topics: Count Your Pulse:  -Group instruction provided by verbal instruction,  demonstration, patient participation and written materials to support subject.  Instructors address importance of being able to find your pulse and how to count your pulse when at home without a heart monitor.  Patients get hands on experience counting their pulse with staff help and individually.   Heart Attack, Angina, and Risk Factor Modification:  -Group instruction provided by verbal instruction, video, and written materials to support subject.  Instructors address signs and symptoms of angina and heart attacks.    Also discuss risk factors for heart disease and how to make changes to improve heart health risk factors.   Functional Fitness:  -Group instruction provided by verbal instruction, demonstration, patient participation, and written materials to support subject.  Instructors address safety measures for doing things around the house.  Discuss how to get up and down off the floor, how to pick things up properly, how to safely get out of a chair without assistance, and balance training.   Meditation and Mindfulness:  -Group instruction provided by verbal instruction, patient participation, and written materials to support subject.  Instructor addresses importance of mindfulness and meditation practice to help reduce stress and improve awareness.  Instructor also leads participants through a meditation exercise.    Stretching for Flexibility and Mobility:  -Group instruction provided by verbal instruction, patient participation, and written materials to support subject.  Instructors lead participants through series of stretches that are designed to increase flexibility thus improving mobility.  These stretches are additional exercise for major muscle groups that are typically performed during regular warm up and  cool down.   Hands Only CPR Anytime:  -Group instruction provided by verbal instruction, video, patient participation and written materials to support subject.  Instructors  co-teach with AHA video for hands only CPR.  Participants get hands on experience with mannequins.   Nutrition I class: Heart Healthy Eating:  -Group instruction provided by PowerPoint slides, verbal discussion, and written materials to support subject matter. The instructor gives an explanation and review of the Therapeutic Lifestyle Changes diet recommendations, which includes a discussion on lipid goals, dietary fat, sodium, fiber, plant stanol/sterol esters, sugar, and the components of a well-balanced, healthy diet.   Nutrition II class: Lifestyle Skills:  -Group instruction provided by PowerPoint slides, verbal discussion, and written materials to support subject matter. The instructor gives an explanation and review of label reading, grocery shopping for heart health, heart healthy recipe modifications, and ways to make healthier choices when eating out.   Diabetes Question & Answer:  -Group instruction provided by PowerPoint slides, verbal discussion, and written materials to support subject matter. The instructor gives an explanation and review of diabetes co-morbidities, pre- and post-prandial blood glucose goals, pre-exercise blood glucose goals, signs, symptoms, and treatment of hypoglycemia and hyperglycemia, and foot care basics.   Diabetes Blitz:  -Group instruction provided by PowerPoint slides, verbal discussion, and written materials to support subject matter. The instructor gives an explanation and review of the physiology behind type 1 and type 2 diabetes, diabetes medications and rational behind using different medications, pre- and post-prandial blood glucose recommendations and Hemoglobin A1c goals, diabetes diet, and exercise including blood glucose guidelines for exercising safely.    Portion Distortion:  -Group instruction provided by PowerPoint slides, verbal discussion, written materials, and food models to support subject matter. The instructor gives an explanation of  serving size versus portion size, changes in portions sizes over the last 20 years, and what consists of a serving from each food group.   Stress Management:  -Group instruction provided by verbal instruction, video, and written materials to support subject matter.  Instructors review role of stress in heart disease and how to cope with stress positively.     Exercising on Your Own:  -Group instruction provided by verbal instruction, power point, and written materials to support subject.  Instructors discuss benefits of exercise, components of exercise, frequency and intensity of exercise, and end points for exercise.  Also discuss use of nitroglycerin and activating EMS.  Review options of places to exercise outside of rehab.  Review guidelines for sex with heart disease.   Cardiac Drugs I:  -Group instruction provided by verbal instruction and written materials to support subject.  Instructor reviews cardiac drug classes: antiplatelets, anticoagulants, beta blockers, and statins.  Instructor discusses reasons, side effects, and lifestyle considerations for each drug class.   Cardiac Drugs II:  -Group instruction provided by verbal instruction and written materials to support subject.  Instructor reviews cardiac drug classes: angiotensin converting enzyme inhibitors (ACE-I), angiotensin II receptor blockers (ARBs), nitrates, and calcium channel blockers.  Instructor discusses reasons, side effects, and lifestyle considerations for each drug class.   Anatomy and Physiology of the Circulatory System:  -Group instruction provided by verbal instruction, video, and written materials to support subject.  Reviews functional anatomy of heart, how it relates to various diagnoses, and what role the heart plays in the overall system.   Knowledge Questionnaire Score:     Knowledge Questionnaire Score - 10/13/15 1202      Knowledge Questionnaire Score   Pre Score  19/24      Core Components/Risk  Factors/Patient Goals at Admission:     Personal Goals and Risk Factors at Admission - 10/13/15 0901      Core Components/Risk Factors/Patient Goals on Admission   Personal Goal Lose 5-10lbs and be able to complete cardiac rehab and know limitations   Intervention Provide exercise guidelines to improve aerobic fitness and provide cardiac education classes to increase awareness and knowledge on CVD   Expected Outcomes Pt will complete cardiac rehab and lose 5-10lbs      Core Components/Risk Factors/Patient Goals Review:    Core Components/Risk Factors/Patient Goals at Discharge (Final Review):    ITP Comments:     ITP Comments    Row Name 10/13/15 0845           ITP Comments Dr. Fransico Him, Medical Director           Comments:  Pt in on 10/13/15 from 0800-1030 for cardiac rehab orientation.  As a part of the orientation appt, pt completed 6 minute walk test.  Pt tolerated well, monitor showed Sr with no ectopy.  Pt is already walking 2 miles a day and is looking forward to starting full exercise on next week.  No obvious psychosocial needs identified at this time. Cherre Huger, BSN

## 2015-10-17 ENCOUNTER — Encounter (HOSPITAL_COMMUNITY): Payer: Self-pay

## 2015-10-17 ENCOUNTER — Encounter (HOSPITAL_COMMUNITY)
Admission: RE | Admit: 2015-10-17 | Discharge: 2015-10-17 | Disposition: A | Discharge status: Home / Self Care | Payer: BLUE CROSS/BLUE SHIELD | Source: Ambulatory Visit | Attending: Cardiovascular Disease | Admitting: Cardiovascular Disease

## 2015-10-17 DIAGNOSIS — Z951 Presence of aortocoronary bypass graft: Secondary | ICD-10-CM

## 2015-10-17 NOTE — Progress Notes (Signed)
Daily Session Note  Patient Details  Name: [name] [name] MRN: [id number] Date of Birth: 1965/09/08 Referring Provider:   Flowsheet Row CARDIAC REHAB PHASE II ORIENTATION from 10/13/2015 in [facility]  Referring Provider  Jenkins Rouge, MD      Encounter Date: 10/17/2015  Check In:     Session Check In - 10/17/15 0815      Check-In   Location [facility]   Staff Present Dorna Bloom, MS, ACSM RCEP, Exercise Physiologist;Portia Rollene Rotunda, RN, BSN;Joann Rion, RN, Marga Melnick, RN, BSN   Supervising physician immediately available to respond to emergencies [facility] immediately available   Physician(s) Dr. Alfredia Ferguson   Fall or balance concerns reported    No   Warm-up and Cool-down Performed as group-led instruction   Resistance Training Performed Yes   VAD Patient? No     Pain Assessment   Currently in Pain? No/denies   Multiple Pain Sites No      Capillary Blood Glucose: No results found for this or any previous visit (from the past 24 hour(s)).   Goals Met:  Exercise tolerated well  Goals Unmet:  Not Applicable  Comments: Pt started cardiac rehab today.  Pt tolerated light exercise without difficulty. VSS, telemetry-sinus,  asymptomatic.  Medication list reconciled. Pt denies barriers to medicaiton compliance.  PSYCHOSOCIAL ASSESSMENT:  PHQ-0. Pt exhibits positive coping skills, hopeful outlook with supportive family. No psychosocial needs identified at this time, no psychosocial interventions necessary.    [place]  Pt goals for cardiac rehab are to increase confidence and reassurance in his health and weight loss.   Pt oriented to exercise equipment and routine.    Understanding verbalized.    Dr. Fransico Him is Medical Director for Cardiac Rehab at [facility].

## 2015-10-19 ENCOUNTER — Encounter (HOSPITAL_COMMUNITY)
Admission: RE | Admit: 2015-10-19 | Discharge: 2015-10-19 | Disposition: A | Discharge status: Home / Self Care | Payer: BLUE CROSS/BLUE SHIELD | Source: Ambulatory Visit | Attending: Cardiovascular Disease | Admitting: Cardiovascular Disease

## 2015-10-19 DIAGNOSIS — Z951 Presence of aortocoronary bypass graft: Secondary | ICD-10-CM

## 2015-10-21 ENCOUNTER — Encounter (HOSPITAL_COMMUNITY): Payer: BLUE CROSS/BLUE SHIELD

## 2015-10-24 ENCOUNTER — Encounter (HOSPITAL_COMMUNITY): Payer: BLUE CROSS/BLUE SHIELD

## 2015-10-26 ENCOUNTER — Encounter (HOSPITAL_COMMUNITY)
Admission: RE | Admit: 2015-10-26 | Discharge: 2015-10-26 | Disposition: A | Discharge status: Home / Self Care | Payer: BLUE CROSS/BLUE SHIELD | Source: Ambulatory Visit | Attending: Cardiovascular Disease | Admitting: Cardiovascular Disease

## 2015-10-26 DIAGNOSIS — Z951 Presence of aortocoronary bypass graft: Secondary | ICD-10-CM | POA: Diagnosis not present

## 2015-10-28 ENCOUNTER — Encounter (HOSPITAL_COMMUNITY)
Admission: RE | Admit: 2015-10-28 | Discharge: 2015-10-28 | Disposition: A | Discharge status: Home / Self Care | Payer: BLUE CROSS/BLUE SHIELD | Source: Ambulatory Visit | Attending: Cardiovascular Disease | Admitting: Cardiovascular Disease

## 2015-10-28 DIAGNOSIS — Z951 Presence of aortocoronary bypass graft: Secondary | ICD-10-CM

## 2015-10-28 NOTE — Progress Notes (Signed)
Reviewed home exercise program with pt.  Pt stated he is already walking on the treadmill at home 3 days/week for 1 hour.  Discussed precautions when exercising outside, THRR, checking pulse, signs and symptome, NTG use and when to call MD or 911.  Pt verbalized understanding.  Cleda Mccreedy, MS 10/28/15 11:00

## 2015-10-31 ENCOUNTER — Encounter (HOSPITAL_COMMUNITY)
Admission: RE | Admit: 2015-10-31 | Discharge: 2015-10-31 | Disposition: A | Discharge status: Home / Self Care | Payer: BLUE CROSS/BLUE SHIELD | Source: Ambulatory Visit | Attending: Cardiovascular Disease | Admitting: Cardiovascular Disease

## 2015-10-31 DIAGNOSIS — Z951 Presence of aortocoronary bypass graft: Secondary | ICD-10-CM | POA: Diagnosis not present

## 2015-11-02 ENCOUNTER — Encounter (HOSPITAL_COMMUNITY): Payer: BLUE CROSS/BLUE SHIELD

## 2015-11-04 ENCOUNTER — Encounter (HOSPITAL_COMMUNITY)
Admission: RE | Admit: 2015-11-04 | Discharge: 2015-11-04 | Disposition: A | Discharge status: Home / Self Care | Payer: BLUE CROSS/BLUE SHIELD | Source: Ambulatory Visit | Attending: Cardiovascular Disease | Admitting: Cardiovascular Disease

## 2015-11-04 DIAGNOSIS — Z951 Presence of aortocoronary bypass graft: Secondary | ICD-10-CM | POA: Diagnosis not present

## 2015-11-07 ENCOUNTER — Encounter (HOSPITAL_COMMUNITY)
Admission: RE | Admit: 2015-11-07 | Discharge: 2015-11-07 | Disposition: A | Discharge status: Home / Self Care | Payer: BLUE CROSS/BLUE SHIELD | Source: Ambulatory Visit | Attending: Cardiovascular Disease | Admitting: Cardiovascular Disease

## 2015-11-07 DIAGNOSIS — Z951 Presence of aortocoronary bypass graft: Secondary | ICD-10-CM

## 2015-11-09 ENCOUNTER — Encounter (HOSPITAL_COMMUNITY): Payer: BLUE CROSS/BLUE SHIELD

## 2015-11-09 NOTE — Progress Notes (Signed)
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  acetaminophen (TYLENOL) 325 MG tablet, Take 650 mg by mouth every 6 (six) hours as needed., Disp: , Rfl:  .  aspirin EC 325 MG EC tablet, Take 1 tablet (325 mg total) by mouth daily., Disp: , Rfl:  .  atorvastatin (LIPITOR) 80 MG tablet, Take 1 tablet (80 mg total) by mouth daily., Disp: 30 tablet, Rfl: 5 .  clonazePAM (KLONOPIN) 1 MG tablet, TAKE 1 TABLET BY MOUTH TWICE A DAY, Disp: 60 tablet, Rfl: 5 .  isosorbide mononitrate (IMDUR) 30 MG 24 hr tablet, Take 1 tablet (30 mg total) by mouth daily., Disp: 14 tablet, Rfl: 0 .  metoprolol succinate (TOPROL XL) 25 MG 24 hr tablet, Take 1 tablet (25 mg total) by mouth daily., Disp: 30 tablet, Rfl: 6 .  nitroGLYCERIN (NITROSTAT) 0.4 MG SL tablet, Place 0.4 tablets under the tongue as needed for chest pain. , Disp: , Rfl: 11 .  oxyCODONE (OXY IR/ROXICODONE) 5 MG immediate release tablet, Take 1-2 tablets (5-10 mg total) by mouth every 6 (six) hours as needed for severe pain., Disp: 30 tablet, Rfl: 0 .  pantoprazole (PROTONIX) 40 MG tablet, TAKE 1 TABLET BY MOUTH EVERY DAY, Disp: 90 tablet, Rfl: 2  Past Medical History: Past Medical History:  Diagnosis Date  . Acute bronchitis 03/31/2009  . ANXIETY 11/10/2008  . Arthritis   . Coronary artery disease   . EPIGASTRIC PAIN 12/08/2009  . GERD (gastroesophageal reflux disease)   . 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 Other and unspecified hyperlipidemia 12/29/2008    Tobacco Use: History  Smoking Status  . Former Smoker  . Packs/day: 1.00  . Years: 8.00  . Types: Cigarettes  . Quit date: 08/14/2015  Smokeless Tobacco  . Never Used    Labs: Recent Review Flowsheet Data    Labs for ITP Cardiac and Pulmonary Rehab Latest Ref Rng & Units 08/17/2015 08/17/2015 08/17/2015 08/18/2015 09/08/2015   Cholestrol 125 - 200 mg/dL - - - - 138   LDLCALC <130 mg/dL - - - - 81   LDLDIRECT mg/dL - - - - -   HDL >=40 mg/dL - - - - 38(L)   Trlycerides <150 mg/dL - - - - 97   Hemoglobin A1c 4.8 - 5.6 % - - - - -   PHART 7.350 - 7.450 7.307(L) 7.324(L) - - -   PCO2ART 35.0 - 45.0 mmHg 47.9(H) 44.9 - - -   HCO3 20.0 - 24.0 mEq/L 24.1(H) 23.6 - - -   TCO2 0 - 100 mmol/L _0 -   ACIDBASEDEF 0.0 - 2.0 mmol/L 3.0(H) 3.0(H) - - -   O2SAT % 98.0 96.0 - - -      Capillary Blood Glucose: Lab Results  Component Value Date   GLUCAP 102 (H) 08/21/2015   GLUCAP 128 (H) 08/20/2015   GLUCAP 115 (H) 08/20/2015   GLUCAP  133 (H) 08/20/2015   GLUCAP 120 (H) 08/20/2015     Exercise Target Goals:    Exercise Program Goal: Individual exercise prescription set with THRR, safety & activity barriers. Participant demonstrates ability to understand and report RPE using BORG scale, to self-measure pulse accurately, and to acknowledge the importance of the exercise prescription.  Exercise Prescription Goal: Starting with aerobic activity 30 plus minutes a day, 3 days per week for initial exercise prescription. Provide home exercise prescription and guidelines that participant acknowledges understanding prior to discharge.  Activity Barriers & Risk Stratification:     Activity Barriers & Cardiac Risk Stratification - 10/13/15 0850      Activity Barriers & Cardiac Risk Stratification   Activity Barriers None   Cardiac Risk Stratification High      6 Minute Walk:     6 Minute Walk     Row Name 10/13/15 1129 10/13/15 1134       6 Minute Walk   Phase Initial  -    Distance 2113 feet  -    Walk Time 6 minutes  -    # of Rest Breaks 0  -    MPH 4  -    METS 5.39  -    RPE 7  -    Perceived Dyspnea  0  -    VO2 Peak 18.87  -    Symptoms No  -    Resting HR 62 bpm  -    Resting BP 124/80  -    Max Ex. HR 91 bpm  -    Max Ex. BP 142/82 148/82    2 Minute Post BP 120/88  -       Initial Exercise Prescription:     Initial Exercise Prescription - 10/13/15 1200      Date of Initial Exercise RX and Referring Provider   Date 10/13/15   Referring Provider Jenkins Rouge, MD     Treadmill   MPH 3.2   Grade 2   Minutes 10   METs 4.33     Bike   Level 1.2   Minutes 10   METs 3.52     NuStep   Level 3   Minutes 10   METs 2     Intensity   THRR 40-80% of Max Heartrate 68-135   Ratings of Perceived Exertion 11-13   Perceived Dyspnea 0-4     Progression   Progression Continue to progress workloads to maintain intensity without signs/symptoms of physical distress.     Resistance Training   Training Prescription Yes   Weight 4lbs   Reps 10-12      Perform Capillary Blood Glucose checks as needed.  Exercise Prescription Changes:     Exercise Prescription Changes    Row Name 11/08/15 0800             Exercise Review   Progression Yes         Response to Exercise   Blood Pressure (Admit) 118/78       Blood Pressure (Exercise) 162/92       Blood Pressure (Exit) 102/60       Heart Rate (Admit) 84 bpm       Heart Rate (Exercise) 125 bpm       Heart Rate (Exit) 91 bpm       Rating of Perceived Exertion (Exercise) 12       Comments Reviewed HEP on 10/27       Duration Progress to  30 minutes of continuous aerobic without signs/symptoms of physical distress       Intensity THRR unchanged         Progression   Progression Continue to progress workloads to maintain intensity without signs/symptoms of physical distress.       Average METs 5.5          Resistance Training   Training Prescription Yes       Weight 5lbs       Reps 10-12         Treadmill   MPH 3.4       Grade 4       Minutes 10       METs 5.48         Bike   Level 2       Minutes 10       METs 5.2         NuStep   Level 5       Minutes 10       METs 5.9         Home Exercise Plan   Plans to continue exercise at Home  HEP reviewed on 10/28/15. See progress note       Frequency Add 3 additional days to program exercise sessions.          Exercise Comments:     Exercise Comments    Row Name 11/08/15 0840           Exercise Comments Reviewed METs and goals. Pt is tolerating exercise, will continue to monitor exercise progression          Discharge Exercise Prescription (Final Exercise Prescription Changes):     Exercise Prescription Changes - 11/08/15 0800      Exercise Review   Progression Yes     Response to Exercise   Blood Pressure (Admit) 118/78   Blood Pressure (Exercise) 162/92   Blood Pressure (Exit) 102/60   Heart Rate (Admit) 84 bpm   Heart Rate (Exercise) 125 bpm   Heart Rate (Exit) 91 bpm   Rating of Perceived Exertion (Exercise) 12   Comments Reviewed HEP on 10/27   Duration Progress to 30 minutes of continuous aerobic without signs/symptoms of physical distress   Intensity THRR unchanged     Progression   Progression Continue to progress workloads to maintain intensity without signs/symptoms of physical distress.   Average METs 5.5     Resistance Training   Training Prescription Yes   Weight 5lbs   Reps 10-12     Treadmill   MPH 3.4   Grade 4   Minutes 10   METs 5.48     Bike   Level 2   Minutes 10   METs 5.2     NuStep   Level 5   Minutes 10   METs 5.9     Home Exercise Plan   Plans to continue exercise at Home  HEP reviewed on 10/28/15. See progress note   Frequency Add 3 additional days to program exercise sessions.      Nutrition:  Target Goals: Understanding of nutrition guidelines,  daily intake of sodium 1500mg , cholesterol 200mg , calories 30% from fat and 7% or less from saturated fats, daily to have 5 or more servings of fruits and vegetables.  Biometrics:     Pre Biometrics - 10/13/15 1209      Pre Biometrics   Waist Circumference 40.25 inches   Hip Circumference 39.5 inches   Waist to Hip  Ratio 1.02 %   Triceps Skinfold 17 mm   % Body Fat 27.3 %   Grip Strength 46 kg   Flexibility 13.5 in   Single Leg Stand 30 seconds       Nutrition Therapy Plan and Nutrition Goals:     Nutrition Therapy & Goals - 10/14/15 1201      Nutrition Therapy   Diet Therapeutic Lifestyle Changes     Intervention Plan   Intervention Prescribe, educate and counsel regarding individualized specific dietary modifications aiming towards targeted core components such as weight, hypertension, lipid management, diabetes, heart failure and other comorbidities.   Expected Outcomes Short Term Goal: Understand basic principles of dietary content, such as calories, fat, sodium, cholesterol and nutrients.;Long Term Goal: Adherence to prescribed nutrition plan.      Nutrition Discharge: Nutrition Scores:     Nutrition Assessments - 10/14/15 1202      MEDFICTS Scores   Pre Score 39      Nutrition Goals Re-Evaluation:   Psychosocial: Target Goals: Acknowledge presence or absence of depression, maximize coping skills, provide positive support system. Participant is able to verbalize types and ability to use techniques and skills needed for reducing stress and depression.  Initial Review & Psychosocial Screening:     Initial Psych Review & Screening - 10/14/15 [place]? Yes   Comments No identifiable pyschosocial needs at this time, will continue to monitor      Quality of Life Scores:     Quality of Life - 10/13/15 1130      Quality of Life Scores   Health/Function Pre 27.12 %   Socioeconomic Pre 25.57 %   Psych/Spiritual  Pre 30 %   Family Pre 28.5 %   GLOBAL Pre 27.6 %      PHQ-9: Recent Review Flowsheet Data    Depression screen [facility] 2/9 10/17/2015   Decreased Interest 0   Down, Depressed, Hopeless 0   PHQ - 2 Score 0      Psychosocial Evaluation and Intervention:     Psychosocial Evaluation - 11/08/15 0909      Psychosocial Evaluation & Interventions   Interventions Encouraged to exercise with the program and follow exercise prescription   Comments no psychosocial needs identified, no inteventions necessary. pt reports he is feeling stronger. 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  pt is adding exercise to his home routine.    Continued Psychosocial Services Needed No      Psychosocial Re-Evaluation:   Vocational Rehabilitation: Provide vocational rehab assistance to qualifying candidates.   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 Participant will state understanding/return demonstration of topics presented.  Learning Barriers/Preferences:     Learning Barriers/Preferences - 10/13/15 0850      Learning Barriers/Preferences   Learning Barriers Sight  readers   Learning Preferences Skilled Demonstration;Written Material      Education Topics: Count Your Pulse:  -Group instruction provided by verbal instruction, demonstration, patient participation and written materials to support subject.  Instructors address importance of being able to find  your pulse and how to count your pulse when at home without a heart monitor.  Patients get hands on experience counting their pulse with staff help and  individually. Flowsheet Row CARDIAC REHAB PHASE II EXERCISE from 11/04/2015 in [place]  Date  11/04/15  Educator  Andi Hence, RN  Instruction Review Code  2- meets goals/outcomes      Heart Attack, Angina, and Risk Factor Modification:  -Group instruction provided by verbal instruction, video, and written materials to support subject.  Instructors address signs and symptoms of angina and heart attacks.    Also discuss risk factors for heart disease and how to make changes to improve heart health risk factors. Flowsheet Row CARDIAC REHAB PHASE II EXERCISE from 11/04/2015 in [place]  Date  10/19/15  Instruction Review Code  2- meets goals/outcomes      Functional Fitness:  -Group instruction provided by verbal instruction, demonstration, patient participation, and written materials to support subject.  Instructors address safety measures for doing things around the house.  Discuss how to get up and down off the floor, how to pick things up properly, how to safely get out of a chair without assistance, and balance training.   Meditation and Mindfulness:  -Group instruction provided by verbal instruction, patient participation, and written materials to support subject.  Instructor addresses importance of mindfulness and meditation practice to help reduce stress and improve awareness.  Instructor also leads participants through a meditation exercise.  Flowsheet Row CARDIAC REHAB PHASE II EXERCISE from 11/04/2015 in [place]  Date  10/26/15  Instruction Review Code  2- meets goals/outcomes      Stretching for Flexibility and Mobility:  -Group instruction provided by verbal instruction, patient participation, and written materials to support subject.  Instructors lead participants through series of stretches that are designed to increase flexibility thus improving mobility.  These stretches are  additional exercise for major muscle groups that are typically performed during regular warm up and cool down. Flowsheet Row CARDIAC REHAB PHASE II EXERCISE from 11/04/2015 in [place]  Date  10/28/15  Instruction Review Code  2- meets goals/outcomes      Hands Only CPR Anytime:  -Group instruction provided by verbal instruction, video, patient participation and written materials to support subject.  Instructors co-teach with AHA video for hands only CPR.  Participants get hands on experience with mannequins.   Nutrition I class: Heart Healthy Eating:  -Group instruction provided by PowerPoint slides, verbal discussion, and written materials to support subject matter. The instructor gives an explanation and review of the Therapeutic Lifestyle Changes diet recommendations, which includes a discussion on lipid goals, dietary fat, sodium, fiber, plant stanol/sterol esters, sugar, and the components of a well-balanced, healthy diet.   Nutrition II class: Lifestyle Skills:  -Group instruction provided by PowerPoint slides, verbal discussion, and written materials to support subject matter. The instructor gives an explanation and review of label reading, grocery shopping for heart health, heart healthy recipe modifications, and ways to make healthier choices when eating out.   Diabetes Question & Answer:  -Group instruction provided by PowerPoint slides, verbal discussion, and written materials to support subject matter. The instructor gives an explanation and review of diabetes co-morbidities, pre- and post-prandial blood glucose goals, pre-exercise blood glucose goals, signs, symptoms, and treatment of hypoglycemia and hyperglycemia, and foot care basics.   Diabetes Blitz:  -Group instruction provided by [employer],  verbal discussion, and written materials to support subject matter. The instructor gives an explanation and review of the physiology behind  type 1 and type 2 diabetes, diabetes medications and rational behind using different medications, pre- and post-prandial blood glucose recommendations and Hemoglobin A1c goals, diabetes diet, and exercise including blood glucose guidelines for exercising safely.    Portion Distortion:  -Group instruction provided by PowerPoint slides, verbal discussion, written materials, and food models to support subject matter. The instructor gives an explanation of serving size versus portion size, changes in portions sizes over the last 20 years, and what consists of a serving from each food group.   Stress Management:  -Group instruction provided by verbal instruction, video, and written materials to support subject matter.  Instructors review role of stress in heart disease and how to cope with stress positively.     Exercising on Your Own:  -Group instruction provided by verbal instruction, power point, and written materials to support subject.  Instructors discuss benefits of exercise, components of exercise, frequency and intensity of exercise, and end points for exercise.  Also discuss use of nitroglycerin and activating EMS.  Review options of places to exercise outside of rehab.  Review guidelines for sex with heart disease.   Cardiac Drugs I:  -Group instruction provided by verbal instruction and written materials to support subject.  Instructor reviews cardiac drug classes: antiplatelets, anticoagulants, beta blockers, and statins.  Instructor discusses reasons, side effects, and lifestyle considerations for each drug class.   Cardiac Drugs II:  -Group instruction provided by verbal instruction and written materials to support subject.  Instructor reviews cardiac drug classes: angiotensin converting enzyme inhibitors (ACE-I), angiotensin II receptor blockers (ARBs), nitrates, and calcium channel blockers.  Instructor discusses reasons, side effects, and lifestyle considerations for each drug  class.   Anatomy and Physiology of the Circulatory System:  -Group instruction provided by verbal instruction, video, and written materials to support subject.  Reviews functional anatomy of heart, how it relates to various diagnoses, and what role the heart plays in the overall system.   Knowledge Questionnaire Score:     Knowledge Questionnaire Score - 10/13/15 1202      Knowledge Questionnaire Score   Pre Score 19/24      Core Components/Risk Factors/Patient Goals at Admission:     Personal Goals and Risk Factors at Admission - 10/13/15 0901      Core Components/Risk Factors/Patient Goals on Admission   Personal Goal Lose 5-10lbs and be able to complete cardiac rehab and know limitations   Intervention Provide exercise guidelines to improve aerobic fitness and provide cardiac education classes to increase awareness and knowledge on CVD   Expected Outcomes Pt will complete cardiac rehab and lose 5-10lbs      Core Components/Risk Factors/Patient Goals Review:      Goals and Risk Factor Review    Row Name 11/08/15 0840             Core Components/Risk Factors/Patient Goals Review   Personal Goals Review Other       Review Pt is enjoys CRPII and finds it helpful/beneficial. Pt has noticed an increase in flexiblity/ROM and a decrease in chest/UE soreness       Expected Outcomes Pt will continue to show progress in CRPII and improve in flexibilty/decrease soreness in UE and chest region          Core Components/Risk Factors/Patient Goals at Discharge (Final Review):      Goals and Risk Factor Review -  11/08/15 0840      Core Components/Risk Factors/Patient Goals Review   Personal Goals Review Other   Review Pt is enjoys CRPII and finds it helpful/beneficial. Pt has noticed an increase in flexiblity/ROM and a decrease in chest/UE soreness   Expected Outcomes Pt will continue to show progress in CRPII and improve in flexibilty/decrease soreness in UE and chest region       ITP Comments:     ITP Comments    Row Name 10/13/15 0845           ITP Comments Dr. Fransico Him, Medical Director          Comments: Pt is making expected progress toward personal goals after completing 7 sessions. Recommend continued exercise and life style modification education including  stress management and relaxation techniques to decrease cardiac risk profile.

## 2015-11-11 ENCOUNTER — Encounter (HOSPITAL_COMMUNITY): Payer: BLUE CROSS/BLUE SHIELD

## 2015-11-14 ENCOUNTER — Encounter (HOSPITAL_COMMUNITY): Payer: BLUE CROSS/BLUE SHIELD

## 2015-11-16 ENCOUNTER — Encounter (HOSPITAL_COMMUNITY)
Admission: RE | Admit: 2015-11-16 | Discharge: 2015-11-16 | Disposition: A | Discharge status: Home / Self Care | Payer: BLUE CROSS/BLUE SHIELD | Source: Ambulatory Visit | Attending: Cardiovascular Disease | Admitting: Cardiovascular Disease

## 2015-11-16 DIAGNOSIS — Z951 Presence of aortocoronary bypass graft: Secondary | ICD-10-CM | POA: Diagnosis not present

## 2015-11-18 ENCOUNTER — Encounter (HOSPITAL_COMMUNITY)
Admission: RE | Admit: 2015-11-18 | Discharge: 2015-11-18 | Disposition: A | Discharge status: Home / Self Care | Payer: BLUE CROSS/BLUE SHIELD | Source: Ambulatory Visit | Attending: Cardiovascular Disease | Admitting: Cardiovascular Disease

## 2015-11-18 DIAGNOSIS — Z951 Presence of aortocoronary bypass graft: Secondary | ICD-10-CM

## 2015-11-21 ENCOUNTER — Encounter (HOSPITAL_COMMUNITY): Payer: BLUE CROSS/BLUE SHIELD

## 2015-11-23 ENCOUNTER — Encounter (HOSPITAL_COMMUNITY): Payer: BLUE CROSS/BLUE SHIELD

## 2015-11-25 ENCOUNTER — Encounter (HOSPITAL_COMMUNITY): Payer: BLUE CROSS/BLUE SHIELD

## 2015-11-28 ENCOUNTER — Encounter (HOSPITAL_COMMUNITY): Payer: BLUE CROSS/BLUE SHIELD

## 2015-11-30 ENCOUNTER — Encounter (HOSPITAL_COMMUNITY): Payer: BLUE CROSS/BLUE SHIELD

## 2015-12-02 ENCOUNTER — Encounter (HOSPITAL_COMMUNITY)
Admission: RE | Admit: 2015-12-02 | Discharge: 2015-12-02 | Disposition: A | Discharge status: Home / Self Care | Payer: BLUE CROSS/BLUE SHIELD | Source: Ambulatory Visit | Attending: Cardiovascular Disease | Admitting: Cardiovascular Disease

## 2015-12-02 DIAGNOSIS — Z951 Presence of aortocoronary bypass graft: Secondary | ICD-10-CM

## 2015-12-05 ENCOUNTER — Encounter (HOSPITAL_COMMUNITY)
Admission: RE | Admit: 2015-12-05 | Discharge: 2015-12-05 | Disposition: A | Discharge status: Home / Self Care | Payer: BLUE CROSS/BLUE SHIELD | Source: Ambulatory Visit | Attending: Cardiovascular Disease | Admitting: Cardiovascular Disease

## 2015-12-05 DIAGNOSIS — Z951 Presence of aortocoronary bypass graft: Secondary | ICD-10-CM | POA: Diagnosis not present

## 2015-12-05 NOTE — Progress Notes (Signed)
[name] [name] 50 y.o. male Nutrition Note Spoke with pt.  Nutrition Survey reviewed with pt. Pt eats ~1 meal/d. Pt states he is trying to eat breakfast "because my wife gives me a hard time about not eating breakfast." Healthy breakfast choices discussed (e.g. Peanut butter toast with a banana or banana and peanut butter). Pt is following Step 2 of the Therapeutic Lifestyle Changes diet. Pt expressed understanding of the information reviewed. Pt aware of nutrition education classes offered and is unable to attend nutrition classes. Lab Results  Component Value Date   HGBA1C 5.2 08/16/2015   Wt Readings from Last 3 Encounters:  10/13/15 198 lb 13.7 oz (90.2 kg)  09/20/15 196 lb (88.9 kg)  09/08/15 194 lb 12.8 oz (88.4 kg)   Nutrition Diagnosis ? Food-and nutrition-related knowledge deficit related to lack of exposure to information as related to diagnosis of: ? CVD Nutrition Intervention ? Benefits of adopting Therapeutic Lifestyle Changes discussed when Medficts reviewed. ? Pt to attend the Portion Distortion class ? Pt given handouts for: ? Nutrition I class ? Nutrition II class ? Continue client-centered nutrition education by RD, as part of interdisciplinary care.  Goal(s) ? Pt to make healthier breakfast choices by choosing nuts and nut butters, whole grains, and fruit more often.  Monitor and Evaluate progress toward nutrition goal with team.  Derek Mound, M.Ed, RD, LDN, CDE 12/05/2015 10:05 AM

## 2015-12-07 ENCOUNTER — Encounter (HOSPITAL_COMMUNITY): Payer: BLUE CROSS/BLUE SHIELD

## 2015-12-07 NOTE — Progress Notes (Signed)
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  acetaminophen (TYLENOL) 325 MG tablet, Take 650 mg by mouth every 6 (six) hours as needed., Disp: , Rfl:  .  aspirin EC 325 MG EC tablet, Take 1 tablet (325 mg total) by mouth daily., Disp: , Rfl:  .  atorvastatin (LIPITOR) 80 MG tablet, Take 1 tablet (80 mg total) by mouth daily., Disp: 30 tablet, Rfl: 5 .  clonazePAM (KLONOPIN) 1 MG tablet, TAKE 1 TABLET BY MOUTH TWICE A DAY, Disp: 60 tablet, Rfl: 5 .  isosorbide mononitrate (IMDUR) 30 MG 24 hr tablet, Take 1 tablet (30 mg total) by mouth daily., Disp: 14 tablet, Rfl: 0 .  metoprolol succinate (TOPROL XL) 25 MG 24 hr tablet, Take 1 tablet (25 mg total) by mouth daily., Disp: 30 tablet, Rfl: 6 .  nitroGLYCERIN (NITROSTAT) 0.4 MG SL tablet, Place 0.4 tablets under the tongue as needed for chest pain. , Disp: , Rfl: 11 .  oxyCODONE (OXY IR/ROXICODONE) 5 MG immediate release tablet, Take 1-2 tablets (5-10 mg total) by mouth every 6 (six) hours as needed for severe pain., Disp: 30 tablet, Rfl: 0 .  pantoprazole (PROTONIX) 40 MG tablet, TAKE 1 TABLET BY MOUTH EVERY DAY, Disp: 90 tablet, Rfl: 2  Past Medical History: Past Medical History:  Diagnosis Date  . Acute bronchitis 03/31/2009  . ANXIETY 11/10/2008  . Arthritis   . Coronary artery disease   . EPIGASTRIC PAIN 12/08/2009  . GERD (gastroesophageal reflux disease)   . 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 Other and unspecified hyperlipidemia 12/29/2008    Tobacco Use: History  Smoking Status  . Former Smoker  . Packs/day: 1.00  . Years: 8.00  . Types: Cigarettes  . Quit date: 08/14/2015  Smokeless Tobacco  . Never Used    Labs: Recent Review Flowsheet Data    Labs for ITP Cardiac and Pulmonary Rehab Latest Ref Rng & Units 08/17/2015 08/17/2015 08/17/2015 08/18/2015 09/08/2015   Cholestrol 125 - 200 mg/dL - - - - 138   LDLCALC <130 mg/dL - - - - 81   LDLDIRECT mg/dL - - - - -   HDL >=40 mg/dL - - - - 38(L)   Trlycerides <150 mg/dL - - - - 97   Hemoglobin A1c 4.8 - 5.6 % - - - - -   PHART 7.350 - 7.450 7.307(L) 7.324(L) - - -   PCO2ART 35.0 - 45.0 mmHg 47.9(H) 44.9 - - -   HCO3 20.0 - 24.0 mEq/L 24.1(H) 23.6 - - -   TCO2 0 - 100 mmol/L _0 -   ACIDBASEDEF 0.0 - 2.0 mmol/L 3.0(H) 3.0(H) - - -   O2SAT % 98.0 96.0 - - -      Capillary Blood Glucose: Lab Results  Component Value Date   GLUCAP 102 (H) 08/21/2015   GLUCAP 128 (H) 08/20/2015   GLUCAP 115 (H) 08/20/2015   GLUCAP  133 (H) 08/20/2015   GLUCAP 120 (H) 08/20/2015     Exercise Target Goals:    Exercise Program Goal: Individual exercise prescription set with THRR, safety & activity barriers. Participant demonstrates ability to understand and report RPE using BORG scale, to self-measure pulse accurately, and to acknowledge the importance of the exercise prescription.  Exercise Prescription Goal: Starting with aerobic activity 30 plus minutes a day, 3 days per week for initial exercise prescription. Provide home exercise prescription and guidelines that participant acknowledges understanding prior to discharge.  Activity Barriers & Risk Stratification:     Activity Barriers & Cardiac Risk Stratification - 10/13/15 0850      Activity Barriers & Cardiac Risk Stratification   Activity Barriers None   Cardiac Risk Stratification High      6 Minute Walk:     6 Minute Walk     Row Name 10/13/15 1129 10/13/15 1134       6 Minute Walk   Phase Initial  -    Distance 2113 feet  -    Walk Time 6 minutes  -    # of Rest Breaks 0  -    MPH 4  -    METS 5.39  -    RPE 7  -    Perceived Dyspnea  0  -    VO2 Peak 18.87  -    Symptoms No  -    Resting HR 62 bpm  -    Resting BP 124/80  -    Max Ex. HR 91 bpm  -    Max Ex. BP 142/82 148/82    2 Minute Post BP 120/88  -       Initial Exercise Prescription:     Initial Exercise Prescription - 10/13/15 1200      Date of Initial Exercise RX and Referring Provider   Date 10/13/15   Referring Provider Jenkins Rouge, MD     Treadmill   MPH 3.2   Grade 2   Minutes 10   METs 4.33     Bike   Level 1.2   Minutes 10   METs 3.52     NuStep   Level 3   Minutes 10   METs 2     Intensity   THRR 40-80% of Max Heartrate 68-135   Ratings of Perceived Exertion 11-13   Perceived Dyspnea 0-4     Progression   Progression Continue to progress workloads to maintain intensity without signs/symptoms of physical distress.     Resistance Training   Training Prescription Yes   Weight 4lbs   Reps 10-12      Perform Capillary Blood Glucose checks as needed.  Exercise Prescription Changes:     Exercise Prescription Changes    Row Name 11/08/15 0800 12/06/15 1000           Exercise Review   Progression Yes Yes        Response to Exercise   Blood Pressure (Admit) 118/78 120/80      Blood Pressure (Exercise) 162/92 150/90      Blood Pressure (Exit) 102/60 124/90      Heart Rate (Admit) 84 bpm 71 bpm      Heart Rate (Exercise) 125 bpm 120 bpm      Heart Rate (Exit) 91 bpm 81 bpm      Rating of Perceived Exertion (Exercise) 12 12      Symptoms  - none      Comments Reviewed  HEP on 10/27 Reviewed HEP on 10/27      Duration Progress to 30 minutes of continuous aerobic without signs/symptoms of physical distress Progress to 30 minutes of continuous aerobic without signs/symptoms of physical distress       Intensity THRR unchanged THRR unchanged        Progression   Progression Continue to progress workloads to maintain intensity without signs/symptoms of physical distress. Continue to progress workloads to maintain intensity without signs/symptoms of physical distress.      Average METs 5.5 6.3        Resistance Training   Training Prescription Yes Yes      Weight 5lbs 5lbs      Reps 10-12 10-12        Treadmill   MPH 3.4 3.5      Grade 4 5      Minutes 10 10      METs 5.48 6.09        Bike   Level 2 2.5      Minutes 10 10      METs 5.2 6.21        NuStep   Level 5 6      Minutes 10 10      METs 5.9 6.6        Home Exercise Plan   Plans to continue exercise at Home  HEP reviewed on 10/28/15. See progress note Home  HEP reviewed on 10/28/15. See progress note      Frequency Add 3 additional days to program exercise sessions. Add 3 additional days to program exercise sessions.         Exercise Comments:     Exercise Comments    Row Name 11/08/15 0840 12/06/15 0959         Exercise Comments Reviewed METs and goals. Pt is tolerating exercise, will continue to monitor exercise progression Reviewed METs and goals. Pt is tolerating exercise, will continue to monitor exercise progression         Discharge Exercise Prescription (Final Exercise Prescription Changes):     Exercise Prescription Changes - 12/06/15 1000      Exercise Review   Progression Yes     Response to Exercise   Blood Pressure (Admit) 120/80   Blood Pressure (Exercise) 150/90   Blood Pressure (Exit) 124/90   Heart Rate (Admit) 71 bpm   Heart Rate (Exercise) 120 bpm   Heart Rate (Exit) 81 bpm   Rating of Perceived Exertion (Exercise) 12   Symptoms none   Comments Reviewed HEP on 10/27   Duration Progress to 30 minutes of continuous aerobic without signs/symptoms of physical distress   Intensity THRR unchanged     Progression   Progression Continue to progress workloads to maintain intensity  without signs/symptoms of physical distress.   Average METs 6.3     Resistance Training   Training Prescription Yes   Weight 5lbs   Reps 10-12     Treadmill   MPH 3.5   Grade 5   Minutes 10   METs 6.09     Bike   Level 2.5   Minutes 10   METs 6.21     NuStep   Level 6   Minutes 10   METs 6.6     Home Exercise Plan   Plans to continue exercise at Home  HEP reviewed on 10/28/15. See progress note   Frequency Add 3 additional days to program exercise sessions.      Nutrition:  Target Goals: Understanding of nutrition guidelines, daily intake of sodium <1542m, cholesterol <2068m calories 30% from fat and 7% or less from saturated fats, daily to have 5 or more servings of fruits and vegetables.  Biometrics:     Pre Biometrics - 10/13/15 1209      Pre Biometrics   Waist Circumference 40.25 inches   Hip Circumference 39.5 inches   Waist to Hip Ratio 1.02 %   Triceps Skinfold 17 mm   % Body Fat 27.3 %   Grip Strength 46 kg   Flexibility 13.5 in   Single Leg Stand 30 seconds       Nutrition Therapy Plan and Nutrition Goals:     Nutrition Therapy & Goals - 10/14/15 1201      Nutrition Therapy   Diet Therapeutic Lifestyle Changes     Intervention Plan   Intervention Prescribe, educate and counsel regarding individualized specific dietary modifications aiming towards targeted core components such as weight, hypertension, lipid management, diabetes, heart failure and other comorbidities.   Expected Outcomes Short Term Goal: Understand basic principles of dietary content, such as calories, fat, sodium, cholesterol and nutrients.;Long Term Goal: Adherence to prescribed nutrition plan.      Nutrition Discharge: Nutrition Scores:     Nutrition Assessments - 10/14/15 1202      MEDFICTS Scores   Pre Score 39      Nutrition Goals Re-Evaluation:   Psychosocial: Target Goals: Acknowledge presence or absence of depression, maximize coping skills, provide  positive support system. Participant is able to verbalize types and ability to use techniques and skills needed for reducing stress and depression.  Initial Review & Psychosocial Screening:     Initial Psych Review & Screening - 10/14/15 09Gilt EdgeYes   Comments No identifiable pyschosocial needs at this time, will continue to monitor      Quality of Life Scores:     Quality of Life - 10/13/15 1130      Quality of Life Scores   Health/Function Pre 27.12 %   Socioeconomic Pre 25.57 %   Psych/Spiritual Pre 30 %   Family Pre 28.5 %   GLOBAL Pre 27.6 %      PHQ-9: Recent Review Flowsheet Data    [facility]   Decreased Interest 0   Down, Depressed, Hopeless 0   PHQ - 2 Score 0      Psychosocial Evaluation and Intervention:     Psychosocial Evaluation - 11/08/15 0909      Psychosocial Evaluation & Interventions   Interventions Encouraged to exercise with the program and follow exercise prescription   Comments no psychosocial needs identified, no inteventions necessary. pt reports he is feeling stronger. 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    Continued Psychosocial Services Needed No      Psychosocial Re-Evaluation:     Psychosocial Re-Evaluation    [place] 12/05/15 0801             Psychosocial Re-Evaluation   Interventions Encouraged to attend Cardiac Rehabilitation for the exercise       Comments no psychosocial needs identified, no interventions necessary.  pt is using a treadmill at home.  pt enjoys walking.  pt wife is recovering from recent surgeries so pt has more responsibilities.  pt has balanced this well.  Continued Psychosocial Services Needed No          Vocational Rehabilitation: Provide vocational rehab assistance to qualifying candidates.   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 Participant will state understanding/return demonstration of topics presented.  Learning Barriers/Preferences:     Learning Barriers/Preferences - 10/13/15 0850      Learning Barriers/Preferences   Learning Barriers Sight  readers   Learning Preferences Skilled Demonstration;Written Material      Education Topics: Count Your Pulse:  -Group instruction provided by verbal instruction, demonstration, patient participation and written materials to support subject.  Instructors address importance of being able to find your pulse and how to count your pulse when at home without a heart monitor.  Patients get hands on experience counting their pulse with staff help and individually. Flowsheet Row CARDIAC REHAB PHASE II EXERCISE from 12/05/2015 in [place]  Date  11/04/15  Educator  Andi Hence, RN  Instruction Review Code  2- meets goals/outcomes      Heart Attack, Angina, and Risk Factor Modification:  -Group instruction provided by verbal instruction, video, and written materials to support subject.  Instructors address signs and symptoms of angina and heart attacks.    Also discuss risk factors for heart disease and how to make changes to improve heart health risk factors. Flowsheet Row CARDIAC REHAB PHASE II EXERCISE from 12/05/2015 in [place]  Date  10/19/15  Instruction Review Code  2- meets goals/outcomes      Functional Fitness:  -Group instruction provided by verbal instruction, demonstration, patient participation, and written materials to support subject.  Instructors  address safety measures for doing things around the house.  Discuss how to get up and down off the floor, how to pick things up properly, how to safely get out of a chair without assistance, and balance training.   Meditation and Mindfulness:  -Group instruction provided by verbal instruction, patient participation, and written materials to support subject.  Instructor addresses importance of mindfulness and meditation practice to help reduce stress and improve awareness.  Instructor also leads participants through a meditation exercise.  Flowsheet Row CARDIAC REHAB PHASE II EXERCISE from 12/05/2015 in [place]  Date  10/26/15  Instruction Review Code  2- meets goals/outcomes      Stretching for Flexibility and Mobility:  -Group instruction provided by verbal instruction, patient participation, and written materials to support subject.  Instructors lead participants through series of stretches that are designed to increase flexibility thus improving mobility.  These stretches are additional exercise for major muscle groups that are typically performed during regular warm up and cool down. Flowsheet Row CARDIAC REHAB PHASE II EXERCISE from 12/05/2015 in [place]  Date  10/28/15  Instruction Review Code  2- meets goals/outcomes      Hands Only CPR Anytime:  -Group instruction provided by verbal instruction, video, patient participation and written materials to support subject.  Instructors co-teach with [facility]HA video for hands only CPR.  Participants get hands on experience with mannequins.   Nutrition I class:  Heart Healthy Eating:  -Group instruction provided by PowerPoint slides, verbal discussion, and written materials to support subject matter. The instructor gives an explanation and review of the Therapeutic Lifestyle Changes diet recommendations, which includes a discussion on lipid goals, dietary fat, sodium, fiber, plant  stanol/sterol esters, sugar, and the components of a well-balanced, healthy diet. Flowsheet Row CARDIAC REHAB PHASE II EXERCISE from 12/05/2015 in [place]  Date  12/05/15  Educator  RD  Instruction Review Code  Not applicable [class handouts given]      Nutrition II class: Lifestyle Skills:  -Group instruction provided by PowerPoint slides, verbal discussion, and written materials to support subject matter. The instructor gives an explanation and review of label reading, grocery shopping for heart health, heart healthy recipe modifications, and ways to make healthier choices when eating out. Flowsheet Row CARDIAC REHAB PHASE II EXERCISE from 12/05/2015 in [place]  Date  12/05/15  Educator  RD  Instruction Review Code  Not applicable [class handouts given]      Diabetes Question & Answer:  -Group instruction provided by PowerPoint slides, verbal discussion, and written materials to support subject matter. The instructor gives an explanation and review of diabetes co-morbidities, pre- and post-prandial blood glucose goals, pre-exercise blood glucose goals, signs, symptoms, and treatment of hypoglycemia and hyperglycemia, and foot care basics.   Diabetes Blitz:  -Group instruction provided by PowerPoint slides, verbal discussion, and written materials to support subject matter. The instructor gives an explanation and review of the physiology behind type 1 and type 2 diabetes, diabetes medications and rational behind using different medications, pre- and post-prandial blood glucose recommendations and Hemoglobin A1c goals, diabetes diet, and exercise including blood glucose guidelines for exercising safely.    Portion Distortion:  -Group instruction provided by PowerPoint slides, verbal discussion, written materials, and food models to support subject matter. The instructor gives an explanation of serving size versus portion  size, changes in portions sizes over the last 20 years, and what consists of a serving from each food group.   Stress Management:  -Group instruction provided by verbal instruction, video, and written materials to support subject matter.  Instructors review role of stress in heart disease and how to cope with stress positively.   Flowsheet Row CARDIAC REHAB PHASE II EXERCISE from 12/05/2015 in [place]  Date  11/16/15  Instruction Review Code  2- meets goals/outcomes      Exercising on Your Own:  -Group instruction provided by verbal instruction, power point, and written materials to support subject.  Instructors discuss benefits of exercise, components of exercise, frequency and intensity of exercise, and end points for exercise.  Also discuss use of nitroglycerin and activating EMS.  Review options of places to exercise outside of rehab.  Review guidelines for sex with heart disease. Flowsheet Row CARDIAC REHAB PHASE II EXERCISE from 12/05/2015 in [place]  Date  11/18/15  Instruction Review Code  2- meets goals/outcomes      Cardiac Drugs I:  -Group instruction provided by verbal instruction and written materials to support subject.  Instructor reviews cardiac drug classes: antiplatelets, anticoagulants, beta blockers, and statins.  Instructor discusses reasons, side effects, and lifestyle considerations for each drug class.   Cardiac Drugs II:  -Group instruction provided by verbal instruction and written materials to support subject.  Instructor reviews cardiac drug classes: angiotensin converting enzyme inhibitors (ACE-I), angiotensin II receptor blockers (ARBs), nitrates,  and calcium channel blockers.  Instructor discusses reasons, side effects, and lifestyle considerations for each drug class.   Anatomy and Physiology of the Circulatory System:  -Group instruction provided by verbal instruction, video, and written  materials to support subject.  Reviews functional anatomy of heart, how it relates to various diagnoses, and what role the heart plays in the overall system.   Knowledge Questionnaire Score:     Knowledge Questionnaire Score - 10/13/15 1202      Knowledge Questionnaire Score   Pre Score 19/24      Core Components/Risk Factors/Patient Goals at Admission:     Personal Goals and Risk Factors at Admission - 10/13/15 0901      Core Components/Risk Factors/Patient Goals on Admission   Personal Goal Lose 5-10lbs and be able to complete cardiac rehab and know limitations   Intervention Provide exercise guidelines to improve aerobic fitness and provide cardiac education classes to increase awareness and knowledge on CVD   Expected Outcomes Pt will complete cardiac rehab and lose 5-10lbs      Core Components/Risk Factors/Patient Goals Review:      Goals and Risk Factor Review    Row Name 11/08/15 0840 12/06/15 0959           Core Components/Risk Factors/Patient Goals Review   Personal Goals Review Other Other;Increase Strength and Stamina      Review Pt is enjoys CRPII and finds it helpful/beneficial. 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 Pt has increased cardiac awareness and feels as though getting stronger      Expected Outcomes Pt will continue to show progress in CRPII and improve in flexibilty/decrease soreness in UE and chest region Pt will continue to perform work-related duties without difficulty and show improvement cardiovascular/aerobic fitness levels         Core Components/Risk Factors/Patient Goals at Discharge (Final Review):      Goals and Risk Factor Review - 12/06/15 0959      Core Components/Risk Factors/Patient Goals Review   Personal Goals Review Other;Increase Strength and Stamina   Review Pt laid hardwood floors on 12/04/15 without difficulty. Pt has increased  cardiac awareness and feels as though getting stronger   Expected Outcomes Pt will continue to perform work-related duties without difficulty and show improvement cardiovascular/aerobic fitness levels      ITP Comments:     ITP Comments    Row Name 10/13/15 0845 12/02/15 0815         ITP Comments Dr. Fransico Him, Medical Director Attended Education Class: Hypertension 12/02/15. Met outcomes/goals.         Comments: Pt is making expected progress toward personal goals after completing 12 sessions. Recommend continued exercise and life style modification education including  stress management and relaxation techniques to decrease cardiac risk profile.

## 2015-12-07 NOTE — Progress Notes (Signed)
Cardiology Office Note    Date:  12/12/2015   ID:  [name] [name], DOB [birth year], MRN [id number]  PCP:  [name], MD  Cardiologist:  Dr. [name]  Chief Complaint: Hospital follow up s/p  CABG  History of Present Illness:   [name] [name] is a 50 y.o. male with hx of CAD with recent CABG x5 08/2015, hyperlipidemia and tobacco abuse who presented for hospital follow up.   The patient had as SSCP after swimming vigorously. Follow up Myoview 07/29/15 was abnormal. It showed a defect of moderate severity in the basal inferoseptal, basal inferior, mid inferior and apical inferior walls. It was interpreted as an intermediate risk study. Cath 08/11/15 showed severe diffuse three-vessel coronary disease with preserved left ventricular function. Subsequently underwent CABG x 5 08/17/15 (left internal mammary artery to LAD, saphenous vein graft to first diagonal, sequential saphenous vein graft to obtuse marginal 1 and posterior lateral 2, left radial artery to posterior descending). No post operative complications. Maintained sinus rhythm. He has mild postoperative volume overload but has responded well to diuretics.  Presents today for follow up. Walking 1 miles/day without angina or dyspnea. No orthopnea, PND or syncope BP and HR a bit high at rehab has gone back to smoking    Past Medical History:  Diagnosis Date  . Acute bronchitis 03/31/2009  . ANXIETY 11/10/2008  . Arthritis   . Coronary artery disease   . EPIGASTRIC PAIN 12/08/2009  . GERD (gastroesophageal reflux disease)   . 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 Other and unspecified hyperlipidemia 12/29/2008    Past Surgical History:  Procedure Laterality Date  . CARDIAC CATHETERIZATION N/A 08/11/2015   Procedure: Left Heart Cath and Coronary Angiography;  Surgeon: Nelva Bush, MD;  Location: [facility];  Service: Cardiovascular;  Laterality: N/A;  . COLONOSCOPY    . CORONARY ARTERY BYPASS GRAFT N/A 08/17/2015   Procedure: CORONARY ARTERY BYPASS GRAFTING (CABG) times five, LIMA to LAD, SVG to Diagonal sequentially SVG to OM-PL and Left Radial Artery to PD;  Surgeon: Melrose Nakayama, MD;  Location: [facility];  Service: Open Heart Surgery;  Laterality: N/A;  . RADIAL ARTERY HARVEST Left 08/17/2015   Procedure: RADIAL ARTERY HARVEST;  Surgeon: Melrose Nakayama, MD;  Location: [facility];  Service: Open Heart Surgery;  Laterality: Left;  . TEE WITHOUT CARDIOVERSION N/A 08/17/2015   Procedure: TRANSESOPHAGEAL ECHOCARDIOGRAM (TEE);  Surgeon: Melrose Nakayama, MD;  Location: [facility];  Service: Open Heart Surgery;  Laterality: N/A;  . WISDOM TOOTH EXTRACTION  1989    Current Medications: Prior to Admission medications   Medication Sig Start Date End Date Taking? Authorizing Provider  aspirin EC 325 MG EC tablet Take 1 tablet (325 mg total) by mouth daily. 08/21/15   Wayne E Gold, PA-C  atorvastatin (LIPITOR) 80 MG tablet Take 1 tablet (80 mg total) by mouth daily. 08/11/15   Nelva Bush, MD  clonazePAM (KLONOPIN) 1 MG tablet TAKE 1 TABLET BY MOUTH TWICE A DAY 08/09/15   [name], MD  isosorbide mononitrate (IMDUR) 30 MG 24 hr tablet Take 1 tablet (30 mg total) by mouth daily. 08/21/15   Wayne E Gold, PA-C  metoprolol tartrate (LOPRESSOR) 25 MG tablet Take 0.5 tablets (12.5 mg total) by mouth 2 (two) times daily. 08/21/15   Wayne E Gold, PA-C  oxyCODONE (OXY IR/ROXICODONE) 5 MG immediate release tablet Take 1-2 tablets (5-10 mg total) by mouth every 6 (six) hours as needed for severe  pain. 08/31/15   Gaye Pollack, MD  pantoprazole (PROTONIX) 40 MG tablet TAKE 1 TABLET BY MOUTH EVERY DAY 05/04/15   [name], MD    Allergies:   Patient has no known allergies.   Social History   Social History  . Marital status: Divorced    Spouse name: N/A  . Number of children: N/A  . Years of education: N/A   Social History Main Topics  . Smoking status: Former Smoker    Packs/day: 1.00    Years: 8.00      Types: Cigarettes    Quit date: 08/14/2015  . Smokeless tobacco: Never Used  . Alcohol use 1.8 oz/week    3 Cans of beer per week     Comment: social  . Drug use: No  . Sexual activity: Not Asked   Other Topics Concern  . None   Social History Narrative  . None     Family History:  The patient's family history includes Cancer in his mother; Diabetes in his brother; Heart disease in his paternal aunt and paternal uncle; Heart disease (age of onset: 20) in his father.   ROS:   Please see the history of present illness.    ROS All other systems reviewed and are negative.   PHYSICAL EXAM:   VS:  BP (!) 142/80   Pulse 73   Ht 6' (1.829 m)   Wt 202 lb (91.6 kg)   SpO2 98%   BMI 27.40 kg/m    GEN: Well nourished, well developed, in no acute distress  HEENT: normal  Neck: no JVD, carotid bruits, or masses Cardiac: RRR; no murmurs, rubs, or gallops,no edema Sternotomy well healed  Respiratory:  clear to auscultation bilaterally, normal work of breathing GI: soft, nontender, nondistended, + BS MS: no deformity or atrophy  Skin: healing surgical incision without erythema or edema.  Neuro:  Alert and Oriented x 3, Strength and sensation are intact Psych: euthymic mood, full affect S/P left radial harvest   Wt Readings from Last 3 Encounters:  12/12/15 202 lb (91.6 kg)  10/13/15 198 lb 13.7 oz (90.2 kg)  09/20/15 196 lb (88.9 kg)      Studies/Labs Reviewed:   EKG:  EKG is ordered today.  The ekg ordered today demonstrates sinus rhythm at rate of 61 bpm. TWI in lead I and V1.   Recent Labs: 08/18/2015: Magnesium 2.3 08/20/2015: BUN 17; Creatinine, Ser 1.15; Potassium 4.4; Sodium 135 08/21/2015: Hemoglobin 8.8; Platelets 151 09/22/2015: ALT 38   Lipid Panel    Component Value Date/Time   CHOL 138 09/08/2015 0923   TRIG 97 09/08/2015 0923   HDL 38 (L) 09/08/2015 0923   CHOLHDL 3.6 09/08/2015 0923   VLDL 19 09/08/2015 0923   LDLCALC 81 09/08/2015 0923   LDLDIRECT  157.6 12/21/2008 0831    Additional studies/ records that were reviewed today include:   Intraoperative Transesophageal Echocardiography 08/17/15 LV EF: 55% -   60%  ------------------------------------------------------------------- Study Conclusions  - Left ventricle: Systolic function was normal. The estimated   ejection fraction was in the range of 55% to 60%. - Aortic valve: No evidence of vegetation. - Mitral valve: No evidence of vegetation. - Left atrium: No evidence of thrombus in the atrial cavity or   appendage. No evidence of thrombus in the appendage. - Atrial septum: No defect or patent foramen ovale was identified.   Echo contrast study showed no right-to-left atrial level shunt,   following an increase in RA  pressure induced by provocative   maneuvers. - Tricuspid valve: No evidence of vegetation.    Cardiac Catheterization: 08/11/2015 Left Heart Cath and Coronary Angiography  Conclusion     RPDA lesion, 90 %stenosed.  Ost 1st Diag to 1st Diag lesion, 80 %stenosed.  2nd Diag lesion, 90 %stenosed.  The left ventricular ejection fraction is 55-65% by visual estimate.  The left ventricular systolic function is normal.  LV end diastolic pressure is normal.  Prox RCA to Mid RCA lesion, 70 %stenosed.  Post Atrio lesion, 70 %stenosed.  3rd RPLB lesion, 90 %stenosed.  Ost LAD to Prox LAD lesion, 50 %stenosed.  1st Mrg lesion, 80 %stenosed.  LM lesion, 40 %stenosed.  Mid LAD to Dist LAD lesion, 90 %stenosed.   1.  Severe multivessel coronary artery disease involving the proximal/mid LAD, Diagonal 1 &2,  OM1, proximal/mid RCA, rPDA, and rPL branch. 2.  Normal left ventricular contraction. 3.  Upper normal left ventricular filling pressure.  Plan: 1.  Expedited outpatient cardiac surgery consultation for CABG tomorrow or early next week. 2.  Start ASA 81 mg daily, atorvastatin 80 mg daily, and prn sublingual nitroglycerin.     ASSESSMENT &  PLAN:    1. CAD s/p CABG x 5 -No anginal pain or dyspnea. EKG with non specific changes. Has seen Roxan Hockey and done cardiac rehab. Increase Toprol to 50 mg and d/c imdur   2. HLD - 09/08/2015: Cholesterol 138; HDL 38; LDL Cholesterol 81; Triglycerides 97; VLDL 19  - LDL goal less than 70.  Lab Results  Component Value Date   LDLCALC 81 09/08/2015      3. Tobacco smoking -Discussed early graft failure and use of nicotine replacement   4. LFT;s had elevated alkaline phosphatase coming down when last checked 09/22/15 no GI symptoms f/u primary   Medication Adjustments/Labs and Tests Ordered: Current medicines are reviewed at length with the patient today.  Concerns regarding medicines are outlined above.  Medication changes, Labs and Tests ordered today are listed in the Patient Instructions below. There are no Patient Instructions on file for this visit.   Signed, Jenkins Rouge, MD  12/12/2015 8:34 AM    [facility], [place], [place]  [address] Phone: [phone]; Fax: [phone]

## 2015-12-09 ENCOUNTER — Encounter (HOSPITAL_COMMUNITY)
Admission: RE | Admit: 2015-12-09 | Discharge: 2015-12-09 | Disposition: A | Discharge status: Home / Self Care | Payer: BLUE CROSS/BLUE SHIELD | Source: Ambulatory Visit | Attending: Cardiovascular Disease | Admitting: Cardiovascular Disease

## 2015-12-09 DIAGNOSIS — Z951 Presence of aortocoronary bypass graft: Secondary | ICD-10-CM

## 2015-12-12 ENCOUNTER — Encounter: Payer: Self-pay | Admitting: Cardiovascular Disease

## 2015-12-12 ENCOUNTER — Other Ambulatory Visit (INDEPENDENT_AMBULATORY_CARE_PROVIDER_SITE_OTHER): Payer: BLUE CROSS/BLUE SHIELD

## 2015-12-12 ENCOUNTER — Encounter (HOSPITAL_COMMUNITY): Payer: BLUE CROSS/BLUE SHIELD

## 2015-12-12 ENCOUNTER — Ambulatory Visit (INDEPENDENT_AMBULATORY_CARE_PROVIDER_SITE_OTHER): Payer: BLUE CROSS/BLUE SHIELD | Admitting: Cardiovascular Disease

## 2015-12-12 VITALS — BP 142/80 | HR 73 | Ht 72.0 in | Wt 202.0 lb

## 2015-12-12 DIAGNOSIS — Z0000 Encounter for general adult medical examination without abnormal findings: Secondary | ICD-10-CM | POA: Diagnosis not present

## 2015-12-12 DIAGNOSIS — I25810 Atherosclerosis of coronary artery bypass graft(s) without angina pectoris: Secondary | ICD-10-CM

## 2015-12-12 LAB — CBC WITH DIFFERENTIAL/PLATELET
Basophils Absolute: 0 10*3/uL (ref 0.0–0.1)
Basophils Relative: 0.7 % (ref 0.0–3.0)
Eosinophils Absolute: 0.2 10*3/uL (ref 0.0–0.7)
Eosinophils Relative: 3.5 % (ref 0.0–5.0)
HCT: 44.7 % (ref 39.0–52.0)
Hemoglobin: 15.1 g/dL (ref 13.0–17.0)
Lymphocytes Relative: 29.8 % (ref 12.0–46.0)
Lymphs Abs: 1.8 10*3/uL (ref 0.7–4.0)
MCHC: 33.9 g/dL (ref 30.0–36.0)
MCV: 85.4 fl (ref 78.0–100.0)
Monocytes Absolute: 0.3 10*3/uL (ref 0.1–1.0)
Monocytes Relative: 4.7 % (ref 3.0–12.0)
Neutro Abs: 3.8 10*3/uL (ref 1.4–7.7)
Neutrophils Relative %: 61.3 % (ref 43.0–77.0)
Platelets: 236 10*3/uL (ref 150.0–400.0)
RBC: 5.24 Mil/uL (ref 4.22–5.81)
RDW: 15.7 % — ABNORMAL HIGH (ref 11.5–15.5)
WBC: 6.1 10*3/uL (ref 4.0–10.5)

## 2015-12-12 LAB — BASIC METABOLIC PANEL
BUN: 12 mg/dL (ref 6–23)
CO2: 30 mEq/L (ref 19–32)
Calcium: 9.6 mg/dL (ref 8.4–10.5)
Chloride: 103 mEq/L (ref 96–112)
Creatinine, Ser: 0.97 mg/dL (ref 0.40–1.50)
GFR: 86.53 mL/min (ref >60.00)
Glucose, Bld: 92 mg/dL (ref 70–99)
Potassium: 4.8 mEq/L (ref 3.5–5.1)
Sodium: 139 mEq/L (ref 135–145)

## 2015-12-12 LAB — HEPATIC FUNCTION PANEL
ALT: 22 U/L (ref 0–53)
AST: 23 U/L (ref 0–37)
Albumin: 4.4 g/dL (ref 3.5–5.2)
Alkaline Phosphatase: 70 U/L (ref 39–117)
Bilirubin, Direct: 0.1 mg/dL (ref 0.0–0.3)
Total Bilirubin: 0.3 mg/dL (ref 0.2–1.2)
Total Protein: 7.2 g/dL (ref 6.0–8.3)

## 2015-12-12 LAB — TSH: TSH: 1.07 u[IU]/mL (ref 0.35–4.50)

## 2015-12-12 LAB — LIPID PANEL
Cholesterol: 172 mg/dL (ref 0–200)
HDL: 53.1 mg/dL (ref >39.00)
LDL Cholesterol: 94 mg/dL (ref 0–99)
NonHDL: 118.51
Total CHOL/HDL Ratio: 3
Triglycerides: 122 mg/dL (ref 0.0–149.0)
VLDL: 24.4 mg/dL (ref 0.0–40.0)

## 2015-12-12 LAB — PSA: PSA: 0.58 ng/mL (ref 0.10–4.00)

## 2015-12-12 MED ORDER — METOPROLOL SUCCINATE ER 50 MG PO TB24: 50.0000 mg | ORAL_TABLET | Freq: Every day | ORAL | 3 refills | Status: DC

## 2015-12-12 NOTE — Patient Instructions (Addendum)
Medication Instructions:  Your physician has recommended you make the following change in your medication:  1-STOP Imdur 2- INCREASE Metoprolol to 50 mg by mouth daily  Labwork: NONE  Testing/Procedures: NONE  Follow-Up: Your physician wants you to follow-up in: 6 months with Dr. [name]. You will receive a reminder letter in the mail two months in advance. If you don't receive a letter, please call our office to schedule the follow-up appointment.   If you need a refill on your cardiac medications before your next appointment, please call your pharmacy.

## 2015-12-13 ENCOUNTER — Other Ambulatory Visit: Payer: BLUE CROSS/BLUE SHIELD

## 2015-12-14 ENCOUNTER — Encounter (HOSPITAL_COMMUNITY): Payer: BLUE CROSS/BLUE SHIELD

## 2015-12-16 ENCOUNTER — Encounter (HOSPITAL_COMMUNITY): Payer: BLUE CROSS/BLUE SHIELD

## 2015-12-19 ENCOUNTER — Encounter (HOSPITAL_COMMUNITY): Payer: BLUE CROSS/BLUE SHIELD

## 2015-12-20 ENCOUNTER — Encounter: Payer: Self-pay | Admitting: Family Medicine

## 2015-12-20 ENCOUNTER — Ambulatory Visit (INDEPENDENT_AMBULATORY_CARE_PROVIDER_SITE_OTHER): Payer: BLUE CROSS/BLUE SHIELD | Admitting: Family Medicine

## 2015-12-20 VITALS — BP 120/86 | HR 79 | Temp 98.2°F | Ht 72.0 in | Wt 204.0 lb

## 2015-12-20 DIAGNOSIS — Z23 Encounter for immunization: Secondary | ICD-10-CM

## 2015-12-20 DIAGNOSIS — Z0000 Encounter for general adult medical examination without abnormal findings: Secondary | ICD-10-CM | POA: Diagnosis not present

## 2015-12-20 NOTE — Progress Notes (Signed)
Pre visit review using our clinic review tool, if applicable. No additional management support is needed unless otherwise documented below in the visit note. 

## 2015-12-20 NOTE — Progress Notes (Signed)
Subjective:     Patient ID: [name] [name], male   DOB: Jan 16, 1965, 50 y.o.   MRN: [id number]  HPI Patient seen for physical. He had bypass few months ago and has done extremely well since then. Unfortunately, he has resumed smoking about half pack cigarettes per day. He does plan to stop. He's had no chest pain. He is progressing through cardiac rehabilitation and doing very well. No recurrent chest pains. No dizziness. Current medications include metoprolol, aspirin, atorvastatin, clonazepam, and Protonix. No flu vaccination yet. Colonoscopy up-to-date  Past Medical History:  Diagnosis Date  . Acute bronchitis 03/31/2009  . ANXIETY 11/10/2008  . Arthritis   . Coronary artery disease   . EPIGASTRIC PAIN 12/08/2009  . GERD (gastroesophageal reflux disease)   . 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 Other and unspecified hyperlipidemia 12/29/2008   Past Surgical History:  Procedure Laterality Date  . CARDIAC CATHETERIZATION N/A 08/11/2015   Procedure: Left Heart Cath and Coronary Angiography;  Surgeon: Nelva Bush, MD;  Location: [facility];  Service: Cardiovascular;  Laterality: N/A;  . COLONOSCOPY    . CORONARY ARTERY BYPASS GRAFT N/A 08/17/2015   Procedure: CORONARY ARTERY BYPASS GRAFTING (CABG) times five, LIMA to LAD, SVG to Diagonal sequentially SVG to OM-PL and Left Radial Artery to PD;  Surgeon: Melrose Nakayama, MD;  Location: [facility];  Service: Open Heart Surgery;  Laterality: N/A;  . RADIAL ARTERY HARVEST Left 08/17/2015   Procedure: RADIAL ARTERY HARVEST;  Surgeon: Melrose Nakayama, MD;  Location: [facility];  Service: Open Heart Surgery;  Laterality: Left;  . TEE WITHOUT CARDIOVERSION N/A 08/17/2015   Procedure: TRANSESOPHAGEAL ECHOCARDIOGRAM (TEE);  Surgeon: Melrose Nakayama, MD;  Location: [facility];  Service: Open Heart Surgery;  Laterality: N/A;  . [place]    reports that he quit smoking about 4 months ago. His smoking use included  Cigarettes. He has a 8.00 pack-year smoking history. He has never used smokeless tobacco. He reports that he drinks about 1.8 oz of alcohol per week . He reports that he does not use drugs. family history includes Cancer in his mother; Diabetes in his brother; Heart disease in his paternal aunt and paternal uncle; Heart disease (age of onset: 56) in his father. No Known Allergies   Review of Systems  Constitutional: Negative for activity change, appetite change, fatigue and fever.  HENT: Negative for congestion, ear pain and trouble swallowing.   Eyes: Negative for pain and visual disturbance.  Respiratory: Negative for cough, shortness of breath and wheezing.   Cardiovascular: Negative for chest pain and palpitations.  Gastrointestinal: Negative for abdominal distention, abdominal pain, blood in stool, constipation, diarrhea, nausea, rectal pain and vomiting.  Genitourinary: Negative for dysuria, hematuria and testicular pain.  Musculoskeletal: Negative for arthralgias and joint swelling.  Skin: Negative for rash.  Neurological: Negative for dizziness, syncope and headaches.  Hematological: Negative for adenopathy.  Psychiatric/Behavioral: Negative for confusion and dysphoric mood.       Objective:   Physical Exam  Constitutional: He is oriented to person, place, and time. He appears well-developed and well-nourished. No distress.  HENT:  Head: Normocephalic and atraumatic.  Right Ear: External ear normal.  Left Ear: External ear normal.  Mouth/Throat: Oropharynx is clear and moist.  Eyes: Conjunctivae and EOM are normal. Pupils are equal, round, and reactive to light.  Neck: Normal range of motion. Neck supple. No thyromegaly present.  Cardiovascular: Normal rate, regular rhythm and normal heart sounds.  No murmur heard. Pulmonary/Chest: No respiratory distress. He has no wheezes. He has no rales.  Abdominal: Soft. Bowel sounds are normal. He exhibits no distension and no mass.  There is no tenderness. There is no rebound and no guarding.  Musculoskeletal: He exhibits no edema.  Lymphadenopathy:    He has no cervical adenopathy.  Neurological: He is alert and oriented to person, place, and time. He displays normal reflexes. No cranial nerve deficit.  Skin: No rash noted.  Psychiatric: He has a normal mood and affect.       Assessment:     #1 physical exam. Ongoing nicotine use and recent bypass as above. Recovering well from recent bypass.  Labs reviewed with no major concerns    Plan:     -Strongly advise flu vaccination and patient consents -Information on smoking cessation given. We have offered any help we can give regarding this. Patient knows importance of quitting -Continue current medications -Continue regular exercise habits.  [name] MD [facility]

## 2015-12-20 NOTE — Patient Instructions (Signed)
Steps to Quit Smoking Smoking tobacco can be harmful to your health and can affect almost every organ in your body. Smoking puts you, and those around you, at risk for developing many serious chronic diseases. Quitting smoking is difficult, but it is one of the best things that you can do for your health. It is never too late to quit. What are the benefits of quitting smoking? When you quit smoking, you lower your risk of developing serious diseases and conditions, such as:  Lung cancer or lung disease, such as COPD.  Heart disease.  Stroke.  Heart attack.  Infertility.  Osteoporosis and bone fractures.  Additionally, symptoms such as coughing, wheezing, and shortness of breath may get better when you quit. You may also find that you get sick less often because your body is stronger at fighting off colds and infections. If you are pregnant, quitting smoking can help to reduce your chances of having a baby of low birth weight. How do I get ready to quit? When you decide to quit smoking, create a plan to make sure that you are successful. Before you quit:  Pick a date to quit. Set a date within the next two weeks to give you time to prepare.  Write down the reasons why you are quitting. Keep this list in places where you will see it often, such as on your bathroom mirror or in your car or wallet.  Identify the people, places, things, and activities that make you want to smoke (triggers) and avoid them. Make sure to take these actions: ? Throw away all cigarettes at home, at work, and in your car. ? Throw away smoking accessories, such as ashtrays and lighters. ? Clean your car and make sure to empty the ashtray. ? Clean your home, including curtains and carpets.  Tell your family, friends, and coworkers that you are quitting. Support from your loved ones can make quitting easier.  Talk with your health care provider about your options for quitting smoking.  Find out what treatment  options are covered by your health insurance.  What strategies can I use to quit smoking? Talk with your healthcare provider about different strategies to quit smoking. Some strategies include:  Quitting smoking altogether instead of gradually lessening how much you smoke over a period of time. Research shows that quitting "cold turkey" is more successful than gradually quitting.  Attending in-person counseling to help you build problem-solving skills. You are more likely to have success in quitting if you attend several counseling sessions. Even short sessions of 10 minutes can be effective.  Finding resources and support systems that can help you to quit smoking and remain smoke-free after you quit. These resources are most helpful when you use them often. They can include: ? Online chats with a counselor. ? Telephone quitlines. ? Printed self-help materials. ? Support groups or group counseling. ? Text messaging programs. ? Mobile phone applications.  Taking medicines to help you quit smoking. (If you are pregnant or breastfeeding, talk with your health care provider first.) Some medicines contain nicotine and some do not. Both types of medicines help with cravings, but the medicines that include nicotine help to relieve withdrawal symptoms. Your health care provider may recommend: ? Nicotine patches, gum, or lozenges. ? Nicotine inhalers or sprays. ? Non-nicotine medicine that is taken by mouth.  Talk with your health care provider about combining strategies, such as taking medicines while you are also receiving in-person counseling. Using these two strategies together   makes you more likely to succeed in quitting than if you used either strategy on its own. If you are pregnant or breastfeeding, talk with your health care provider about finding counseling or other support strategies to quit smoking. Do not take medicine to help you quit smoking unless told to do so by your health care  provider. What things can I do to make it easier to quit? Quitting smoking might feel overwhelming at first, but there is a lot that you can do to make it easier. Take these important actions:  Reach out to your family and friends and ask that they support and encourage you during this time. Call telephone quitlines, reach out to support groups, or work with a counselor for support.  Ask people who smoke to avoid smoking around you.  Avoid places that trigger you to smoke, such as bars, parties, or smoke-break areas at work.  Spend time around people who do not smoke.  Lessen stress in your life, because stress can be a smoking trigger for some people. To lessen stress, try: ? Exercising regularly. ? Deep-breathing exercises. ? Yoga. ? Meditating. ? Performing a body scan. This involves closing your eyes, scanning your body from head to toe, and noticing which parts of your body are particularly tense. Purposefully relax the muscles in those areas.  Download or purchase mobile phone or tablet apps (applications) that can help you stick to your quit plan by providing reminders, tips, and encouragement. There are many free apps, such as QuitGuide from the CDC (Centers for Disease Control and Prevention). You can find other support for quitting smoking (smoking cessation) through smokefree.gov and other websites.  How will I feel when I quit smoking? Within the first 24 hours of quitting smoking, you may start to feel some withdrawal symptoms. These symptoms are usually most noticeable 2-3 days after quitting, but they usually do not last beyond 2-3 weeks. Changes or symptoms that you might experience include:  Mood swings.  Restlessness, anxiety, or irritation.  Difficulty concentrating.  Dizziness.  Strong cravings for sugary foods in addition to nicotine.  Mild weight gain.  Constipation.  Nausea.  Coughing or a sore throat.  Changes in how your medicines work in your  body.  A depressed mood.  Difficulty sleeping (insomnia).  After the first 2-3 weeks of quitting, you may start to notice more positive results, such as:  Improved sense of smell and taste.  Decreased coughing and sore throat.  Slower heart rate.  Lower blood pressure.  Clearer skin.  The ability to breathe more easily.  Fewer sick days.  Quitting smoking is very challenging for most people. Do not get discouraged if you are not successful the first time. Some people need to make many attempts to quit before they achieve long-term success. Do your best to stick to your quit plan, and talk with your health care provider if you have any questions or concerns. This information is not intended to replace advice given to you by your health care provider. Make sure you discuss any questions you have with your health care provider. Document Released: 12/12/2000 Document Revised: 08/16/2015 Document Reviewed: 05/04/2014 Elsevier Interactive Patient Education  2017 [employer]  

## 2015-12-21 ENCOUNTER — Encounter (HOSPITAL_COMMUNITY): Payer: BLUE CROSS/BLUE SHIELD

## 2015-12-23 ENCOUNTER — Encounter (HOSPITAL_COMMUNITY): Payer: BLUE CROSS/BLUE SHIELD

## 2015-12-26 ENCOUNTER — Encounter (HOSPITAL_COMMUNITY): Payer: BLUE CROSS/BLUE SHIELD

## 2015-12-28 ENCOUNTER — Encounter (HOSPITAL_COMMUNITY): Payer: BLUE CROSS/BLUE SHIELD

## 2015-12-30 ENCOUNTER — Encounter (HOSPITAL_COMMUNITY): Payer: BLUE CROSS/BLUE SHIELD

## 2016-01-02 ENCOUNTER — Encounter (HOSPITAL_COMMUNITY): Payer: BLUE CROSS/BLUE SHIELD

## 2016-01-04 ENCOUNTER — Encounter (HOSPITAL_COMMUNITY): Payer: BLUE CROSS/BLUE SHIELD

## 2016-01-06 ENCOUNTER — Encounter (HOSPITAL_COMMUNITY): Payer: BLUE CROSS/BLUE SHIELD

## 2016-01-09 ENCOUNTER — Encounter (HOSPITAL_COMMUNITY): Payer: Self-pay

## 2016-01-09 ENCOUNTER — Encounter (HOSPITAL_COMMUNITY): Payer: BLUE CROSS/BLUE SHIELD

## 2016-01-09 NOTE — Progress Notes (Signed)
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  acetaminophen (TYLENOL) 325 MG tablet, Take 650 mg by mouth every 6 (six) hours as needed., Disp: , Rfl:  .  aspirin EC 325 MG EC tablet, Take 1 tablet (325 mg total) by mouth daily., Disp: , Rfl:  .  atorvastatin (LIPITOR) 80 MG tablet, Take 1 tablet (80 mg total) by mouth daily., Disp: 30 tablet, Rfl: 5 .  clonazePAM (KLONOPIN) 1 MG tablet, TAKE 1 TABLET BY MOUTH TWICE A DAY, Disp: 60 tablet, Rfl: 5 .  metoprolol succinate (TOPROL-XL) 50 MG 24 hr tablet, Take 1 tablet (50 mg total) by mouth daily., Disp: 90 tablet, Rfl: 3 .  nitroGLYCERIN (NITROSTAT) 0.4 MG SL tablet, Place 0.4 tablets under the tongue as needed for chest pain. , Disp: , Rfl: 11 .  pantoprazole (PROTONIX) 40 MG tablet, TAKE 1 TABLET BY MOUTH EVERY DAY, Disp: 90 tablet, Rfl: 2  Past Medical History: Past Medical History:  Diagnosis Date  . Acute bronchitis 03/31/2009  . ANXIETY 11/10/2008  . Arthritis   . Coronary artery disease   . EPIGASTRIC PAIN 12/08/2009  . GERD (gastroesophageal reflux disease)   . 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 Other and unspecified hyperlipidemia 12/29/2008    Tobacco Use: History  Smoking Status  . Former Smoker  . Packs/day: 1.00  . Years: 8.00  . Types: Cigarettes  . Quit date: 08/14/2015  Smokeless Tobacco  . Never Used    Labs: Recent Review Flowsheet Data     Labs for ITP Cardiac and Pulmonary Rehab Latest Ref Rng & Units 08/17/2015 08/17/2015 08/18/2015 09/08/2015 12/12/2015   Cholestrol 0 - 200 mg/dL - - - 138 172   LDLCALC 0 - 99 mg/dL - - - 81 94   LDLDIRECT mg/dL - - - - -   HDL >39.00 mg/dL - - - 38(L) 53.10   Trlycerides 0.0 - 149.0 mg/dL - - - 97 122.0   Hemoglobin A1c 4.8 - 5.6 % - - - - -   PHART 7.350 - 7.450 7.324(L) - - - -   PCO2ART 35.0 - 45.0 mmHg 44.9 - - - -   HCO3 20.0 - 24.0 mEq/L 23.6 - - - -   TCO2 0 - 100 mmol/L 25 23 25 - -   ACIDBASEDEF 0.0 - 2.0 mmol/L 3.0(H) - - - -   O2SAT % 96.0 - - - -      Capillary Blood Glucose: Lab Results  Component Value Date   GLUCAP 102 (H) 08/21/2015   GLUCAP 128 (H) 08/20/2015   GLUCAP 115 (H) 08/20/2015   GLUCAP 133 (H) 08/20/2015   GLUCAP 120 (H) 08/20/2015     Exercise Target Goals:    Exercise Program Goal: Individual exercise prescription set with THRR, safety & activity barriers. Participant demonstrates ability to understand and report RPE using BORG scale, to self-measure pulse accurately, and to acknowledge the importance of   the exercise prescription.  Exercise Prescription Goal: Starting with aerobic activity 30 plus minutes a day, 3 days per week for initial exercise prescription. Provide home exercise prescription and guidelines that participant acknowledges understanding prior to discharge.  Activity Barriers & Risk Stratification:     Activity Barriers & Cardiac Risk Stratification - 10/13/15 0850      Activity Barriers & Cardiac Risk Stratification   Activity Barriers None   Cardiac Risk Stratification High      6 Minute Walk:     6 Minute Walk    Row Name 10/13/15 1129 10/13/15 1134       6 Minute Walk   Phase Initial  -    Distance 2113 feet  -    Walk Time 6 minutes  -    # of Rest Breaks 0  -    MPH 4  -    METS 5.39  -    RPE 7  -    Perceived Dyspnea  0  -    VO2 Peak 18.87  -    Symptoms No  -    Resting HR 62 bpm  -    Resting BP  124/80  -    Max Ex. HR 91 bpm  -    Max Ex. BP 142/82 148/82    2 Minute Post BP 120/88  -       Initial Exercise Prescription:     Initial Exercise Prescription - 10/13/15 1200      Date of Initial Exercise RX and Referring Provider   Date 10/13/15   Referring Provider Nishan, Peter, MD     Treadmill   MPH 3.2   Grade 2   Minutes 10   METs 4.33     Bike   Level 1.2   Minutes 10   METs 3.52     NuStep   Level 3   Minutes 10   METs 2     Intensity   THRR 40-80% of Max Heartrate 68-135   Ratings of Perceived Exertion 11-13   Perceived Dyspnea 0-4     Progression   Progression Continue to progress workloads to maintain intensity without signs/symptoms of physical distress.     Resistance Training   Training Prescription Yes   Weight 4lbs   Reps 10-12      Perform Capillary Blood Glucose checks as needed.  Exercise Prescription Changes:     Exercise Prescription Changes    Row Name 11/08/15 0800 12/06/15 1000           Exercise Review   Progression Yes Yes        Response to Exercise   Blood Pressure (Admit) 118/78 120/80      Blood Pressure (Exercise) 162/92 150/90      Blood Pressure (Exit) 102/60 124/90      Heart Rate (Admit) 84 bpm 71 bpm      Heart Rate (Exercise) 125 bpm 120 bpm      Heart Rate (Exit) 91 bpm 81 bpm      Rating of Perceived Exertion (Exercise) 12 12      Symptoms  - none      Comments Reviewed HEP on 10/27 Reviewed HEP on 10/27      Duration Progress to 30 minutes of continuous aerobic without signs/symptoms of physical distress Progress to 30 minutes of continuous aerobic without signs/symptoms of physical distress      Intensity THRR unchanged THRR unchanged          Progression   Progression Continue to progress workloads to maintain intensity without signs/symptoms of physical distress. Continue to progress workloads to maintain intensity without signs/symptoms of physical distress.      Average METs 5.5 6.3         Resistance Training   Training Prescription Yes Yes      Weight 5lbs 5lbs      Reps 10-12 10-12        Treadmill   MPH 3.4 3.5      Grade 4 5      Minutes 10 10      METs 5.48 6.09        Bike   Level 2 2.5      Minutes 10 10      METs 5.2 6.21        NuStep   Level 5 6      Minutes 10 10      METs 5.9 6.6        Home Exercise Plan   Plans to continue exercise at Home  HEP reviewed on 10/28/15. See progress note Home  HEP reviewed on 10/28/15. See progress note      Frequency Add 3 additional days to program exercise sessions. Add 3 additional days to program exercise sessions.         Exercise Comments:     Exercise Comments    Row Name 11/08/15 0840 12/06/15 0959         Exercise Comments Reviewed METs and goals. Pt is tolerating exercise, will continue to monitor exercise progression Reviewed METs and goals. Pt is tolerating exercise, will continue to monitor exercise progression         Discharge Exercise Prescription (Final Exercise Prescription Changes):     Exercise Prescription Changes - 12/06/15 1000      Exercise Review   Progression Yes     Response to Exercise   Blood Pressure (Admit) 120/80   Blood Pressure (Exercise) 150/90   Blood Pressure (Exit) 124/90   Heart Rate (Admit) 71 bpm   Heart Rate (Exercise) 120 bpm   Heart Rate (Exit) 81 bpm   Rating of Perceived Exertion (Exercise) 12   Symptoms none   Comments Reviewed HEP on 10/27   Duration Progress to 30 minutes of continuous aerobic without signs/symptoms of physical distress   Intensity THRR unchanged     Progression   Progression Continue to progress workloads to maintain intensity without signs/symptoms of physical distress.   Average METs 6.3     Resistance Training   Training Prescription Yes   Weight 5lbs   Reps 10-12     Treadmill   MPH 3.5   Grade 5   Minutes 10   METs 6.09     Bike   Level 2.5   Minutes 10   METs 6.21     NuStep   Level 6   Minutes 10    METs 6.6     Home Exercise Plan   Plans to continue exercise at Home  HEP reviewed on 10/28/15. See progress note   Frequency Add 3 additional days to program exercise sessions.      Nutrition:  Target Goals: Understanding of nutrition guidelines, daily intake of sodium <1500mg, cholesterol <200mg, calories 30% from fat and 7% or less from saturated fats, daily to have 5 or more servings of fruits and vegetables.  Biometrics:     Pre Biometrics - 10/13/15 1209      Pre Biometrics     Waist Circumference 40.25 inches   Hip Circumference 39.5 inches   Waist to Hip Ratio 1.02 %   Triceps Skinfold 17 mm   % Body Fat 27.3 %   Grip Strength 46 kg   Flexibility 13.5 in   Single Leg Stand 30 seconds       Nutrition Therapy Plan and Nutrition Goals:     Nutrition Therapy & Goals - 10/14/15 1201      Nutrition Therapy   Diet Therapeutic Lifestyle Changes     Intervention Plan   Intervention Prescribe, educate and counsel regarding individualized specific dietary modifications aiming towards targeted core components such as weight, hypertension, lipid management, diabetes, heart failure and other comorbidities.   Expected Outcomes Short Term Goal: Understand basic principles of dietary content, such as calories, fat, sodium, cholesterol and nutrients.;Long Term Goal: Adherence to prescribed nutrition plan.      Nutrition Discharge: Nutrition Scores:     Nutrition Assessments - 10/14/15 1202      MEDFICTS Scores   Pre Score 39      Nutrition Goals Re-Evaluation:   Psychosocial: Target Goals: Acknowledge presence or absence of depression, maximize coping skills, provide positive support system. Participant is able to verbalize types and ability to use techniques and skills needed for reducing stress and depression.  Initial Review & Psychosocial Screening:     Initial Psych Review & Screening - 10/14/15 0937      Family Dynamics   Good Support System? Yes    Comments No identifiable pyschosocial needs at this time, will continue to monitor      Quality of Life Scores:     Quality of Life - 10/13/15 1130      Quality of Life Scores   Health/Function Pre 27.12 %   Socioeconomic Pre 25.57 %   Psych/Spiritual Pre 30 %   Family Pre 28.5 %   GLOBAL Pre 27.6 %      PHQ-9: Recent Review Flowsheet Data    Depression screen PHQ 2/9 10/17/2015   Decreased Interest 0   Down, Depressed, Hopeless 0   PHQ - 2 Score 0      Psychosocial Evaluation and Intervention:     Psychosocial Evaluation - 11/08/15 0909      Psychosocial Evaluation & Interventions   Interventions Encouraged to exercise with the program and follow exercise prescription   Comments no psychosocial needs identified, no inteventions necessary. pt reports he is feeling stronger. 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  pt is adding exercise to his home routine.    Continued Psychosocial Services Needed No      Psychosocial Re-Evaluation:     Psychosocial Re-Evaluation    Row Name 12/05/15 0801             Psychosocial Re-Evaluation   Interventions Encouraged to attend Cardiac Rehabilitation for the exercise       Comments no psychosocial needs identified, no interventions necessary.  pt is using a treadmill at home.  pt enjoys walking.  pt wife is recovering from recent surgeries so pt has more responsibilities.  pt has balanced this well.        Continued Psychosocial Services Needed No          Vocational Rehabilitation: Provide vocational rehab assistance to qualifying candidates.   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 Participant will state understanding/return demonstration of topics presented.  Learning Barriers/Preferences:     Learning Barriers/Preferences - 10/13/15 0850      Learning Barriers/Preferences   Learning Barriers Sight  readers   Learning Preferences Skilled Demonstration;Written Material      Education Topics: Count Your Pulse:  -Group instruction provided by verbal instruction, demonstration, patient participation and written materials to support subject.  Instructors address importance of being able to find your pulse and how to count your pulse when at home without a heart monitor.  Patients get hands on experience counting their pulse with staff help and individually. Flowsheet Row CARDIAC REHAB PHASE II EXERCISE from 12/09/2015 in [place]  Date  11/04/15  Educator  Andi Hence, RN  Instruction Review Code  2- meets goals/outcomes      Heart Attack, Angina, and Risk Factor Modification:  -Group instruction provided by verbal instruction, video, and written materials to support subject.  Instructors address signs and symptoms of angina and heart attacks.    Also discuss risk factors for heart disease and how to make changes to improve heart health risk factors. Flowsheet Row CARDIAC REHAB PHASE II EXERCISE from 12/09/2015 in [place]  Date  12/09/15  Instruction Review Code  2- meets goals/outcomes      Functional Fitness:  -Group instruction provided by verbal instruction, demonstration, patient participation, and written materials to support subject.  Instructors address safety measures for doing things around the house.  Discuss how to get up and down off the floor, how to pick things up properly, how to safely get out of a chair without assistance, and balance training.   Meditation and Mindfulness:  -Group instruction provided by verbal instruction, patient  participation, and written materials to support subject.  Instructor addresses importance of mindfulness and meditation practice to help reduce stress and improve awareness.  Instructor also leads participants through a meditation exercise.  Flowsheet Row CARDIAC REHAB PHASE II EXERCISE from 12/09/2015 in [place]  Date  10/26/15  Instruction Review Code  2- meets goals/outcomes      Stretching for Flexibility and Mobility:  -Group instruction provided by verbal instruction, patient participation, and written materials to support subject.  Instructors lead participants through series of stretches that are designed to increase flexibility thus improving mobility.  These stretches are additional exercise for major muscle groups that are typically performed during regular warm up and cool down. Flowsheet Row CARDIAC REHAB PHASE II EXERCISE from 12/09/2015 in [place]  Date  10/28/15  Instruction Review Code  2- meets goals/outcomes      Hands Only CPR Anytime:  -Group instruction provided by verbal instruction, video, patient participation and written materials to support subject.  Instructors co-teach with [facility] video for hands only CPR.  Participants get hands on experience with mannequins.   Nutrition I class: Heart Healthy Eating:  -Group instruction provided by PowerPoint slides, verbal discussion, and written materials to support subject matter. The instructor gives an explanation and review of the Therapeutic Lifestyle Changes diet recommendations, which includes a discussion on lipid goals, dietary fat, sodium, fiber, plant stanol/sterol esters, sugar, and the components of a well-balanced, healthy  diet. Flowsheet Row CARDIAC REHAB PHASE II EXERCISE from 12/09/2015 in [facility] C[facility]  Date  12/05/15  Educator  RD  Instruction Review Code  Not applicable [class handouts given]      Nutrition II  class: Lifestyle Skills:  -Group instruction provided by PowerPoint slides, verbal discussion, and written materials to support subject matter. The instructor gives an explanation and review of label reading, grocery shopping for heart health, heart healthy recipe modifications, and ways to make healthier choices when eating out. Flowsheet Row CARDIAC REHAB PHASE II EXERCISE from 12/09/2015 in [facility]  Date  12/05/15  Educator  RD  Instruction Review Code  Not applicable [class handouts given]      Diabetes Question & Answer:  -Group instruction provided by PowerPoint slides, verbal discussion, and written materials to support subject matter. The instructor gives an explanation and review of diabetes co-morbidities, pre- and post-prandial blood glucose goals, pre-exercise blood glucose goals, signs, symptoms, and treatment of hypoglycemia and hyperglycemia, and foot care basics.   Diabetes Blitz:  -Group instruction provided by PowerPoint slides, verbal discussion, and written materials to support subject matter. The instructor gives an explanation and review of the physiology behind type 1 and type 2 diabetes, diabetes medications and rational behind using different medications, pre- and post-prandial blood glucose recommendations and Hemoglobin A1c goals, diabetes diet, and exercise including blood glucose guidelines for exercising safely.    Portion Distortion:  -Group instruction provided by PowerPoint slides, verbal discussion, written materials, and food models to support subject matter. The instructor gives an explanation of serving size versus portion size, changes in portions sizes over the last 20 years, and what consists of a serving from each food group.   Stress Management:  -Group instruction provided by verbal instruction, video, and written materials to support subject matter.  Instructors review role of stress in heart disease and how to cope  with stress positively.   Flowsheet Row CARDIAC REHAB PHASE II EXERCISE from 12/09/2015 in [facility]  Date  11/16/15  Instruction Review Code  2- meets goals/outcomes      Exercising on Your Own:  -Group instruction provided by verbal instruction, power point, and written materials to support subject.  Instructors discuss benefits of exercise, components of exercise, frequency and intensity of exercise, and end points for exercise.  Also discuss use of nitroglycerin and activating EMS.  Review options of places to exercise outside of rehab.  Review guidelines for sex with heart disease. Flowsheet Row CARDIAC REHAB PHASE II EXERCISE from 12/09/2015 in [facility]  Date  11/18/15  Instruction Review Code  2- meets goals/outcomes      Cardiac Drugs I:  -Group instruction provided by verbal instruction and written materials to support subject.  Instructor reviews cardiac drug classes: antiplatelets, anticoagulants, beta blockers, and statins.  Instructor discusses reasons, side effects, and lifestyle considerations for each drug class.   Cardiac Drugs II:  -Group instruction provided by verbal instruction and written materials to support subject.  Instructor reviews cardiac drug classes: angiotensin converting enzyme inhibitors (ACE-I), angiotensin II receptor blockers (ARBs), nitrates, and calcium channel blockers.  Instructor discusses reasons, side effects, and lifestyle considerations for each drug class.   Anatomy and Physiology of the Circulatory System:  -Group instruction provided by verbal instruction, video, and written materials to support subject.  Reviews functional anatomy of heart, how it relates to various diagnoses, and what role   the heart plays in the overall system.   Knowledge Questionnaire Score:     Knowledge Questionnaire Score - 10/13/15 1202      Knowledge Questionnaire Score   Pre Score 19/24       Core Components/Risk Factors/Patient Goals at Admission:     Personal Goals and Risk Factors at Admission - 10/13/15 0901      Core Components/Risk Factors/Patient Goals on Admission   Personal Goal Lose 5-10lbs and be able to complete cardiac rehab and know limitations   Intervention Provide exercise guidelines to improve aerobic fitness and provide cardiac education classes to increase awareness and knowledge on CVD   Expected Outcomes Pt will complete cardiac rehab and lose 5-10lbs      Core Components/Risk Factors/Patient Goals Review:      Goals and Risk Factor Review    Row Name 11/08/15 0840 12/06/15 0959           Core Components/Risk Factors/Patient Goals Review   Personal Goals Review Other Other;Increase Strength and Stamina      Review Pt is enjoys CRPII and finds it helpful/beneficial. 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 Pt has increased cardiac awareness and feels as though getting stronger      Expected Outcomes Pt will continue to show progress in CRPII and improve in flexibilty/decrease soreness in UE and chest region Pt will continue to perform work-related duties without difficulty and show improvement cardiovascular/aerobic fitness levels         Core Components/Risk Factors/Patient Goals at Discharge (Final Review):      Goals and Risk Factor Review - 12/06/15 0959      Core Components/Risk Factors/Patient Goals Review   Personal Goals Review Other;Increase Strength and Stamina   Review Pt laid hardwood floors on 12/04/15 without difficulty. Pt has increased cardiac awareness and feels as though getting stronger   Expected Outcomes Pt will continue to perform work-related duties without difficulty and show improvement cardiovascular/aerobic fitness levels      ITP Comments:     ITP Comments    Row Name 10/13/15 0845 12/02/15 0815         ITP  Comments Dr. Fransico Him, Medical Director Attended Education Class: Hypertension 12/02/15. Met outcomes/goals.         Comments: pt dropped from program for nonattendance due to work conflict.  Pt attended 13 sessions. Pt plans to exercise on his own.

## 2016-01-09 NOTE — Addendum Note (Signed)
Encounter addended by: Lowell Guitar, RN on: 01/09/2016  7:44 AM<BR>    Actions taken: Vitals modified, Sign clinical note

## 2016-01-11 ENCOUNTER — Encounter (HOSPITAL_COMMUNITY): Payer: BLUE CROSS/BLUE SHIELD

## 2016-01-13 ENCOUNTER — Encounter (HOSPITAL_COMMUNITY): Payer: BLUE CROSS/BLUE SHIELD

## 2016-01-16 ENCOUNTER — Encounter (HOSPITAL_COMMUNITY): Payer: BLUE CROSS/BLUE SHIELD

## 2016-01-18 ENCOUNTER — Encounter (HOSPITAL_COMMUNITY): Payer: BLUE CROSS/BLUE SHIELD

## 2016-01-20 ENCOUNTER — Encounter (HOSPITAL_COMMUNITY): Payer: BLUE CROSS/BLUE SHIELD

## 2016-01-23 ENCOUNTER — Encounter (HOSPITAL_COMMUNITY): Payer: BLUE CROSS/BLUE SHIELD

## 2016-01-25 ENCOUNTER — Encounter (HOSPITAL_COMMUNITY): Payer: BLUE CROSS/BLUE SHIELD

## 2016-01-25 ENCOUNTER — Telehealth (HOSPITAL_COMMUNITY): Payer: Self-pay | Admitting: Cardiac Rehabilitation

## 2016-01-25 NOTE — Telephone Encounter (Signed)
pc to pt to assess reason for continued absence from cardiac rehab. LMOM 

## 2016-01-27 ENCOUNTER — Encounter (HOSPITAL_COMMUNITY): Payer: BLUE CROSS/BLUE SHIELD

## 2016-01-29 ENCOUNTER — Other Ambulatory Visit: Payer: Self-pay | Admitting: Family Medicine

## 2016-01-30 ENCOUNTER — Encounter (HOSPITAL_COMMUNITY): Payer: BLUE CROSS/BLUE SHIELD

## 2016-02-01 ENCOUNTER — Encounter (HOSPITAL_COMMUNITY): Payer: BLUE CROSS/BLUE SHIELD

## 2016-02-03 ENCOUNTER — Encounter (HOSPITAL_COMMUNITY): Payer: BLUE CROSS/BLUE SHIELD

## 2016-02-04 ENCOUNTER — Other Ambulatory Visit: Payer: Self-pay | Admitting: Family Medicine

## 2016-02-06 ENCOUNTER — Encounter (HOSPITAL_COMMUNITY): Payer: BLUE CROSS/BLUE SHIELD

## 2016-02-06 NOTE — Telephone Encounter (Signed)
Refill for 6 months. 

## 2016-02-06 NOTE — Telephone Encounter (Signed)
Last OV 12/20/2015 Last refill 08-09-2015 #[phone], 5rf Please advise

## 2016-02-08 ENCOUNTER — Encounter (HOSPITAL_COMMUNITY): Payer: BLUE CROSS/BLUE SHIELD

## 2016-02-10 ENCOUNTER — Encounter (HOSPITAL_COMMUNITY): Payer: BLUE CROSS/BLUE SHIELD

## 2016-02-13 ENCOUNTER — Encounter (HOSPITAL_COMMUNITY): Payer: BLUE CROSS/BLUE SHIELD

## 2016-02-15 ENCOUNTER — Encounter (HOSPITAL_COMMUNITY): Payer: BLUE CROSS/BLUE SHIELD

## 2016-02-15 ENCOUNTER — Other Ambulatory Visit: Payer: Self-pay | Admitting: Family Medicine

## 2016-02-15 ENCOUNTER — Telehealth: Payer: Self-pay | Admitting: *Deleted

## 2016-02-15 NOTE — Telephone Encounter (Signed)
[name] followed up with patient after sending Orion-10 consent. Patient stated he is just too busy at work to do research study at this time.

## 2016-02-17 ENCOUNTER — Encounter (HOSPITAL_COMMUNITY): Payer: BLUE CROSS/BLUE SHIELD

## 2016-02-28 NOTE — Progress Notes (Signed)
Discharge Summary  Patient Details  Name: [name] [name] MRN: [id number] Date of Birth: 1965-01-16 Referring Provider:   Flowsheet Row CARDIAC REHAB PHASE II ORIENTATION from 10/13/2015 in [facility]  Referring Provider  Jenkins Rouge, MD       Number of Visits: 13  Reason for Discharge:  Patient independent in their exercise.  Pt unable to continue cardiac rehab due to returning to work. No post assessments obtained.   Smoking History:  History  Smoking Status  . Former Smoker  . Packs/day: 1.00  . Years: 8.00  . Types: Cigarettes  . Quit date: 08/14/2015  Smokeless Tobacco  . 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     Resistance Training   Training Prescription Yes   Weight 4lbs   Reps 10-12      Discharge Exercise Prescription (Final Exercise Prescription Changes):     Exercise Prescription Changes - 12/06/15 1000      Response to Exercise   Blood Pressure (Admit) 120/80   Blood Pressure (Exercise) 150/90   Blood Pressure (Exit) 124/90   Heart Rate (Admit) 71 bpm   Heart Rate (Exercise) 120 bpm   Heart Rate (Exit) 81 bpm   Rating of Perceived Exertion (Exercise) 12   Symptoms none   Comments Reviewed HEP on 10/27   Duration Progress to 30 minutes of continuous aerobic without signs/symptoms of physical  distress   Intensity THRR unchanged     Progression   Progression Continue to progress workloads to maintain intensity without signs/symptoms of physical distress.   Average METs 6.3     Resistance Training   Training Prescription Yes   Weight 5lbs   Reps 10-12     Treadmill   MPH 3.5   Grade 5   Minutes 10   METs 6.09     Bike   Level 2.5   Minutes 10   METs 6.21     NuStep   Level 6   Minutes 10   METs 6.6     Home Exercise Plan   Plans to continue exercise at Home  HEP reviewed on 10/28/15. See progress note   Frequency Add 3 additional days to program exercise sessions.     Exercise Review   Progression Yes      Functional Capacity:     6 Minute Walk    Row Name 10/13/15 1129 10/13/15 1134       6 Minute Walk   Phase Initial  -    Distance 2113 feet  -    Walk Time 6 minutes  -    # of Rest  Breaks 0  -    MPH 4  -    METS 5.39  -    RPE 7  -    Perceived Dyspnea  0  -    VO2 Peak 18.87  -    Symptoms No  -    Resting HR 62 bpm  -    Resting BP 124/80  -    Max Ex. HR 91 bpm  -    Max Ex. BP 142/82 148/82    2 Minute Post BP 120/88  -       Psychological, QOL, Others - Outcomes: PHQ 2/9: Depression screen PHQ 2/9 10/17/2015  Decreased Interest 0  Down, Depressed, Hopeless 0  PHQ - 2 Score 0    Quality of Life:     Quality of Life - 10/13/15 1130      Quality of Life Scores   Health/Function Pre 27.12 %   Socioeconomic Pre 25.57 %   Psych/Spiritual Pre 30 %   Family Pre 28.5 %   GLOBAL Pre 27.6 %      Personal Goals: Goals established at orientation with interventions provided to work toward goal.     Personal Goals and Risk Factors at Admission - 10/13/15 0901      Core Components/Risk Factors/Patient Goals on Admission   Personal Goal Lose 5-10lbs and be able to complete cardiac rehab and know limitations   Intervention Provide exercise guidelines to improve aerobic fitness and provide cardiac education classes to  increase awareness and knowledge on CVD   Expected Outcomes Pt will complete cardiac rehab and lose 5-10lbs       Personal Goals Discharge:     Goals and Risk Factor Review    Row Name 11/08/15 0840 12/06/15 0959           Core Components/Risk Factors/Patient Goals Review   Personal Goals Review Other Other;Increase Strength and Stamina      Review Pt is enjoys CRPII and finds it helpful/beneficial. 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 Pt has increased cardiac awareness and feels as though getting stronger      Expected Outcomes Pt will continue to show progress in CRPII and improve in flexibilty/decrease soreness in UE and chest region Pt will continue to perform work-related duties without difficulty and show improvement cardiovascular/aerobic fitness levels         Nutrition & Weight - Outcomes:     Pre Biometrics - 10/13/15 1209      Pre Biometrics   Waist Circumference 40.25 inches   Hip Circumference 39.5 inches   Waist to Hip Ratio 1.02 %   Triceps Skinfold 17 mm   % Body Fat 27.3 %   Grip Strength 46 kg   Flexibility 13.5 in   Single Leg Stand 30 seconds       Nutrition:     Nutrition Therapy & Goals - 10/14/15 1201      Nutrition Therapy   Diet Therapeutic Lifestyle Changes     Intervention Plan   Intervention Prescribe, educate and counsel regarding individualized specific dietary modifications aiming towards targeted core components such as weight, hypertension, lipid management, diabetes, heart failure and other comorbidities.   Expected Outcomes Short Term Goal: Understand basic principles of dietary content, such as calories, fat, sodium, cholesterol and nutrients.;Long Term Goal: Adherence to prescribed nutrition plan.      Nutrition Discharge:     Nutrition  Assessments - 10/14/15 1202      MEDFICTS Scores   Pre Score 39      Education  Questionnaire Score:     Knowledge Questionnaire Score - 10/13/15 1202      Knowledge Questionnaire Score   Pre Score 19/24      Goals reviewed with patient; copy given to patient.

## 2016-02-28 NOTE — Addendum Note (Signed)
Encounter addended by: Lowell Guitar, RN on: 02/28/2016 11:14 AM<BR>    Actions taken: Episode resolved

## 2016-02-28 NOTE — Addendum Note (Signed)
Encounter addended by: Lowell Guitar, RN on: 02/28/2016 11:13 AM<BR>    Actions taken: Sign clinical note

## 2016-06-19 ENCOUNTER — Other Ambulatory Visit: Payer: Self-pay | Admitting: Family Medicine

## 2016-06-19 ENCOUNTER — Telehealth: Payer: Self-pay

## 2016-06-19 NOTE — Telephone Encounter (Signed)
Received PA request for Cialis. PA submitted & is pending. Key: [id number]

## 2016-06-20 NOTE — Telephone Encounter (Signed)
Patient has to try Viagra first.

## 2016-06-21 NOTE — Telephone Encounter (Signed)
He cannot take any ED medication (Viagra, Cialis, Levitra) if on nitroglycerin.  He now has cardiac history (recent CABG).  I know he does not take daily NTG, but we really need to have office follow up to discuss issue of ED meds in pt that may need NTG.

## 2016-06-22 NOTE — Telephone Encounter (Signed)
Spoke with patient and he is in [place] and not sure when he will be back.  Unable to schedule an appointment at this time.

## 2016-08-03 ENCOUNTER — Other Ambulatory Visit: Payer: Self-pay | Admitting: Family Medicine

## 2016-08-06 NOTE — Telephone Encounter (Signed)
Last refill 02/06/16 and last office visit 12/20/15. Okay to fill?

## 2016-08-06 NOTE — Telephone Encounter (Signed)
Refill once and schedule office follow-up to discuss

## 2016-08-29 ENCOUNTER — Ambulatory Visit (INDEPENDENT_AMBULATORY_CARE_PROVIDER_SITE_OTHER): Payer: BLUE CROSS/BLUE SHIELD | Admitting: Family Medicine

## 2016-08-29 ENCOUNTER — Encounter: Payer: Self-pay | Admitting: Family Medicine

## 2016-08-29 VITALS — BP 110/80 | HR 75 | Temp 98.3°F | Wt 194.4 lb

## 2016-08-29 DIAGNOSIS — Z23 Encounter for immunization: Secondary | ICD-10-CM | POA: Diagnosis not present

## 2016-08-29 DIAGNOSIS — K219 Gastro-esophageal reflux disease without esophagitis: Secondary | ICD-10-CM

## 2016-08-29 DIAGNOSIS — E785 Hyperlipidemia, unspecified: Secondary | ICD-10-CM | POA: Diagnosis not present

## 2016-08-29 DIAGNOSIS — F411 Generalized anxiety disorder: Secondary | ICD-10-CM | POA: Diagnosis not present

## 2016-08-29 DIAGNOSIS — Z951 Presence of aortocoronary bypass graft: Secondary | ICD-10-CM | POA: Diagnosis not present

## 2016-08-29 MED ORDER — AMOXICILLIN 500 MG PO CAPS: ORAL_CAPSULE | ORAL | 0 refills | Status: DC

## 2016-08-29 MED ORDER — CLONAZEPAM 1 MG PO TABS: 1.0000 mg | ORAL_TABLET | Freq: Two times a day (BID) | ORAL | 5 refills | Status: DC

## 2016-08-29 NOTE — Progress Notes (Signed)
Subjective:     Patient ID: [name] [name], male   DOB: 1965-12-26, 51 y.o.   MRN: [id number]  HPI Patient seen for routine medical follow-up. He has history of CAD with bypass last summer, hyperlipidemia, chronic anxiety, GERD. Medications reviewed. Compliant with all. No recent chest pains. His work is fairly physical. He Just returned from [place], [place] where he was working up to 70 hours per week. Frequently working outdoors. Good tolerance to physical activity. Remains on high-dose Lipitor along with aspirin and metoprolol. No dizziness. No dyspnea. Unfortunately, he still smokes  Getting ready to have dental procedure with cleaning. His dentist insisted apparently on him getting premedicated though he has no valvular heart problems. He has no allergies to penicillin.  Has been on Klonopin chronically for several years for anxiety symptoms. Apparently did not tolerate SSRI medications.  Long-standing history of GERD. Stable on Protonix. He's tried several times to stop his had breakthrough symptoms. Denies any appetite or weight changes. No dysphagia.  Past Medical History:  Diagnosis Date  . Acute bronchitis 03/31/2009  . ANXIETY 11/10/2008  . Arthritis   . Coronary artery disease   . EPIGASTRIC PAIN 12/08/2009  . GERD (gastroesophageal reflux disease)   . 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 Other and unspecified hyperlipidemia 12/29/2008   Past Surgical History:  Procedure Laterality Date  . CARDIAC CATHETERIZATION N/A 08/11/2015   Procedure: Left Heart Cath and Coronary Angiography;  Surgeon: Nelva Bush, MD;  Location: [facility];  Service: Cardiovascular;  Laterality: N/A;  . COLONOSCOPY    . CORONARY ARTERY BYPASS GRAFT N/A 08/17/2015   Procedure: CORONARY ARTERY BYPASS GRAFTING (CABG) times five, LIMA to LAD, SVG to Diagonal sequentially SVG to OM-PL and Left Radial Artery to PD;  Surgeon: Melrose Nakayama, MD;  Location: [facility];  Service: Open  Heart Surgery;  Laterality: N/A;  . RADIAL ARTERY HARVEST Left 08/17/2015   Procedure: RADIAL ARTERY HARVEST;  Surgeon: Melrose Nakayama, MD;  Location: [facility];  Service: Open Heart Surgery;  Laterality: Left;  . TEE WITHOUT CARDIOVERSION N/A 08/17/2015   Procedure: TRANSESOPHAGEAL ECHOCARDIOGRAM (TEE);  Surgeon: Melrose Nakayama, MD;  Location: [facility];  Service: Open Heart Surgery;  Laterality: N/A;  . [facility]    reports that he quit smoking about 12 months ago. His smoking use included Cigarettes. He has a 8.00 pack-year smoking history. He has never used smokeless tobacco. He reports that he drinks about 1.8 oz of alcohol per week . He reports that he does not use drugs. family history includes Cancer in his mother; Diabetes in his brother; Heart disease in his paternal aunt and paternal uncle; Heart disease (age of onset: 40) in his father. No Known Allergies   Review of Systems  Constitutional: Negative for fatigue.  Eyes: Negative for visual disturbance.  Respiratory: Negative for cough, chest tightness and shortness of breath.   Cardiovascular: Negative for chest pain, palpitations and leg swelling.  Neurological: Negative for dizziness, syncope, weakness, light-headedness and headaches.       Objective:   Physical Exam  Constitutional: He is oriented to person, place, and time. He appears well-developed and well-nourished.  HENT:  Right Ear: External ear normal.  Left Ear: External ear normal.  Mouth/Throat: Oropharynx is clear and moist.  Eyes: Pupils are equal, round, and reactive to light.  Neck: Neck supple. No thyromegaly present.  Cardiovascular: Normal rate and regular rhythm.   Pulmonary/Chest: Effort normal  and breath sounds normal. No respiratory distress. He has no wheezes. He has no rales.  Musculoskeletal: He exhibits no edema.  Neurological: He is alert and oriented to person, place, and time.       Assessment:     #1 history of  CAD with previous coronary artery bypass graft  #2 dyslipidemia  #3 ongoing  nicotine use  #4 chronic anxiety  #5 GERD    Plan:     -Continue current medications. -Strongly encouraged to stop smoking -Flu vaccine given -Schedule physical for December and obtain follow-up screening labs then -Prescription for amoxicillin 500 milligrams take 4 capsules 1 hour prior to procedure though we emphasized he is low risk -We've advised that he try to scale his Klonopin back gradually. We discussed potential long-term risk of Klonopin or any other benzodiazepine  [name] MD [facility]

## 2016-08-29 NOTE — Patient Instructions (Signed)
Set up physical exam for December

## 2016-10-10 ENCOUNTER — Other Ambulatory Visit: Payer: Self-pay | Admitting: Family Medicine

## 2016-10-10 NOTE — Telephone Encounter (Signed)
Medication filled to pharmacy as requested.   

## 2016-11-12 ENCOUNTER — Other Ambulatory Visit: Payer: Self-pay | Admitting: Family Medicine

## 2016-11-25 ENCOUNTER — Other Ambulatory Visit: Payer: Self-pay | Admitting: Cardiovascular Disease

## 2016-12-22 ENCOUNTER — Other Ambulatory Visit: Payer: Self-pay | Admitting: Cardiovascular Disease

## 2017-01-15 ENCOUNTER — Other Ambulatory Visit: Payer: Self-pay | Admitting: Cardiovascular Disease

## 2017-01-31 ENCOUNTER — Other Ambulatory Visit: Payer: Self-pay | Admitting: Cardiovascular Disease

## 2017-02-12 ENCOUNTER — Other Ambulatory Visit: Payer: Self-pay | Admitting: *Deleted

## 2017-02-12 ENCOUNTER — Telehealth: Payer: Self-pay | Admitting: Cardiovascular Disease

## 2017-02-12 MED ORDER — METOPROLOL SUCCINATE ER 50 MG PO TB24: ORAL_TABLET | ORAL | 0 refills | Status: DC

## 2017-02-12 NOTE — Telephone Encounter (Signed)
New message     *STAT* If patient is at the pharmacy, call can be transferred to refill team.   1. Which medications need to be refilled? (please list name of each medication and dose if known) metoprolol succinate (TOPROL-XL) 50 MG 24 hr tablet  2. Which pharmacy/location (including street and city if local pharmacy) is medication to be sent to? [facility] [place]  3. Do they need a 30 day or 90 day supply? [place]

## 2017-02-18 ENCOUNTER — Other Ambulatory Visit: Payer: Self-pay | Admitting: Family Medicine

## 2017-02-25 DIAGNOSIS — Z720 Tobacco use: Secondary | ICD-10-CM | POA: Insufficient documentation

## 2017-02-25 NOTE — Progress Notes (Signed)
Cardiology Office Note    Date:  02/26/2017   ID:  [name] [name], DOB 1965/10/13, MRN [id number]  PCP:  [name], MD  Cardiologist: Jenkins Rouge, MD  Chief Complaint  Patient presents with  . Follow-up    History of Present Illness:  [name] [name] is a 52 y.o. male with history of CAD status post CABG x5 08/2015 (left internal mammary artery to LAD, saphenous vein graft to first diagonal, sequential saphenous vein graft to obtuse marginal 1 and posterior lateral 2, left radial artery to posterior descending). , hyperlipidemia and tobacco abuse.  Patient last saw Dr. [name] 12/2015 and Toprol was increased to 50 mg daily and Imdur stopped.  Was still smoking and asked to quit.  Patient comes in today accompanied by his wife.  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  Currently he is smoking 1 pack of cigarettes a day.  He and his wife know they need to quit and hope to once he moved to [facility].  Blood pressure is elevated today.  He did have ham last night but overall eats low-sodium diet.  He does not exercise outside of work but is up and down steps and doing Architect all day long.  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  Sometimes he gets up and is pounding the head border of the wall.  He does have anxiety and takes Klonopin.  Past Medical History:  Diagnosis Date  . Acute bronchitis 03/31/2009  . ANXIETY 11/10/2008  . Arthritis   . Coronary artery disease   . EPIGASTRIC PAIN 12/08/2009  . GERD (gastroesophageal reflux disease)   . 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 Other and unspecified hyperlipidemia 12/29/2008    Past Surgical History:  Procedure Laterality Date  . CARDIAC CATHETERIZATION N/A 08/11/2015   Procedure: Left Heart Cath  and Coronary Angiography;  Surgeon: Nelva Bush, MD;  Location: [facility];  Service: Cardiovascular;  Laterality: N/A;  . COLONOSCOPY    . CORONARY ARTERY BYPASS GRAFT N/A 08/17/2015   Procedure: CORONARY ARTERY BYPASS GRAFTING (CABG) times five, LIMA to LAD, SVG to Diagonal sequentially SVG to OM-PL and Left Radial Artery to PD;  Surgeon: Melrose Nakayama, MD;  Location: [place];  Service: Open Heart Surgery;  Laterality: N/A;  . RADIAL ARTERY HARVEST Left 08/17/2015   Procedure: RADIAL ARTERY HARVEST;  Surgeon: Melrose Nakayama, MD;  Location: [place];  Service: Open Heart Surgery;  Laterality: Left;  . TEE WITHOUT CARDIOVERSION N/A 08/17/2015   Procedure: TRANSESOPHAGEAL ECHOCARDIOGRAM (TEE);  Surgeon: Melrose Nakayama, MD;  Location: [place];  Service: Open Heart Surgery;  Laterality: N/A;  . WISDOM TOOTH EXTRACTION  1989    Current Medications: Current Meds  Medication Sig  . acetaminophen (TYLENOL) 325 MG tablet Take 650 mg by mouth every 6 (six) hours as needed.  Marland Kitchen amoxicillin (AMOXIL) 500 MG capsule Take 4 capsules one hour prior to dental procedure  . aspirin 81 MG tablet Take 1 tablet (81 mg total) by mouth daily.  Marland Kitchen atorvastatin (LIPITOR) 80 MG tablet TAKE 1 TABLET BY MOUTH EVERY DAY  . CIALIS 5 MG tablet TAKE 1 TABLET (5 MG TOTAL) BY MOUTH DAILY.  . clonazePAM (KLONOPIN) 1 MG tablet Take 1 tablet (  1 mg total) by mouth 2 (two) times daily.  . metoprolol succinate (TOPROL-XL) 50 MG 24 hr tablet TAKE 1 TABLET BY MOUTH DAILY.Please keep upcoming appointment for further refills  . nitroGLYCERIN (NITROSTAT) 0.4 MG SL tablet Place 0.4 tablets under the tongue as needed for chest pain.   . pantoprazole (PROTONIX) 40 MG tablet TAKE 1 TABLET EVERY DAY  . [DISCONTINUED] aspirin EC 325 MG EC tablet Take 1 tablet (325 mg total) by mouth daily.  . [DISCONTINUED] metoprolol succinate (TOPROL-XL) 50 MG 24 hr tablet TAKE 1 TABLET BY MOUTH DAILY.Please keep upcoming appointment for  further refills     Allergies:   Patient has no known allergies.   Social History   Socioeconomic History  . Marital status: Divorced    Spouse name: None  . Number of children: None  . Years of education: None  . Highest education level: None  Social Needs  . Financial resource strain: None  . Food insecurity - worry: None  . Food insecurity - inability: None  . Transportation needs - medical: None  . Transportation needs - non-medical: None  Occupational History  . None  Tobacco Use  . Smoking status: Former Smoker    Packs/day: 1.00    Years: 8.00    Pack years: 8.00    Types: Cigarettes    Last attempt to quit: 08/14/2015    Years since quitting: 1.5  . Smokeless tobacco: Never Used  Substance and Sexual Activity  . Alcohol use: Yes    Alcohol/week: 1.8 oz    Types: 3 Cans of beer per week    Comment: social  . Drug use: No  . Sexual activity: None  Other Topics Concern  . None  Social History Narrative  . None     Family History:  The patient's family history includes Cancer in his mother; Diabetes in his brother; Heart disease in his paternal aunt and paternal uncle; Heart disease (age of onset: 80) in his father.   ROS:   Please see the history of present illness.    Review of Systems  Constitution: Negative.  HENT: Negative.   Cardiovascular: Negative.   Respiratory: Positive for snoring.   Endocrine: Negative.   Hematologic/Lymphatic: Negative.   Musculoskeletal: Negative.   Gastrointestinal: Negative.   Genitourinary: Negative.   Neurological: Negative.   Psychiatric/Behavioral: The patient is nervous/anxious.    All other systems reviewed and are negative.   PHYSICAL EXAM:   VS:  BP (!) 148/98 Comment: 132/92 had 3 cups of coffee this am  Pulse 74   Ht '5\' 11"'$  (1.803 m)   Wt 207 lb 8 oz (94.1 kg)   SpO2 98%   BMI 28.94 kg/m   Physical Exam  GEN: Well nourished, well developed, in no acute distress  Neck: no JVD, carotid bruits, or  masses Cardiac:RRR; no murmurs, rubs, or gallops  Respiratory:  clear to auscultation bilaterally, normal work of breathing GI: soft, nontender, nondistended, + BS Ext: without cyanosis, clubbing, or edema, Good distal pulses bilaterally Neuro:  Alert and Oriented x 3 Psych: euthymic mood, full affect  Wt Readings from Last 3 Encounters:  02/26/17 207 lb 8 oz (94.1 kg)  08/29/16 194 lb 6.4 oz (88.2 kg)  12/20/15 204 lb (92.5 kg)      Studies/Labs Reviewed:   EKG:  EKG is  ordered today.  The ekg ordered today demonstrates normal sinus rhythm with incomplete right bundle branch block no significant change from prior EKG  Recent  Labs: No results found for requested labs within last 8760 hours.   Lipid Panel    Component Value Date/Time   CHOL 172 12/12/2015 0922   TRIG 122.0 12/12/2015 0922   HDL 53.10 12/12/2015 0922   CHOLHDL 3 12/12/2015 0922   VLDL 24.4 12/12/2015 0922   LDLCALC 94 12/12/2015 0922   LDLDIRECT 157.6 12/21/2008 0831    Additional studies/ records that were reviewed today include:    Intraoperative Transesophageal Echocardiography 08/17/15 LV EF: 55% -   60%   ------------------------------------------------------------------- Study Conclusions   - Left ventricle: Systolic function was normal. The estimated   ejection fraction was in the range of 55% to 60%. - Aortic valve: No evidence of vegetation. - Mitral valve: No evidence of vegetation. - Left atrium: No evidence of thrombus in the atrial cavity or   appendage. No evidence of thrombus in the appendage. - Atrial septum: No defect or patent foramen ovale was identified.   Echo contrast study showed no right-to-left atrial level shunt,   following an increase in RA pressure induced by provocative   maneuvers. - Tricuspid valve: No evidence of vegetation.       Cardiac Catheterization: 08/11/2015 Left Heart Cath and Coronary Angiography  Conclusion       RPDA lesion, 90 %stenosed.  Ost  1st Diag to 1st Diag lesion, 80 %stenosed.  2nd Diag lesion, 90 %stenosed.  The left ventricular ejection fraction is 55-65% by visual estimate.  The left ventricular systolic function is normal.  LV end diastolic pressure is normal.  Prox RCA to Mid RCA lesion, 70 %stenosed.  Post Atrio lesion, 70 %stenosed.  3rd RPLB lesion, 90 %stenosed.  Ost LAD to Prox LAD lesion, 50 %stenosed.  1st Mrg lesion, 80 %stenosed.  LM lesion, 40 %stenosed.  Mid LAD to Dist LAD lesion, 90 %stenosed.   1.  Severe multivessel coronary artery disease involving the proximal/mid LAD, Diagonal 1 &2,  OM1, proximal/mid RCA, rPDA, and rPL branch. 2.  Normal left ventricular contraction. 3.  Upper normal left ventricular filling pressure.   Plan: 1.  Expedited outpatient cardiac surgery consultation for CABG tomorrow or early next week. 2.  Start ASA 81 mg daily, atorvastatin 80 mg daily, and prn sublingual nitroglycerin.          ASSESSMENT:    1. S/P CABG x 5   2. Essential hypertension   3. Hyperlipidemia, unspecified hyperlipidemia type   4. Tobacco abuse   5. Night terrors      PLAN:  In order of problems listed above:  Status post CABG x5:08/2015 (left internal mammary artery to LAD, saphenous vein graft to first diagonal, sequential saphenous vein graft to obtuse marginal 1 and posterior lateral 2, left radial artery to posterior descending).  Doing well without angina.  Continue aspirin and Lipitor and Toprol.  Patient having night terrors since his bypass surgery.  Beta-blockers can cause some of these symptoms and I told him we could switch him to bisoprolol but he wants to hold off at this time.Decrease ASA to 81 mg daily.  Hypertension blood pressure is up today.  We will add HCTZ 25 mg once daily.  Patient will buy a blood pressure cuff today and call us next week with blood pressure readings.   Hyperlipidemia check fasting lipid panel C met and CBC today.  Patient did have  some coffee with sugar and and creamer so we will keep that in mind.  He does not want to come back for  labs.  Tobacco abuse long discussion about smoking cessation.  Patient is wife both hope to quit once he moved to [facility].  Will prescribe nicotine patches.  Night terrors since his surgery taking him back to [employer] days.  Possibly related to beta-blocker and could try switching to bisoprolol but patient wants to hold off for now.   Medication Adjustments/Labs and Tests Ordered: Current medicines are reviewed at length with the patient today.  Concerns regarding medicines are outlined above.  Medication changes, Labs and Tests ordered today are listed in the Patient Instructions below. Patient Instructions  Medication Instructions:  Your physician has recommended you make the following change in your medication:  1-START Hydroclorothizide 25 mg by mouth daily 2-START Nicotine patch 3-Decrease Aspirin 81 mg by mouth daily   Labwork: Your physician recommends that you have lab work today- fasting lipid panel, CMET, and CBC  Testing/Procedures: NONE  Follow-Up: Your physician wants you to follow-up in: 12 months with Dr. [name]. You will receive a reminder letter in the mail two months in advance. If you don't receive a letter, please call our office to schedule the follow-up appointment.   If you need a refill on your cardiac medications before your next appointment, please call your pharmacy.    Low-Sodium Eating Plan Sodium, which is an element that makes up salt, helps you maintain a healthy balance of fluids in your body. Too much sodium can increase your blood pressure and cause fluid and waste to be held in your body. Your health care provider or dietitian may recommend following this plan if you have high blood pressure (hypertension), kidney disease, liver disease, or heart failure. Eating less sodium can help lower your blood pressure, reduce swelling, and protect your  heart, liver, and kidneys. What are tips for following this plan? General guidelines  Most people on this plan should limit their sodium intake to 1,500-2,000 mg (milligrams) of sodium each day. Reading food labels  The Nutrition Facts label lists the amount of sodium in one serving of the food. If you eat more than one serving, you must multiply the listed amount of sodium by the number of servings.  Choose foods with less than 140 mg of sodium per serving.  Avoid foods with 300 mg of sodium or more per serving. Shopping  Look for lower-sodium products, often labeled as "low-sodium" or "no salt added."  Always check the sodium content even if foods are labeled as "unsalted" or "no salt added".  Buy fresh foods. ? Avoid canned foods and premade or frozen meals. ? Avoid canned, cured, or processed meats  Buy breads that have less than 80 mg of sodium per slice. Cooking  Eat more home-cooked food and less restaurant, buffet, and fast food.  Avoid adding salt when cooking. Use salt-free seasonings or herbs instead of table salt or sea salt. Check with your health care provider or pharmacist before using salt substitutes.  Cook with plant-based oils, such as canola, sunflower, or olive oil. Meal planning  When eating at a restaurant, ask that your food be prepared with less salt or no salt, if possible.  Avoid foods that contain MSG (monosodium glutamate). MSG is sometimes added to [place] food, bouillon, and some canned foods. What foods are recommended? The items listed may not be a complete list. Talk with your dietitian about what dietary choices are best for you. Grains Low-sodium cereals, including oats, puffed wheat and rice, and shredded wheat. Low-sodium crackers. Unsalted rice. Unsalted pasta.  Low-sodium bread. Whole-grain breads and whole-grain pasta. Vegetables Fresh or frozen vegetables. "No salt added" canned vegetables. "No salt added" tomato sauce and paste.  Low-sodium or reduced-sodium tomato and vegetable juice. Fruits Fresh, frozen, or canned fruit. Fruit juice. Meats and other protein foods Fresh or frozen (no salt added) meat, poultry, seafood, and fish. Low-sodium canned tuna and salmon. Unsalted nuts. Dried peas, beans, and lentils without added salt. Unsalted canned beans. Eggs. Unsalted nut butters. Dairy Milk. Soy milk. Cheese that is naturally low in sodium, such as ricotta cheese, fresh mozzarella, or Swiss cheese Low-sodium or reduced-sodium cheese. Cream cheese. Yogurt. Fats and oils Unsalted butter. Unsalted margarine with no trans fat. Vegetable oils such as canola or olive oils. Seasonings and other foods Fresh and dried herbs and spices. Salt-free seasonings. Low-sodium mustard and ketchup. Sodium-free salad dressing. Sodium-free light mayonnaise. Fresh or refrigerated horseradish. Lemon juice. Vinegar. Homemade, reduced-sodium, or low-sodium soups. Unsalted popcorn and pretzels. Low-salt or salt-free chips. What foods are not recommended? The items listed may not be a complete list. Talk with your dietitian about what dietary choices are best for you. Grains Instant hot cereals. Bread stuffing, pancake, and biscuit mixes. Croutons. Seasoned rice or pasta mixes. Noodle soup cups. Boxed or frozen macaroni and cheese. Regular salted crackers. Self-rising flour. Vegetables Sauerkraut, pickled vegetables, and relishes. Olives. [place] fries. Onion rings. Regular canned vegetables (not low-sodium or reduced-sodium). Regular canned tomato sauce and paste (not low-sodium or reduced-sodium). Regular tomato and vegetable juice (not low-sodium or reduced-sodium). Frozen vegetables in sauces. Meats and other protein foods Meat or fish that is salted, canned, smoked, spiced, or pickled. Bacon, ham, sausage, hotdogs, corned beef, chipped beef, packaged lunch meats, salt pork, jerky, pickled herring, anchovies, regular canned tuna, sardines, salted  nuts. Dairy Processed cheese and cheese spreads. Cheese curds. Blue cheese. Feta cheese. String cheese. Regular cottage cheese. Buttermilk. Canned milk. Fats and oils Salted butter. Regular margarine. Ghee. Bacon fat. Seasonings and other foods Onion salt, garlic salt, seasoned salt, table salt, and sea salt. Canned and packaged gravies. Worcestershire sauce. Tartar sauce. Barbecue sauce. Teriyaki sauce. Soy sauce, including reduced-sodium. Steak sauce. Fish sauce. Oyster sauce. Cocktail sauce. Horseradish that you find on the shelf. Regular ketchup and mustard. Meat flavorings and tenderizers. Bouillon cubes. Hot sauce and Tabasco sauce. Premade or packaged marinades. Premade or packaged taco seasonings. Relishes. Regular salad dressings. Salsa. Potato and tortilla chips. Corn chips and puffs. Salted popcorn and pretzels. Canned or dried soups. Pizza. Frozen entrees and pot pies. Summary  Eating less sodium can help lower your blood pressure, reduce swelling, and protect your heart, liver, and kidneys.  Most people on this plan should limit their sodium intake to 1,500-2,000 mg (milligrams) of sodium each day.  Canned, boxed, and frozen foods are high in sodium. Restaurant foods, fast foods, and pizza are also very high in sodium. You also get sodium by adding salt to food.  Try to cook at home, eat more fresh fruits and vegetables, and eat less fast food, canned, processed, or prepared foods. This information is not intended to replace advice given to you by your health care provider. Make sure you discuss any questions you have with your health care provider. Document Released: 06/09/2001 Document Revised: 12/12/2015 Document Reviewed: 12/12/2015 Elsevier Interactive Patient Education  2018 [employer]. Hydrochlorothiazide, HCTZ capsules or tablets What is this medicine? HYDROCHLOROTHIAZIDE (hye droe klor oh THYE a zide) is a diuretic. It increases the amount of urine passed, which causes  the body  to lose salt and water. This medicine is used to treat high blood pressure. It is also reduces the swelling and water retention caused by various medical conditions, such as heart, liver, or kidney disease. This medicine may be used for other purposes; ask your health care provider or pharmacist if you have questions. COMMON BRAND NAME(S): Esidrix, Ezide, HydroDIURIL, Microzide, Oretic, Zide What should I tell my health care provider before I take this medicine? They need to know if you have any of these conditions: -diabetes -gout -immune system problems, like lupus -kidney disease or kidney stones -liver disease -pancreatitis -small amount of urine or difficulty passing urine -an unusual or allergic reaction to hydrochlorothiazide, sulfa drugs, other medicines, foods, dyes, or preservatives -pregnant or trying to get pregnant -breast-feeding How should I use this medicine? Take this medicine by mouth with a glass of water. Follow the directions on the prescription label. Take your medicine at regular intervals. Remember that you will need to pass urine frequently after taking this medicine. Do not take your doses at a time of day that will cause you problems. Do not stop taking your medicine unless your doctor tells you to. Talk to your pediatrician regarding the use of this medicine in children. Special care may be needed. Overdosage: If you think you have taken too much of this medicine contact a poison control center or emergency room at once. NOTE: This medicine is only for you. Do not share this medicine with others. What if I miss a dose? If you miss a dose, take it as soon as you can. If it is almost time for your next dose, take only that dose. Do not take double or extra doses. What may interact with this medicine? -cholestyramine -colestipol -digoxin -dofetilide -lithium -medicines for blood pressure -medicines for diabetes -medicines that relax muscles for  surgery -other diuretics -steroid medicines like prednisone or cortisone This list may not describe all possible interactions. Give your health care provider a list of all the medicines, herbs, non-prescription drugs, or dietary supplements you use. Also tell them if you smoke, drink alcohol, or use illegal drugs. Some items may interact with your medicine. What should I watch for while using this medicine? Visit your doctor or health care professional for regular checks on your progress. Check your blood pressure as directed. Ask your doctor or health care professional what your blood pressure should be and when you should contact him or her. You may need to be on a special diet while taking this medicine. Ask your doctor. Check with your doctor or health care professional if you get an attack of severe diarrhea, nausea and vomiting, or if you sweat a lot. The loss of too much body fluid can make it dangerous for you to take this medicine. You may get drowsy or dizzy. Do not drive, use machinery, or do anything that needs mental alertness until you know how this medicine affects you. Do not stand or sit up quickly, especially if you are an older patient. This reduces the risk of dizzy or fainting spells. Alcohol may interfere with the effect of this medicine. Avoid alcoholic drinks. This medicine may affect your blood sugar level. If you have diabetes, check with your doctor or health care professional before changing the dose of your diabetic medicine. This medicine can make you more sensitive to the sun. Keep out of the sun. If you cannot avoid being in the sun, wear protective clothing and use sunscreen. Do not use sun  lamps or tanning beds/booths. What side effects may I notice from receiving this medicine? Side effects that you should report to your doctor or health care professional as soon as possible: -allergic reactions such as skin rash or itching, hives, swelling of the lips, mouth, tongue,  or throat -changes in vision -chest pain -eye pain -fast or irregular heartbeat -feeling faint or lightheaded, falls -gout attack -muscle pain or cramps -pain or difficulty when passing urine -pain, tingling, numbness in the hands or feet -redness, blistering, peeling or loosening of the skin, including inside the mouth -unusually weak or tired Side effects that usually do not require medical attention (report to your doctor or health care professional if they continue or are bothersome): -change in sex drive or performance -dry mouth -headache -stomach upset This list may not describe all possible side effects. Call your doctor for medical advice about side effects. You may report side effects to FDA at 1-800-FDA-1088. Where should I keep my medicine? Keep out of the reach of children. Store at room temperature between 15 and 30 degrees C (59 and 86 degrees F). Do not freeze. Protect from light and moisture. Keep container closed tightly. Throw away any unused medicine after the expiration date. NOTE: This sheet is a summary. It may not cover all possible information. If you have questions about this medicine, talk to your doctor, pharmacist, or health care provider.  2018 Elsevier/Gold Standard (2009-08-12 12:57:37) Nicotine skin patches What is this medicine? NICOTINE ([place] teen) helps people stop smoking. The patches replace the nicotine found in cigarettes and help to decrease withdrawal effects. They are most effective when used in combination with a stop-smoking program. This medicine may be used for other purposes; ask your health care provider or pharmacist if you have questions. COMMON BRAND NAME(S): Habitrol, Nicoderm CQ, Nicotrol What should I tell my health care provider before I take this medicine? They need to know if you have any of these conditions: -diabetes -heart disease, angina, irregular heartbeat or previous heart attack -high blood pressure -lung disease,  including asthma -overactive thyroid -pheochromocytoma -seizures or a history of seizures -skin problems, like eczema -stomach problems or ulcers -an unusual or allergic reaction to nicotine, adhesives, other medicines, foods, dyes, or preservatives -pregnant or trying to get pregnant -breast-feeding How should I use this medicine? This medicine is for use on the skin. Follow the directions that come with the patches. Find an area of skin on your upper arm, chest, or back that is clean, dry, greaseless, undamaged and hairless. Wash hands with plain soap and water. Do not use anything that contains aloe, lanolin or glycerin as these may prevent the patch from sticking. Dry thoroughly. Remove the patch from the sealed pouch. Do not try to cut or trim the patch. Using your palm, press the patch firmly in place for 10 seconds to make sure that there is good contact with your skin. After applying the patch, wash your hands. Change the patch every day, keeping to a regular schedule. When you apply a new patch, use a new area of skin. Wait at least 1 week before using the same area again. Talk to your pediatrician regarding the use of this medicine in children. Special care may be needed. Overdosage: If you think you have taken too much of this medicine contact a poison control center or emergency room at once. NOTE: This medicine is only for you. Do not share this medicine with others. What if I miss a dose?  If you forget to replace a patch, use it as soon as you can. Only use one patch at a time and do not leave on the skin for longer than directed. If a patch falls off, you can replace it, but keep to your schedule and remove the patch at the right time. What may interact with this medicine? -medicines for asthma -medicines for blood pressure -medicines for mental depression This list may not describe all possible interactions. Give your health care provider a list of all the medicines, herbs,  non-prescription drugs, or dietary supplements you use. Also tell them if you smoke, drink alcohol, or use illegal drugs. Some items may interact with your medicine. What should I watch for while using this medicine? You should begin using the nicotine patch the day you stop smoking. It is okay if you do not succeed at your attempt to quit and have a cigarette. You can still continue your quit attempt and keep using the product as directed. Just throw away your cigarettes and get back to your quit plan. You can keep the patch in place during swimming, bathing, and showering. If your patch falls off during these activities, replace it. When you first apply the patch, your skin may itch or burn. This should go away soon. When you remove a patch, the skin may look red, but this should only last for a few days. Call your doctor or health care professional if skin redness does not go away after 4 days, if your skin swells, or if you get a rash. If you are a diabetic and you quit smoking, the effects of insulin may be increased and you may need to reduce your insulin dose. Check with your doctor or health care professional about how you should adjust your insulin dose. If you are going to have a magnetic resonance imaging (MRI) procedure, tell your MRI technician if you have this patch on your body. It must be removed before a MRI. What side effects may I notice from receiving this medicine? Side effects that you should report to your doctor or health care professional as soon as possible: -allergic reactions like skin rash, itching or hives, swelling of the face, lips, or tongue -breathing problems -changes in hearing -changes in vision -chest pain -cold sweats -confusion -fast, irregular heartbeat -feeling faint or lightheaded, falls -headache -increased saliva -skin redness that lasts more than 4 days -stomach pain -signs and symptoms of nicotine overdose like nausea; vomiting; dizziness;  weakness; and rapid heartbeat Side effects that usually do not require medical attention (report to your doctor or health care professional if they continue or are bothersome): -diarrhea -dry mouth -hiccups -irritability -nervousness or restlessness -trouble sleeping or vivid dreams This list may not describe all possible side effects. Call your doctor for medical advice about side effects. You may report side effects to FDA at 1-800-FDA-1088. Where should I keep my medicine? Keep out of the reach of children. Store at room temperature between 20 and 25 degrees C (68 and 77 degrees F). Protect from heat and light. Store in International aid/development worker until ready to use. Throw away unused medicine after the expiration date. When you remove a patch, fold with sticky sides together; put in an empty opened pouch and throw away. NOTE: This sheet is a summary. It may not cover all possible information. If you have questions about this medicine, talk to your doctor, pharmacist, or health care provider.  2018 Elsevier/Gold Standard (2013-11-16 15:46:21)  Sumner Boast, PA-C  02/26/2017 8:53 AM    [facility] [address], [place], [place]  [address] Phone: [phone]; Fax: [phone]

## 2017-02-26 ENCOUNTER — Ambulatory Visit (INDEPENDENT_AMBULATORY_CARE_PROVIDER_SITE_OTHER): Payer: BLUE CROSS/BLUE SHIELD | Admitting: Physician Assistant

## 2017-02-26 ENCOUNTER — Encounter: Payer: Self-pay | Admitting: Physician Assistant

## 2017-02-26 VITALS — BP 148/98 | HR 74 | Ht 71.0 in | Wt 207.5 lb

## 2017-02-26 DIAGNOSIS — Z951 Presence of aortocoronary bypass graft: Secondary | ICD-10-CM | POA: Diagnosis not present

## 2017-02-26 DIAGNOSIS — E785 Hyperlipidemia, unspecified: Secondary | ICD-10-CM

## 2017-02-26 DIAGNOSIS — I10 Essential (primary) hypertension: Secondary | ICD-10-CM | POA: Diagnosis not present

## 2017-02-26 DIAGNOSIS — Z720 Tobacco use: Secondary | ICD-10-CM | POA: Diagnosis not present

## 2017-02-26 DIAGNOSIS — F514 Sleep terrors [night terrors]: Secondary | ICD-10-CM

## 2017-02-26 LAB — COMPREHENSIVE METABOLIC PANEL
ALT: 28 IU/L (ref 0–44)
AST: 26 IU/L (ref 0–40)
Albumin/Globulin Ratio: 1.7 (ref 1.2–2.2)
Albumin: 4.2 g/dL (ref 3.5–5.5)
Alkaline Phosphatase: 70 IU/L (ref 39–117)
BUN/Creatinine Ratio: 15 (ref 9–20)
BUN: 15 mg/dL (ref 6–24)
Bilirubin Total: 0.3 mg/dL (ref 0.0–1.2)
CO2: 24 mmol/L (ref 20–29)
Calcium: 9.5 mg/dL (ref 8.7–10.2)
Chloride: 104 mmol/L (ref 96–106)
Creatinine, Ser: 1.02 mg/dL (ref 0.76–1.27)
GFR calc Af Amer: 97 mL/min/{1.73_m2} (ref >59)
GFR calc non Af Amer: 84 mL/min/{1.73_m2} (ref >59)
Globulin, Total: 2.5 g/dL (ref 1.5–4.5)
Glucose: 94 mg/dL (ref 65–99)
Potassium: 4.7 mmol/L (ref 3.5–5.2)
Sodium: 143 mmol/L (ref 134–144)
Total Protein: 6.7 g/dL (ref 6.0–8.5)

## 2017-02-26 LAB — CBC WITH DIFFERENTIAL/PLATELET
Basophils Absolute: 0 10*3/uL (ref 0.0–0.2)
Basos: 1 %
EOS (ABSOLUTE): 0.3 10*3/uL (ref 0.0–0.4)
Eos: 5 %
Hematocrit: 41 % (ref 37.5–51.0)
Hemoglobin: 14.4 g/dL (ref 13.0–17.7)
Immature Grans (Abs): 0 10*3/uL (ref 0.0–0.1)
Immature Granulocytes: 0 %
Lymphocytes Absolute: 1.7 10*3/uL (ref 0.7–3.1)
Lymphs: 28 %
MCH: 31.2 pg (ref 26.6–33.0)
MCHC: 35.1 g/dL (ref 31.5–35.7)
MCV: 89 fL (ref 79–97)
Monocytes Absolute: 0.5 10*3/uL (ref 0.1–0.9)
Monocytes: 8 %
Neutrophils Absolute: 3.5 10*3/uL (ref 1.4–7.0)
Neutrophils: 58 %
Platelets: 220 10*3/uL (ref 150–379)
RBC: 4.62 x10E6/uL (ref 4.14–5.80)
RDW: 12.6 % (ref 12.3–15.4)
WBC: 6 10*3/uL (ref 3.4–10.8)

## 2017-02-26 LAB — LIPID PANEL
Chol/HDL Ratio: 3 ratio (ref 0.0–5.0)
Cholesterol, Total: 137 mg/dL (ref 100–199)
HDL: 46 mg/dL (ref >39)
LDL Calculated: 73 mg/dL (ref 0–99)
Triglycerides: 92 mg/dL (ref 0–149)
VLDL Cholesterol Cal: 18 mg/dL (ref 5–40)

## 2017-02-26 MED ORDER — ASPIRIN EC 81 MG PO TBEC: 81.0000 mg | DELAYED_RELEASE_TABLET | Freq: Every day | ORAL | Status: AC

## 2017-02-26 MED ORDER — NICOTINE 21 MG/24HR TD PT24: 21.0000 mg | MEDICATED_PATCH | Freq: Every day | TRANSDERMAL | 0 refills | Status: DC

## 2017-02-26 MED ORDER — HYDROCHLOROTHIAZIDE 25 MG PO TABS: 25.0000 mg | ORAL_TABLET | Freq: Every day | ORAL | 3 refills | Status: DC

## 2017-02-26 MED ORDER — METOPROLOL SUCCINATE ER 50 MG PO TB24: ORAL_TABLET | ORAL | 3 refills | Status: DC

## 2017-02-26 NOTE — Patient Instructions (Addendum)
Medication Instructions:  Your physician has recommended you make the following change in your medication:  1-START Hydroclorothizide 25 mg by mouth daily 2-START Nicotine patch 3-Decrease Aspirin 81 mg by mouth daily   Labwork: Your physician recommends that you have lab work today- fasting lipid panel, CMET, and CBC  Testing/Procedures: NONE  Follow-Up: Your physician wants you to follow-up in: 12 months with Dr. [name]. You will receive a reminder letter in the mail two months in advance. If you don't receive a letter, please call our office to schedule the follow-up appointment.   If you need a refill on your cardiac medications before your next appointment, please call your pharmacy.    Low-Sodium Eating Plan Sodium, which is an element that makes up salt, helps you maintain a healthy balance of fluids in your body. Too much sodium can increase your blood pressure and cause fluid and waste to be held in your body. Your health care provider or dietitian may recommend following this plan if you have high blood pressure (hypertension), kidney disease, liver disease, or heart failure. Eating less sodium can help lower your blood pressure, reduce swelling, and protect your heart, liver, and kidneys. What are tips for following this plan? General guidelines  Most people on this plan should limit their sodium intake to 1,500-2,000 mg (milligrams) of sodium each day. Reading food labels  The Nutrition Facts label lists the amount of sodium in one serving of the food. If you eat more than one serving, you must multiply the listed amount of sodium by the number of servings.  Choose foods with less than 140 mg of sodium per serving.  Avoid foods with 300 mg of sodium or more per serving. Shopping  Look for lower-sodium products, often labeled as "low-sodium" or "no salt added."  Always check the sodium content even if foods are labeled as "unsalted" or "no salt added".  Buy fresh  foods. ? Avoid canned foods and premade or frozen meals. ? Avoid canned, cured, or processed meats  Buy breads that have less than 80 mg of sodium per slice. Cooking  Eat more home-cooked food and less restaurant, buffet, and fast food.  Avoid adding salt when cooking. Use salt-free seasonings or herbs instead of table salt or sea salt. Check with your health care provider or pharmacist before using salt substitutes.  Cook with plant-based oils, such as canola, sunflower, or olive oil. Meal planning  When eating at a restaurant, ask that your food be prepared with less salt or no salt, if possible.  Avoid foods that contain MSG (monosodium glutamate). MSG is sometimes added to [place] food, bouillon, and some canned foods. What foods are recommended? The items listed may not be a complete list. Talk with your dietitian about what dietary choices are best for you. Grains Low-sodium cereals, including oats, puffed wheat and rice, and shredded wheat. Low-sodium crackers. Unsalted rice. Unsalted pasta. Low-sodium bread. Whole-grain breads and whole-grain pasta. Vegetables Fresh or frozen vegetables. "No salt added" canned vegetables. "No salt added" tomato sauce and paste. Low-sodium or reduced-sodium tomato and vegetable juice. Fruits Fresh, frozen, or canned fruit. Fruit juice. Meats and other protein foods Fresh or frozen (no salt added) meat, poultry, seafood, and fish. Low-sodium canned tuna and salmon. Unsalted nuts. Dried peas, beans, and lentils without added salt. Unsalted canned beans. Eggs. Unsalted nut butters. Dairy Milk. Soy milk. Cheese that is naturally low in sodium, such as ricotta cheese, fresh mozzarella, or Swiss cheese Low-sodium or reduced-sodium cheese.  Cream cheese. Yogurt. Fats and oils Unsalted butter. Unsalted margarine with no trans fat. Vegetable oils such as canola or olive oils. Seasonings and other foods Fresh and dried herbs and spices. Salt-free  seasonings. Low-sodium mustard and ketchup. Sodium-free salad dressing. Sodium-free light mayonnaise. Fresh or refrigerated horseradish. Lemon juice. Vinegar. Homemade, reduced-sodium, or low-sodium soups. Unsalted popcorn and pretzels. Low-salt or salt-free chips. What foods are not recommended? The items listed may not be a complete list. Talk with your dietitian about what dietary choices are best for you. Grains Instant hot cereals. Bread stuffing, pancake, and biscuit mixes. Croutons. Seasoned rice or pasta mixes. Noodle soup cups. Boxed or frozen macaroni and cheese. Regular salted crackers. Self-rising flour. Vegetables Sauerkraut, pickled vegetables, and relishes. Olives. [place] fries. Onion rings. Regular canned vegetables (not low-sodium or reduced-sodium). Regular canned tomato sauce and paste (not low-sodium or reduced-sodium). Regular tomato and vegetable juice (not low-sodium or reduced-sodium). Frozen vegetables in sauces. Meats and other protein foods Meat or fish that is salted, canned, smoked, spiced, or pickled. Bacon, ham, sausage, hotdogs, corned beef, chipped beef, packaged lunch meats, salt pork, jerky, pickled herring, anchovies, regular canned tuna, sardines, salted nuts. Dairy Processed cheese and cheese spreads. Cheese curds. Blue cheese. Feta cheese. String cheese. Regular cottage cheese. Buttermilk. Canned milk. Fats and oils Salted butter. Regular margarine. Ghee. Bacon fat. Seasonings and other foods Onion salt, garlic salt, seasoned salt, table salt, and sea salt. Canned and packaged gravies. Worcestershire sauce. Tartar sauce. Barbecue sauce. Teriyaki sauce. Soy sauce, including reduced-sodium. Steak sauce. Fish sauce. Oyster sauce. Cocktail sauce. Horseradish that you find on the shelf. Regular ketchup and mustard. Meat flavorings and tenderizers. Bouillon cubes. Hot sauce and Tabasco sauce. Premade or packaged marinades. Premade or packaged taco seasonings. Relishes.  Regular salad dressings. Salsa. Potato and tortilla chips. Corn chips and puffs. Salted popcorn and pretzels. Canned or dried soups. Pizza. Frozen entrees and pot pies. Summary  Eating less sodium can help lower your blood pressure, reduce swelling, and protect your heart, liver, and kidneys.  Most people on this plan should limit their sodium intake to 1,500-2,000 mg (milligrams) of sodium each day.  Canned, boxed, and frozen foods are high in sodium. Restaurant foods, fast foods, and pizza are also very high in sodium. You also get sodium by adding salt to food.  Try to cook at home, eat more fresh fruits and vegetables, and eat less fast food, canned, processed, or prepared foods. This information is not intended to replace advice given to you by your health care provider. Make sure you discuss any questions you have with your health care provider. Document Released: 06/09/2001 Document Revised: 12/12/2015 Document Reviewed: 12/12/2015 Elsevier Interactive Patient Education  2018 [employer]. Hydrochlorothiazide, HCTZ capsules or tablets What is this medicine? HYDROCHLOROTHIAZIDE (hye droe klor oh THYE a zide) is a diuretic. It increases the amount of urine passed, which causes the body to lose salt and water. This medicine is used to treat high blood pressure. It is also reduces the swelling and water retention caused by various medical conditions, such as heart, liver, or kidney disease. This medicine may be used for other purposes; ask your health care provider or pharmacist if you have questions. COMMON BRAND NAME(S): Esidrix, Ezide, HydroDIURIL, Microzide, Oretic, Zide What should I tell my health care provider before I take this medicine? They need to know if you have any of these conditions: -diabetes -gout -immune system problems, like lupus -kidney disease or kidney stones -liver disease -pancreatitis -  small amount of urine or difficulty passing urine -an unusual or allergic  reaction to hydrochlorothiazide, sulfa drugs, other medicines, foods, dyes, or preservatives -pregnant or trying to get pregnant -breast-feeding How should I use this medicine? Take this medicine by mouth with a glass of water. Follow the directions on the prescription label. Take your medicine at regular intervals. Remember that you will need to pass urine frequently after taking this medicine. Do not take your doses at a time of day that will cause you problems. Do not stop taking your medicine unless your doctor tells you to. Talk to your pediatrician regarding the use of this medicine in children. Special care may be needed. Overdosage: If you think you have taken too much of this medicine contact a poison control center or emergency room at once. NOTE: This medicine is only for you. Do not share this medicine with others. What if I miss a dose? If you miss a dose, take it as soon as you can. If it is almost time for your next dose, take only that dose. Do not take double or extra doses. What may interact with this medicine? -cholestyramine -colestipol -digoxin -dofetilide -lithium -medicines for blood pressure -medicines for diabetes -medicines that relax muscles for surgery -other diuretics -steroid medicines like prednisone or cortisone This list may not describe all possible interactions. Give your health care provider a list of all the medicines, herbs, non-prescription drugs, or dietary supplements you use. Also tell them if you smoke, drink alcohol, or use illegal drugs. Some items may interact with your medicine. What should I watch for while using this medicine? Visit your doctor or health care professional for regular checks on your progress. Check your blood pressure as directed. Ask your doctor or health care professional what your blood pressure should be and when you should contact him or her. You may need to be on a special diet while taking this medicine. Ask your  doctor. Check with your doctor or health care professional if you get an attack of severe diarrhea, nausea and vomiting, or if you sweat a lot. The loss of too much body fluid can make it dangerous for you to take this medicine. You may get drowsy or dizzy. Do not drive, use machinery, or do anything that needs mental alertness until you know how this medicine affects you. Do not stand or sit up quickly, especially if you are an older patient. This reduces the risk of dizzy or fainting spells. Alcohol may interfere with the effect of this medicine. Avoid alcoholic drinks. This medicine may affect your blood sugar level. If you have diabetes, check with your doctor or health care professional before changing the dose of your diabetic medicine. This medicine can make you more sensitive to the sun. Keep out of the sun. If you cannot avoid being in the sun, wear protective clothing and use sunscreen. Do not use sun lamps or tanning beds/booths. What side effects may I notice from receiving this medicine? Side effects that you should report to your doctor or health care professional as soon as possible: -allergic reactions such as skin rash or itching, hives, swelling of the lips, mouth, tongue, or throat -changes in vision -chest pain -eye pain -fast or irregular heartbeat -feeling faint or lightheaded, falls -gout attack -muscle pain or cramps -pain or difficulty when passing urine -pain, tingling, numbness in the hands or feet -redness, blistering, peeling or loosening of the skin, including inside the mouth -unusually weak or tired Side  effects that usually do not require medical attention (report to your doctor or health care professional if they continue or are bothersome): -change in sex drive or performance -dry mouth -headache -stomach upset This list may not describe all possible side effects. Call your doctor for medical advice about side effects. You may report side effects to FDA at  1-800-FDA-1088. Where should I keep my medicine? Keep out of the reach of children. Store at room temperature between 15 and 30 degrees C (59 and 86 degrees F). Do not freeze. Protect from light and moisture. Keep container closed tightly. Throw away any unused medicine after the expiration date. NOTE: This sheet is a summary. It may not cover all possible information. If you have questions about this medicine, talk to your doctor, pharmacist, or health care provider.  2018 Elsevier/Gold Standard (2009-08-12 12:57:37) Nicotine skin patches What is this medicine? NICOTINE ([place] teen) helps people stop smoking. The patches replace the nicotine found in cigarettes and help to decrease withdrawal effects. They are most effective when used in combination with a stop-smoking program. This medicine may be used for other purposes; ask your health care provider or pharmacist if you have questions. COMMON BRAND NAME(S): Habitrol, Nicoderm CQ, Nicotrol What should I tell my health care provider before I take this medicine? They need to know if you have any of these conditions: -diabetes -heart disease, angina, irregular heartbeat or previous heart attack -high blood pressure -lung disease, including asthma -overactive thyroid -pheochromocytoma -seizures or a history of seizures -skin problems, like eczema -stomach problems or ulcers -an unusual or allergic reaction to nicotine, adhesives, other medicines, foods, dyes, or preservatives -pregnant or trying to get pregnant -breast-feeding How should I use this medicine? This medicine is for use on the skin. Follow the directions that come with the patches. Find an area of skin on your upper arm, chest, or back that is clean, dry, greaseless, undamaged and hairless. Wash hands with plain soap and water. Do not use anything that contains aloe, lanolin or glycerin as these may prevent the patch from sticking. Dry thoroughly. Remove the patch from the  sealed pouch. Do not try to cut or trim the patch. Using your palm, press the patch firmly in place for 10 seconds to make sure that there is good contact with your skin. After applying the patch, wash your hands. Change the patch every day, keeping to a regular schedule. When you apply a new patch, use a new area of skin. Wait at least 1 week before using the same area again. Talk to your pediatrician regarding the use of this medicine in children. Special care may be needed. Overdosage: If you think you have taken too much of this medicine contact a poison control center or emergency room at once. NOTE: This medicine is only for you. Do not share this medicine with others. What if I miss a dose? If you forget to replace a patch, use it as soon as you can. Only use one patch at a time and do not leave on the skin for longer than directed. If a patch falls off, you can replace it, but keep to your schedule and remove the patch at the right time. What may interact with this medicine? -medicines for asthma -medicines for blood pressure -medicines for mental depression This list may not describe all possible interactions. Give your health care provider a list of all the medicines, herbs, non-prescription drugs, or dietary supplements you use. Also tell them  if you smoke, drink alcohol, or use illegal drugs. Some items may interact with your medicine. What should I watch for while using this medicine? You should begin using the nicotine patch the day you stop smoking. It is okay if you do not succeed at your attempt to quit and have a cigarette. You can still continue your quit attempt and keep using the product as directed. Just throw away your cigarettes and get back to your quit plan. You can keep the patch in place during swimming, bathing, and showering. If your patch falls off during these activities, replace it. When you first apply the patch, your skin may itch or burn. This should go away soon.  When you remove a patch, the skin may look red, but this should only last for a few days. Call your doctor or health care professional if skin redness does not go away after 4 days, if your skin swells, or if you get a rash. If you are a diabetic and you quit smoking, the effects of insulin may be increased and you may need to reduce your insulin dose. Check with your doctor or health care professional about how you should adjust your insulin dose. If you are going to have a magnetic resonance imaging (MRI) procedure, tell your MRI technician if you have this patch on your body. It must be removed before a MRI. What side effects may I notice from receiving this medicine? Side effects that you should report to your doctor or health care professional as soon as possible: -allergic reactions like skin rash, itching or hives, swelling of the face, lips, or tongue -breathing problems -changes in hearing -changes in vision -chest pain -cold sweats -confusion -fast, irregular heartbeat -feeling faint or lightheaded, falls -headache -increased saliva -skin redness that lasts more than 4 days -stomach pain -signs and symptoms of nicotine overdose like nausea; vomiting; dizziness; weakness; and rapid heartbeat Side effects that usually do not require medical attention (report to your doctor or health care professional if they continue or are bothersome): -diarrhea -dry mouth -hiccups -irritability -nervousness or restlessness -trouble sleeping or vivid dreams This list may not describe all possible side effects. Call your doctor for medical advice about side effects. You may report side effects to FDA at 1-800-FDA-1088. Where should I keep my medicine? Keep out of the reach of children. Store at room temperature between 20 and 25 degrees C (68 and 77 degrees F). Protect from heat and light. Store in International aid/development worker until ready to use. Throw away unused medicine after the expiration date.  When you remove a patch, fold with sticky sides together; put in an empty opened pouch and throw away. NOTE: This sheet is a summary. It may not cover all possible information. If you have questions about this medicine, talk to your doctor, pharmacist, or health care provider.  2018 Elsevier/Gold Standard (2013-11-16 15:46:21)

## 2017-03-08 ENCOUNTER — Encounter: Payer: BLUE CROSS/BLUE SHIELD | Admitting: Family Medicine

## 2017-03-20 ENCOUNTER — Ambulatory Visit (INDEPENDENT_AMBULATORY_CARE_PROVIDER_SITE_OTHER): Payer: BLUE CROSS/BLUE SHIELD | Admitting: Family Medicine

## 2017-03-20 ENCOUNTER — Encounter: Payer: Self-pay | Admitting: Family Medicine

## 2017-03-20 VITALS — BP 124/84 | HR 68 | Temp 98.2°F | Ht 71.5 in | Wt 204.6 lb

## 2017-03-20 DIAGNOSIS — Z0000 Encounter for general adult medical examination without abnormal findings: Secondary | ICD-10-CM | POA: Diagnosis not present

## 2017-03-20 LAB — PSA: PSA: 0.55 ng/mL (ref 0.10–4.00)

## 2017-03-20 MED ORDER — SILDENAFIL CITRATE 20 MG PO TABS: ORAL_TABLET | ORAL | 1 refills | Status: DC

## 2017-03-20 NOTE — Progress Notes (Signed)
Subjective:     Patient ID: [name] [name], male   DOB: [birth year], 52 y.o.   MRN: [id number]  HPI Patient seen for physical exam. He has history of hypertension, hyperlipidemia, ongoing nicotine use, CAD with prior bypass. He smokes currently less than one pack per day but hopes to quit soon. He has prescription already for nicotine patches.  Colonoscopy up-to-date. No history of shingles vaccine. He had recent labs through cardiology including CBC, comprehensive medical panel, and Lipitor. These were reviewed. No recent PSA. Tetanus up-to-date  He works in [employer]. He is thinking about moving to [facility] later this year. No regular alcohol use.  He has history of military service for 10 years has had some intermittent posttraumatic stress issues. He plans to see someone with the [facility] soon to discuss this.  Past Medical History:  Diagnosis Date  . Acute bronchitis 03/31/2009  . ANXIETY 11/10/2008  . Arthritis   . Coronary artery disease   . EPIGASTRIC PAIN 12/08/2009  . GERD (gastroesophageal reflux disease)   . 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 Other and unspecified hyperlipidemia 12/29/2008   Past Surgical History:  Procedure Laterality Date  . CARDIAC CATHETERIZATION N/A 08/11/2015   Procedure: Left Heart Cath and Coronary Angiography;  Surgeon: Nelva Bush, MD;  Location: [facility];  Service: Cardiovascular;  Laterality: N/A;  . COLONOSCOPY    . CORONARY ARTERY BYPASS GRAFT N/A 08/17/2015   Procedure: CORONARY ARTERY BYPASS GRAFTING (CABG) times five, LIMA to LAD, SVG to Diagonal sequentially SVG to OM-PL and Left Radial Artery to PD;  Surgeon: Melrose Nakayama, MD;  Location: [facility];  Service: Open Heart Surgery;  Laterality: N/A;  . RADIAL ARTERY HARVEST Left 08/17/2015   Procedure: RADIAL ARTERY HARVEST;  Surgeon: Melrose Nakayama, MD;  Location: [facility];  Service: Open Heart Surgery;  Laterality: Left;  . TEE WITHOUT CARDIOVERSION N/A  08/17/2015   Procedure: TRANSESOPHAGEAL ECHOCARDIOGRAM (TEE);  Surgeon: Melrose Nakayama, MD;  Location: [place];  Service: Open Heart Surgery;  Laterality: N/A;  . [place]    reports that he quit smoking about 19 months ago. His smoking use included cigarettes. He has a 8.00 pack-year smoking history. he has never used smokeless tobacco. He reports that he drinks about 1.8 oz of alcohol per week. He reports that he does not use drugs. family history includes Cancer in his mother; Diabetes in his brother; Heart disease in his paternal aunt and paternal uncle; Heart disease (age of onset: 20) in his father. No Known Allergies   Review of Systems  Constitutional: Negative for activity change, appetite change, fatigue and fever.  HENT: Negative for congestion, ear pain and trouble swallowing.   Eyes: Negative for pain and visual disturbance.  Respiratory: Negative for cough, shortness of breath and wheezing.   Cardiovascular: Negative for chest pain and palpitations.  Gastrointestinal: Negative for abdominal distention, abdominal pain, blood in stool, constipation, diarrhea, nausea, rectal pain and vomiting.  Genitourinary: Negative for dysuria, hematuria and testicular pain.  Musculoskeletal: Negative for arthralgias and joint swelling.  Skin: Negative for rash.  Neurological: Negative for dizziness, syncope and headaches.  Hematological: Negative for adenopathy.  Psychiatric/Behavioral: Negative for confusion and dysphoric mood.       Objective:   Physical Exam  Constitutional: He is oriented to person, place, and time. He appears well-developed and well-nourished. No distress.  HENT:  Head: Normocephalic and atraumatic.  Right Ear: External ear normal.  Left Ear: External ear normal.  Mouth/Throat: Oropharynx is clear and moist.  Eyes: Conjunctivae and EOM are normal. Pupils are equal, round, and reactive to light.  Neck: Normal range of motion. Neck supple.  No thyromegaly present.  Cardiovascular: Normal rate, regular rhythm and normal heart sounds.  No murmur heard. Pulmonary/Chest: No respiratory distress. He has no wheezes. He has no rales.  Abdominal: Soft. Bowel sounds are normal. He exhibits no distension and no mass. There is no tenderness. There is no rebound and no guarding.  Musculoskeletal: He exhibits no edema.  Lymphadenopathy:    He has no cervical adenopathy.  Neurological: He is alert and oriented to person, place, and time. He displays normal reflexes. No cranial nerve deficit.  Skin: No rash noted.  Psychiatric: He has a normal mood and affect.       Assessment:     Physical exam. Several issues addressed as below    Plan:     -Strongly advocate nicotine cessation. He already has prescription for nicotine patch which he plans to start -The natural history of prostate cancer and ongoing controversy regarding screening and potential treatment outcomes of prostate cancer has been discussed with the patient. The meaning of a false positive PSA and a false negative PSA has been discussed. He indicates understanding of the limitations of this screening test and wishes to proceed with screening PSA testing. -Check on coverage for shingles vaccine and let us know if interested -[facility] is [facility] -Colonoscopy is up-to-date  [name] MD [facility]

## 2017-03-20 NOTE — Patient Instructions (Signed)
Consider new shingles vaccine (Shingrix).   

## 2017-03-27 ENCOUNTER — Other Ambulatory Visit: Payer: Self-pay | Admitting: Family Medicine

## 2017-03-27 NOTE — Telephone Encounter (Signed)
Refill for 6 months. 

## 2017-03-27 NOTE — Telephone Encounter (Signed)
Last refill 08/29/16 and last office visit 03/20/17.  Okay to refill?

## 2017-05-13 ENCOUNTER — Other Ambulatory Visit: Payer: Self-pay | Admitting: Family Medicine

## 2017-05-13 IMAGING — CR DG CHEST 1V PORT
1 series · 1 of 1 positions shown · non-contrast
Comparison: 08/17/2015

CLINICAL DATA: Followup CABG

EXAM:
PORTABLE CHEST 1 VIEW

[AP]
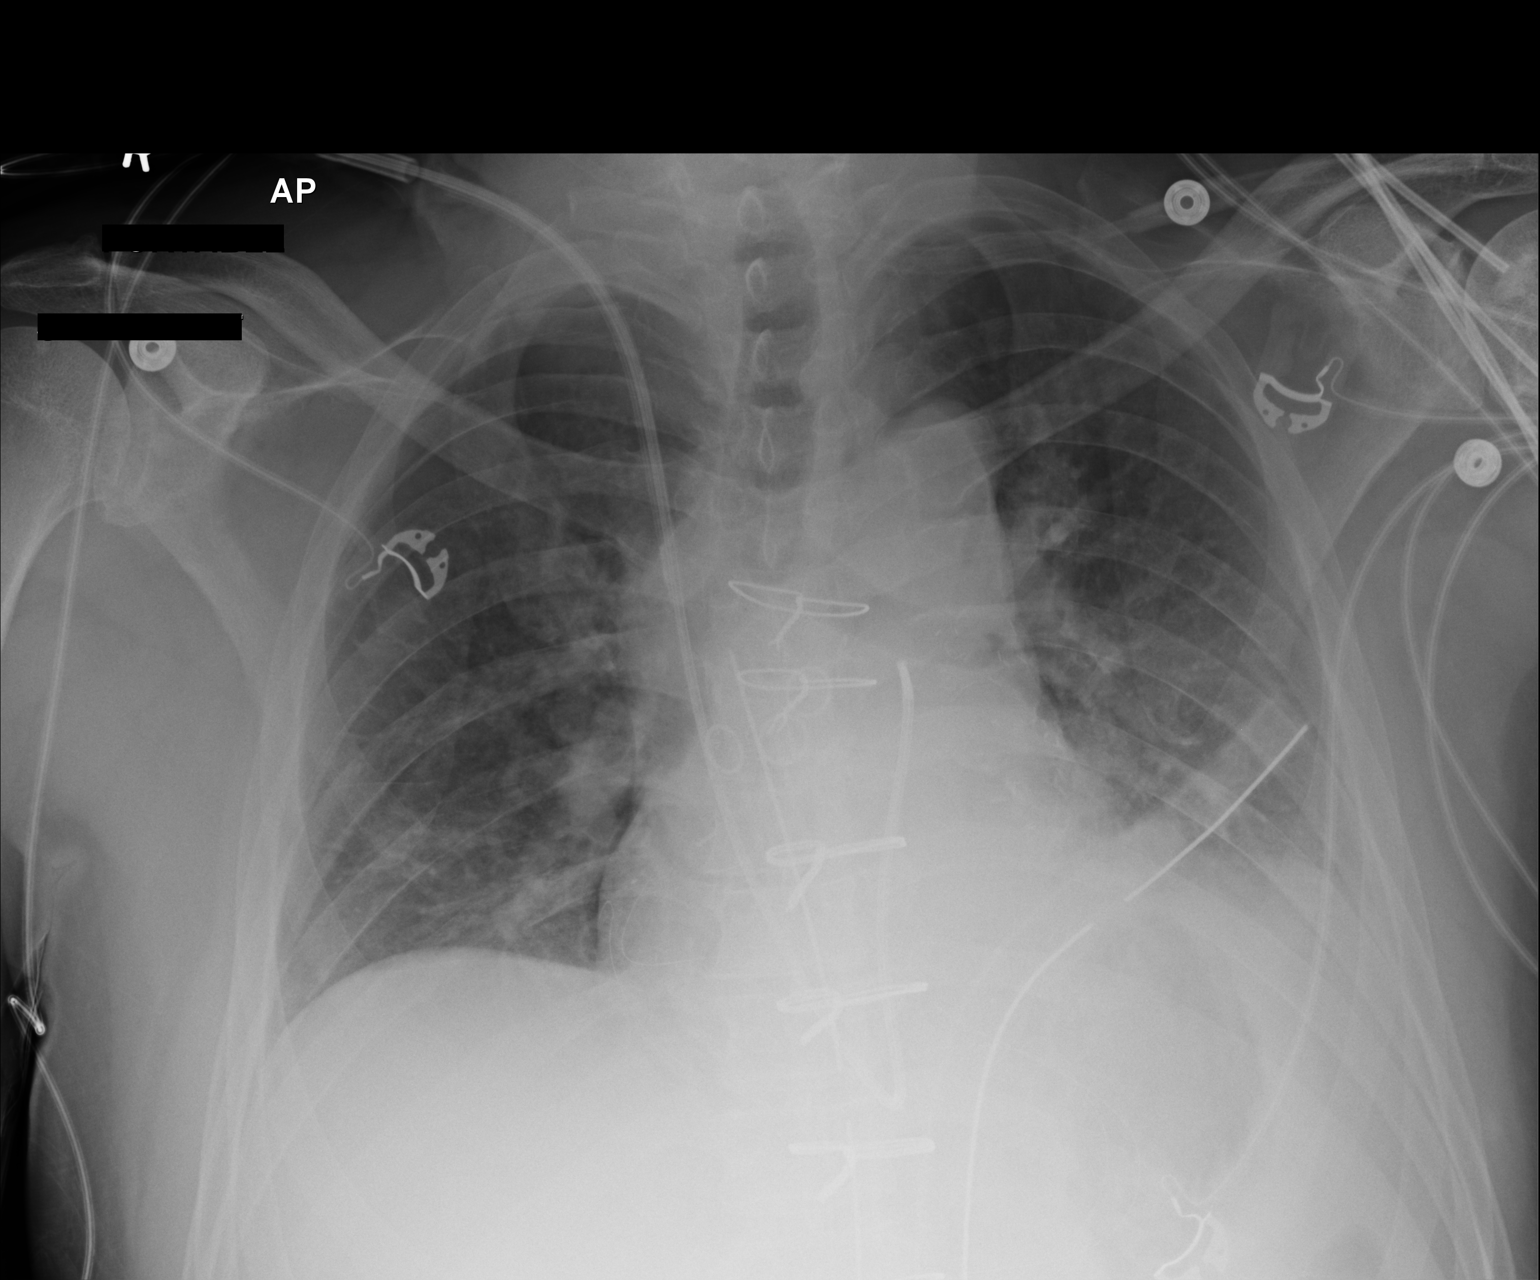

[1 of 1 positions shown; findings below may reference images not displayed]

FINDINGS: Endotracheal tube and nasogastric tube have been removed. Left chest
tube remains in place. No pneumothorax. Swan-Ganz catheter remains
in place with tip in the proximal left pulmonary artery. Mediastinal
drain remains in place. There is worsened atelectasis in the lower
lobes, left more than right.
IMPRESSION: Endotracheal tube and nasogastric tube removed. Worsened volume loss
in the lower lobes left more than right. No pneumothorax.

## 2017-08-03 IMAGING — NM NM MISC PROCEDURE
3 series · 18 of 18 positions shown · non-contrast
Comparison: none

[Series 1: wbr_r-proj_st rest_(id)_sa · 6.5mm · 6.51mm/px · 6 of 64 frames shown]
[frame 6/64]
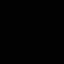
[frame 16/64]
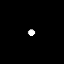
[frame 27/64]
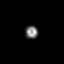
[frame 38/64]
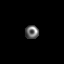
[frame 48/64]
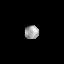
[frame 59/64]
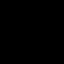

[Series 1: wbr_s-proj_st stress_(id)_sa · 6.5mm · 6.51mm/px · 6 of 512 frames shown (1 of 2)]
[frame 43/512]
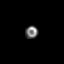
[frame 128/512]
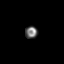
[frame 214/512]
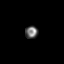
[frame 299/512]
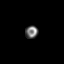
[frame 384/512]
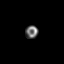
[frame 470/512]
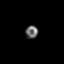

[Series 1: wbr_s-proj_st stress_(id)_sa · 6.5mm · 6.51mm/px · 6 of 64 frames shown (2 of 2)]
[frame 6/64]
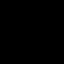
[frame 16/64]
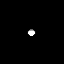
[frame 27/64]
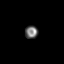
[frame 38/64]
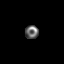
[frame 48/64]
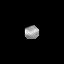
[frame 59/64]
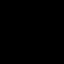

[18 of 18 positions shown; findings below may reference images not displayed]

Canned report from images found in remote index.

Refer to host system for actual result text.

## 2017-08-04 ENCOUNTER — Other Ambulatory Visit: Payer: Self-pay | Admitting: Family Medicine

## 2017-10-11 ENCOUNTER — Other Ambulatory Visit: Payer: Self-pay | Admitting: Family Medicine

## 2017-10-14 ENCOUNTER — Other Ambulatory Visit: Payer: Self-pay | Admitting: Family Medicine

## 2017-10-14 MED ORDER — CLONAZEPAM 1 MG PO TABS: 1.0000 mg | ORAL_TABLET | Freq: Two times a day (BID) | ORAL | 5 refills | Status: DC

## 2017-10-14 NOTE — Telephone Encounter (Signed)
Last OV 03/20/17, No future OV  Last filled 03/27/17, # 60 with 5 refills.

## 2017-10-18 ENCOUNTER — Telehealth: Payer: Self-pay | Admitting: Family Medicine

## 2017-10-18 NOTE — Telephone Encounter (Signed)
Copied from [place] [id number]. Topic: General - Other >> Oct 18, 2017  2:53 PM Keene Breath wrote: Reason for CRM: Patient called to inform office that his medication was sent to the incorrect pharmacy.  Patient would like that corrected and have his medication, clonazePAM (KLONOPIN) 1 MG tablet, sent to [id number] - [place], [place] [phone] (Phone) [phone] (Fax).  Please advise and let patient know when he can pick up this prescription.  CB# [phone].

## 2017-10-20 MED ORDER — CLONAZEPAM 1 MG PO TABS: 1.0000 mg | ORAL_TABLET | Freq: Two times a day (BID) | ORAL | 5 refills | Status: DC

## 2017-10-20 NOTE — Telephone Encounter (Signed)
Refills sent for 6 months.

## 2017-10-30 ENCOUNTER — Other Ambulatory Visit: Payer: Self-pay | Admitting: Family Medicine

## 2017-11-01 ENCOUNTER — Other Ambulatory Visit: Payer: Self-pay | Admitting: Family Medicine

## 2018-02-02 ENCOUNTER — Other Ambulatory Visit: Payer: Self-pay | Admitting: Family Medicine

## 2018-03-03 ENCOUNTER — Telehealth: Payer: Self-pay | Admitting: Family Medicine

## 2018-03-03 NOTE — Telephone Encounter (Signed)
Please advise 

## 2018-03-03 NOTE — Telephone Encounter (Signed)
Copied from [place] [phone] [id number]. Topic: Quick Communication - Rx Refill/Question >> Mar 03, 2018 12:51 PM Margot Ables wrote: Medication: nitroGLYCERIN (NITROSTAT) 0.4 MG SL tablet - pt has had medication since 2017 as a preventative - he has not used it but has lost the bottle - wife states cardiology released pt and told him to refill thru PCP  Has the patient contacted their pharmacy? Yes - needs new RX Preferred Pharmacy (with phone number or street name): [id number] [place] [place] [address] [phone] (Phone) [phone] (Fax)

## 2018-03-03 NOTE — Telephone Encounter (Signed)
OK to refill

## 2018-03-04 ENCOUNTER — Other Ambulatory Visit: Payer: Self-pay

## 2018-03-04 MED ORDER — NITROGLYCERIN 0.4 MG SL SUBL: 0.1600 mg | SUBLINGUAL_TABLET | SUBLINGUAL | 11 refills | Status: DC | PRN

## 2018-03-04 NOTE — Telephone Encounter (Signed)
Refill has been sent to the pharmacy.  

## 2018-03-14 ENCOUNTER — Other Ambulatory Visit: Payer: Self-pay | Admitting: Physician Assistant

## 2018-03-25 ENCOUNTER — Encounter: Payer: BLUE CROSS/BLUE SHIELD | Admitting: Family Medicine

## 2018-04-08 ENCOUNTER — Other Ambulatory Visit: Payer: Self-pay | Admitting: Physician Assistant

## 2018-04-24 ENCOUNTER — Other Ambulatory Visit: Payer: Self-pay | Admitting: Family Medicine

## 2018-04-24 ENCOUNTER — Telehealth: Payer: Self-pay | Admitting: Cardiology

## 2018-04-24 NOTE — Telephone Encounter (Signed)
New message     Virtual video appt made on 04-30-18.  Pt gave consent for virtual visit.  YOUR CARDIOLOGY TEAM HAS ARRANGED FOR AN E-VISIT FOR YOUR APPOINTMENT - PLEASE REVIEW IMPORTANT INFORMATION BELOW SEVERAL DAYS PRIOR TO YOUR APPOINTMENT  Due to the recent COVID-19 pandemic, we are transitioning in-person office visits to tele-medicine visits in an effort to decrease unnecessary exposure to our patients, their families, and staff. These visits are billed to your insurance just like a normal visit is. We also encourage you to sign up for MyChart if you have not already done so. You will need a smartphone if possible. For patients that do not have this, we can still complete the visit using a regular telephone but do prefer a smartphone to enable video when possible. You may have a family member that lives with you that can help. If possible, we also ask that you have a blood pressure cuff and scale at home to measure your blood pressure, heart rate and weight prior to your scheduled appointment. Patients with clinical needs that need an in-person evaluation and testing will still be able to come to the office if absolutely necessary. If you have any questions, feel free to call our office.     YOUR PROVIDER WILL BE USING THE FOLLOWING PLATFORM TO COMPLETE YOUR VISIT: Doximity   IF USING MYCHART - How to Download the MyChart App to Your SmartPhone   - If Apple, go to [employer] and type in MyChart in the search bar and download the app. If Android, ask patient to go to [employer] and type in [place] in the search bar and download the app. The app is free but as with any other app downloads, your phone may require you to verify saved payment information or Apple/Android password.  - You will need to then log into the app with your MyChart username and password, and select [facility] as your healthcare provider to link the account.  - When it is time for your visit, go to the MyChart app,  find appointments, and click Begin Video Visit. Be sure to Select Allow for your device to access the Microphone and Camera for your visit. You will then be connected, and your provider will be with you shortly.  **If you have any issues connecting or need assistance, please contact MyChart service desk [phone])**  **If using a computer, in order to ensure the best quality for your visit, you will need to use either of the following Internet Browsers: Insurance underwriter or Microsoft Edge**   IF USING DOXIMITY or [email or web address] - The staff will give you instructions on receiving your link to join the meeting the day of your visit.      2-3 DAYS BEFORE YOUR APPOINTMENT  You will receive a telephone call from one of our [facility] team members - your caller ID may say "Unknown caller." If this is a video visit, we will walk you through how to get the video launched on your phone. We will remind you check your blood pressure, heart rate and weight prior to your scheduled appointment. If you have an Apple Watch or Kardia, please upload any pertinent ECG strips the day before or morning of your appointment to [facility]. Our staff will also make sure you have reviewed the consent and agree to move forward with your scheduled tele-health visit.     THE DAY OF YOUR APPOINTMENT  Approximately 15 minutes prior to your scheduled appointment,  you will receive a telephone call from one of [facility] team - your caller ID may say "Unknown caller."  Our staff will confirm medications, vital signs for the day and any symptoms you may be experiencing. Please have this information available prior to the time of visit start. It may also be helpful for you to have a pad of paper and pen handy for any instructions given during your visit. They will also walk you through joining the smartphone meeting if this is a video visit.    CONSENT FOR TELE-HEALTH VISIT - PLEASE REVIEW  I hereby voluntarily request,  consent and authorize [facility] and its employed or contracted physicians, physician assistants, nurse practitioners or other licensed health care professionals (the Practitioner), to provide me with telemedicine health care services (the Services") as deemed necessary by the treating Practitioner. I acknowledge and consent to receive the Services by the Practitioner via telemedicine. I understand that the telemedicine visit will involve communicating with the Practitioner through live audiovisual communication technology and the disclosure of certain medical information by electronic transmission. I acknowledge that I have been given the opportunity to request an in-person assessment or other available alternative prior to the telemedicine visit and am voluntarily participating in the telemedicine visit.  I understand that I have the right to withhold or withdraw my consent to the use of telemedicine in the course of my care at any time, without affecting my right to future care or treatment, and that the Practitioner or I may terminate the telemedicine visit at any time. I understand that I have the right to inspect all information obtained and/or recorded in the course of the telemedicine visit and may receive copies of available information for a reasonable fee.  I understand that some of the potential risks of receiving the Services via telemedicine include:   Delay or interruption in medical evaluation due to technological equipment failure or disruption;  Information transmitted may not be sufficient (e.g. poor resolution of images) to allow for appropriate medical decision making by the Practitioner; and/or   In rare instances, security protocols could fail, causing a breach of personal health information.  Furthermore, I acknowledge that it is my responsibility to provide information about my medical history, conditions and care that is complete and accurate to the best of my ability. I  acknowledge that Practitioner's advice, recommendations, and/or decision may be based on factors not within their control, such as incomplete or inaccurate data provided by me or distortions of diagnostic images or specimens that may result from electronic transmissions. I understand that the practice of medicine is not an exact science and that Practitioner makes no warranties or guarantees regarding treatment outcomes. I acknowledge that I will receive a copy of this consent concurrently upon execution via email to the email address I last provided but may also request a printed copy by calling the office of [facility].    I understand that my insurance will be billed for this visit.   I have read or had this consent read to me.  I understand the contents of this consent, which adequately explains the benefits and risks of the Services being provided via telemedicine.   I have been provided ample opportunity to ask questions regarding this consent and the Services and have had my questions answered to my satisfaction.  I give my informed consent for the services to be provided through the use of telemedicine in my medical care  By participating in this telemedicine visit  I agree to the above.

## 2018-04-25 ENCOUNTER — Other Ambulatory Visit: Payer: Self-pay

## 2018-04-25 ENCOUNTER — Ambulatory Visit (INDEPENDENT_AMBULATORY_CARE_PROVIDER_SITE_OTHER): Payer: BLUE CROSS/BLUE SHIELD | Admitting: Family Medicine

## 2018-04-25 DIAGNOSIS — E785 Hyperlipidemia, unspecified: Secondary | ICD-10-CM | POA: Diagnosis not present

## 2018-04-25 DIAGNOSIS — I10 Essential (primary) hypertension: Secondary | ICD-10-CM

## 2018-04-25 DIAGNOSIS — Z951 Presence of aortocoronary bypass graft: Secondary | ICD-10-CM | POA: Diagnosis not present

## 2018-04-25 DIAGNOSIS — K219 Gastro-esophageal reflux disease without esophagitis: Secondary | ICD-10-CM | POA: Diagnosis not present

## 2018-04-25 DIAGNOSIS — F411 Generalized anxiety disorder: Secondary | ICD-10-CM

## 2018-04-25 MED ORDER — CLONAZEPAM 1 MG PO TABS: 1.0000 mg | ORAL_TABLET | Freq: Two times a day (BID) | ORAL | 5 refills | Status: DC

## 2018-04-25 MED ORDER — METOPROLOL SUCCINATE ER 50 MG PO TB24: ORAL_TABLET | ORAL | 3 refills | Status: DC

## 2018-04-25 MED ORDER — PANTOPRAZOLE SODIUM 40 MG PO TBEC: 40.0000 mg | DELAYED_RELEASE_TABLET | Freq: Every day | ORAL | 3 refills | Status: DC

## 2018-04-25 MED ORDER — ATORVASTATIN CALCIUM 80 MG PO TABS: 80.0000 mg | ORAL_TABLET | Freq: Every day | ORAL | 3 refills | Status: DC

## 2018-04-25 NOTE — Progress Notes (Signed)
Patient ID: [name] [name], male   DOB: [birth year], 53 y.o.   MRN: [id number]  This visit type was conducted due to national recommendations for restrictions regarding the COVID-19 pandemic in an effort to limit this patient's exposure and mitigate transmission in our community.   Virtual Visit via Video Note  I connected with [name] [name] on 04/25/18 at  1:45 PM EDT by a video enabled telemedicine application and verified that I am speaking with the correct person using two identifiers.  Location patient: home Location provider:work or home office Persons participating in the virtual visit: patient, provider  I discussed the limitations of evaluation and management by telemedicine and the availability of in person appointments. The patient expressed understanding and agreed to proceed.   HPI: Patient has history of CAD with prior CABG x5, hypertension, GERD, hyperlipidemia, chronic anxiety.  Medications reviewed.  He is taking baby aspirin 1 daily, metoprolol XL 50 mg daily, pantoprazole 40 mg daily, atorvastatin 80 mg daily, and Klonopin 1 mg twice daily  Denies any recent chest pains.  Appetite and weight stable.  No cough or dyspnea.  His work has scaled back recently.  Basically needs refills of all medications.  Denies any side effects from any medications.  Compliant with therapy.  He is due for labs at this time.  Reflux has been well controlled with Protonix.  No exertional chest pains.  No headaches or dizziness.    ROS: See pertinent positives and negatives per HPI.  Past Medical History:  Diagnosis Date  . Acute bronchitis 03/31/2009  . ANXIETY 11/10/2008  . Arthritis   . Coronary artery disease   . EPIGASTRIC PAIN 12/08/2009  . GERD (gastroesophageal reflux disease)   . 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 Other and unspecified hyperlipidemia 12/29/2008    Past Surgical History:  Procedure Laterality Date  . CARDIAC CATHETERIZATION N/A 08/11/2015   Procedure: Left  Heart Cath and Coronary Angiography;  Surgeon: Nelva Bush, MD;  Location: [facility];  Service: Cardiovascular;  Laterality: N/A;  . COLONOSCOPY    . CORONARY ARTERY BYPASS GRAFT N/A 08/17/2015   Procedure: CORONARY ARTERY BYPASS GRAFTING (CABG) times five, LIMA to LAD, SVG to Diagonal sequentially SVG to OM-PL and Left Radial Artery to PD;  Surgeon: Melrose Nakayama, MD;  Location: [facility];  Service: Open Heart Surgery;  Laterality: N/A;  . RADIAL ARTERY HARVEST Left 08/17/2015   Procedure: RADIAL ARTERY HARVEST;  Surgeon: Melrose Nakayama, MD;  Location: [facility];  Service: Open Heart Surgery;  Laterality: Left;  . TEE WITHOUT CARDIOVERSION N/A 08/17/2015   Procedure: TRANSESOPHAGEAL ECHOCARDIOGRAM (TEE);  Surgeon: Melrose Nakayama, MD;  Location: [facility];  Service: Open Heart Surgery;  Laterality: N/A;  . WISDOM TOOTH EXTRACTION  1989    Family History  Problem Relation Age of Onset  . Cancer Mother        brain ca  . Heart disease Father 75       CABG  . Heart disease Paternal Aunt   . Heart disease Paternal Uncle   . Diabetes Brother   . Stroke Neg Hx        family hx  . Colon cancer Neg Hx     SOCIAL HX: Quit smoking 2017   Current Outpatient Medications:  .  acetaminophen (TYLENOL) 325 MG tablet, Take 650 mg by mouth every 6 (six) hours as needed., Disp: , Rfl:  .  aspirin 81 MG tablet, Take 1 tablet (81 mg  total) by mouth daily., Disp: , Rfl:  .  atorvastatin (LIPITOR) 80 MG tablet, Take 1 tablet (80 mg total) by mouth daily., Disp: 90 tablet, Rfl: 3 .  clonazePAM (KLONOPIN) 1 MG tablet, Take 1 tablet (1 mg total) by mouth 2 (two) times daily., Disp: 60 tablet, Rfl: 5 .  metoprolol succinate (TOPROL-XL) 50 MG 24 hr tablet, Take with or immediately following a meal., Disp: 90 tablet, Rfl: 3 .  nicotine (NICODERM CQ) 21 mg/24hr patch, Place 1 patch (21 mg total) onto the skin daily., Disp: 28 patch, Rfl: 0 .  nitroGLYCERIN (NITROSTAT) 0.4 MG SL tablet, Place 1  tablet (0.4 mg total) under the tongue as needed for chest pain., Disp: 30 tablet, Rfl: 11 .  pantoprazole (PROTONIX) 40 MG tablet, Take 1 tablet (40 mg total) by mouth daily., Disp: 90 tablet, Rfl: 3 .  sildenafil (REVATIO) 20 MG tablet, Take 2 to 5 tablets one hour prior to sexual activity., Disp: 90 tablet, Rfl: 1  EXAM:  VITALS per patient if applicable:  GENERAL: alert, oriented, appears well and in no acute distress  HEENT: atraumatic, conjunttiva clear, no obvious abnormalities on inspection of external nose and ears  NECK: normal movements of the head and neck  LUNGS: on inspection no signs of respiratory distress, breathing rate appears normal, no obvious gross SOB, gasping or wheezing  CV: no obvious cyanosis  MS: moves all visible extremities without noticeable abnormality  PSYCH/NEURO: pleasant and cooperative, no obvious depression or anxiety, speech and thought processing grossly intact  ASSESSMENT AND PLAN:  Discussed the following assessment and plan:  #1 hypertension.  Patient has had controlled blood pressures by home readings -Refill metoprolol for 1 year  #2 history of CAD-symptomatically stable -We recommend continued follow-up with cardiology -Continue atorvastatin, metoprolol, and aspirin  #3 dyslipidemia -Continue atorvastatin and patient will need future labs with lipids and hepatic panel hopefully within a few months  #4 GERD controlled with Protonix -Refilled Protonix for 1 year  #5 chronic anxiety -Discussed trying to taper back Klonopin.  Recommend he try to taper back to 0.5 mg twice daily   I discussed the assessment and treatment plan with the patient. The patient was provided an opportunity to ask questions and all were answered. The patient agreed with the plan and demonstrated an understanding of the instructions.   The patient was advised to call back or seek an in-person evaluation if the symptoms worsen or if the condition fails to  improve as anticipated.   Carolann Littler, MD

## 2018-04-25 NOTE — Telephone Encounter (Signed)
Patient has a [facility] appointment today at 1:45 pm for med refill since it has been over a year since he was last seen.

## 2018-04-29 NOTE — Progress Notes (Signed)
Virtual Visit via Video Note   This visit type was conducted due to national recommendations for restrictions regarding the COVID-19 Pandemic (e.g. social distancing) in an effort to limit this patient's exposure and mitigate transmission in our community.  Due to his co-morbid illnesses, this patient is at least at moderate risk for complications without adequate follow up.  This format is felt to be most appropriate for this patient at this time.  All issues noted in this document were discussed and addressed.  A limited physical exam was performed with this format.  Please refer to the patient's chart for his consent to telehealth for [facility].   Evaluation Performed:  Follow-up visit  Date:  04/30/2018   ID:  [name] [name], DOB February 13, 1965, MRN [id number]  Patient Location: Home Provider Location: Office  PCP:  [name], MD  Cardiologist:  Jenkins Rouge, MD  Electrophysiologist:  None   Chief Complaint:  CAD   History of Present Illness:    [name] [name] is a 53 y.o. male with history of CAD status post CABG x5 08/2015 (left internal mammary artery to LAD, saphenous vein graft to first diagonal, sequential saphenous vein graft to obtuse marginal 1 and posterior lateral 2, left radial artery to posterior descending). , hyperlipidemia and tobacco abuse.  Patient last saw Dr. Johnsie [name] 12/2015 and Toprol was increased to 50 mg daily and Imdur stopped.  Was still smoking and asked to quit. [name] [name] was seen by Nena Polio PA 02/2017.   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 Thought had been to move to [facility] but plans to remain in this area.   He did have ham last night but overall eats low-sodium diet.  He does not exercise outside of work but is up and down steps and doing Architect all day long.   Last year LDL was 73it is followed by his PCP .  Unable to have checked currently due to COVID.   Today he  feels well with no chest pain or SOB.  He has no dizziness, no palpitations.  His BP is elevated some but is usually controlled per pt. He only exercises at work, with up and down stairs. His diet is healthy.  When out shopping he wears mask if more people around, but does try and go early when not many are around.   No cough or fevers.   He continues to smoke about a ppd.  He is planning on stopping, he has stopped before cold [place] and plans on doing this again.  But has not yet done.  His wife is hoping to stop as well.   The patient does not have symptoms concerning for COVID-19 infection (fever, chills, cough, or new shortness of breath).    Past Medical History:  Diagnosis Date  . Acute bronchitis 03/31/2009  . ANXIETY 11/10/2008  . Arthritis   . Coronary artery disease   . EPIGASTRIC PAIN 12/08/2009  . GERD (gastroesophageal reflux disease)   . 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 Other and unspecified hyperlipidemia 12/29/2008   Past Surgical History:  Procedure Laterality Date  . CARDIAC CATHETERIZATION [name] [name]/A 08/11/2015   Procedure: Left Heart Cath and Coronary Angiography;  Surgeon: Nelva Bush, MD;  Location: [place] [facility];  Service: Cardiovascular;  Laterality: [name] [name]/A;  . COLONOSCOPY    . CORONARY ARTERY BYPASS GRAFT [name] [name]/A 08/17/2015   Procedure: CORONARY ARTERY  BYPASS GRAFTING (CABG) times five, LIMA to LAD, SVG to Diagonal sequentially SVG to OM-PL and Left Radial Artery to PD;  Surgeon: Melrose Nakayama, MD;  Location: [place];  Service: Open Heart Surgery;  Laterality: [name] [name]/A;  . RADIAL ARTERY HARVEST Left 08/17/2015   Procedure: RADIAL ARTERY HARVEST;  Surgeon: Melrose Nakayama, MD;  Location: [place];  Service: Open Heart Surgery;  Laterality: Left;  . TEE WITHOUT CARDIOVERSION [name] [name]/A 08/17/2015   Procedure: TRANSESOPHAGEAL ECHOCARDIOGRAM (TEE);  Surgeon: Melrose Nakayama, MD;  Location: [place];  Service: Open Heart Surgery;  Laterality: [name] [name]/A;  . WISDOM TOOTH EXTRACTION   1989     Current Meds  Medication Sig  . aspirin 81 MG tablet Take 1 tablet (81 mg total) by mouth daily.  Marland Kitchen atorvastatin (LIPITOR) 80 MG tablet Take 1 tablet (80 mg total) by mouth daily.  . clonazePAM (KLONOPIN) 1 MG tablet Take 1 tablet (1 mg total) by mouth 2 (two) times daily.  . metoprolol succinate (TOPROL-XL) 50 MG 24 hr tablet Take with or immediately following a meal.  . nitroGLYCERIN (NITROSTAT) 0.4 MG SL tablet Place 1 tablet (0.4 mg total) under the tongue as needed for chest pain.  . pantoprazole (PROTONIX) 40 MG tablet Take 1 tablet (40 mg total) by mouth daily.  . sildenafil (REVATIO) 20 MG tablet Take 2 to 5 tablets one hour prior to sexual activity.  . [DISCONTINUED] acetaminophen (TYLENOL) 325 MG tablet Take 650 mg by mouth every 6 (six) hours as needed.     Allergies:   Patient has no known allergies.   Social History   Tobacco Use  . Smoking status: Former Smoker    Packs/day: 1.00    Years: 8.00    Pack years: 8.00    Types: Cigarettes    Last attempt to quit: 08/14/2015    Years since quitting: 2.7  . Smokeless tobacco: Never Used  Substance Use Topics  . Alcohol use: Yes    Alcohol/week: 3.0 standard drinks    Types: 3 Cans of beer per week    Comment: social  . Drug use: No     Family Hx: The patient's family history includes Cancer in his mother; Diabetes in his brother; Heart disease in his paternal aunt and paternal uncle; Heart disease (age of onset: 18) in his father. There is no history of Stroke or Colon cancer.  ROS:   Please see the history of present illness.    General:no colds or fevers, no weight changes Skin:no rashes or ulcers HEENT:no blurred vision, no congestion CV:see HPI PUL:see HPI GI:no diarrhea constipation or melena, no indigestion GU:no hematuria, no dysuria MS:no joint pain, no claudication Neuro:no syncope, no lightheadedness Endo:no diabetes, no thyroid disease  All other systems reviewed and are negative.    Prior CV studies:   The following studies were reviewed today:  Echo 08/17/15 Study Conclusions  - Left ventricle: Systolic function was normal. The estimated   ejection fraction was in the range of 55% to 60%. - Aortic valve: No evidence of vegetation. - Mitral valve: No evidence of vegetation. - Left atrium: No evidence of thrombus in the atrial cavity or   appendage. No evidence of thrombus in the appendage. - Atrial septum: No defect or patent foramen ovale was identified.   Echo contrast study showed no right-to-left atrial level shunt,   following an increase in RA pressure induced by provocative   maneuvers. - Tricuspid valve: No evidence of vegetation.  Labs/Other Tests  and Data Reviewed:    EKG:  An ECG dated 08/17/17 was personally reviewed today and demonstrated:  SR with RBBB  Please note the EKG reviewed was 02/26/17 not 08/17/17   Recent Labs: No results found for requested labs within last 8760 hours.   Recent Lipid Panel Lab Results  Component Value Date/Time   CHOL 137 02/26/2017 09:03 AM   TRIG 92 02/26/2017 09:03 AM   HDL 46 02/26/2017 09:03 AM   CHOLHDL 3.0 02/26/2017 09:03 AM   CHOLHDL 3 12/12/2015 09:22 AM   LDLCALC 73 02/26/2017 09:03 AM   LDLDIRECT 157.6 12/21/2008 08:31 AM    Wt Readings from Last 3 Encounters:  04/30/18 196 lb (88.9 kg)  03/20/17 204 lb 9.6 oz (92.8 kg)  02/26/17 207 lb 8 oz (94.1 kg)     Objective:    Vital Signs:  BP (!) 135/94   Pulse 88   Ht 5' 11.5" (1.816 m)   Wt 196 lb (88.9 kg)   BMI 26.96 kg/m    VITAL SIGNS:  reviewed  General:  Color good.  Normal in appearance Neuro alert and oriented X 3 MAE follows commands Lungs:  Can speak in full sentences without SOB or wheezes Psych pleasant affect   ASSESSMENT & PLAN:    1. CAD with hx of CABG  X 5 now without angina and active.  Continue BB and lipids and asa 2. HTN BP remains elevated he tells me other times controlled. Continue to monitor, he is not on HCTZ  that Ms. Bonnell Public PA recommended.  At home have been controlled mostly. 3. HLD continue statin, followed by PCP 4. Tobacco use, 1PPD discussed ways and reasons to stop.    COVID-19 Education: The signs and symptoms of COVID-19 were discussed with the patient and how to seek care for testing (follow up with PCP or arrange E-visit).  The importance of social distancing was discussed today.  Time:   Today, I have spent 10 minutes with the patient with telehealth technology discussing the above problems.  And 10 min preparing.   Medication Adjustments/Labs and Tests Ordered: Current medicines are reviewed at length with the patient today.  Concerns regarding medicines are outlined above.   Tests Ordered: No orders of the defined types were placed in this encounter.   Medication Changes: No orders of the defined types were placed in this encounter.   Disposition:  Follow up in 1 year(s) with Dr. Johnsie [name].   Signed, Cecilie Kicks, NP  04/30/2018 9:08 AM    [place]

## 2018-04-30 ENCOUNTER — Other Ambulatory Visit: Payer: Self-pay

## 2018-04-30 ENCOUNTER — Encounter: Payer: Self-pay | Admitting: Cardiology

## 2018-04-30 ENCOUNTER — Telehealth (INDEPENDENT_AMBULATORY_CARE_PROVIDER_SITE_OTHER): Payer: BLUE CROSS/BLUE SHIELD | Admitting: Cardiology

## 2018-04-30 VITALS — BP 135/94 | HR 88 | Ht 71.5 in | Wt 196.0 lb

## 2018-04-30 DIAGNOSIS — E785 Hyperlipidemia, unspecified: Secondary | ICD-10-CM

## 2018-04-30 DIAGNOSIS — I2510 Atherosclerotic heart disease of native coronary artery without angina pectoris: Secondary | ICD-10-CM

## 2018-04-30 DIAGNOSIS — Z951 Presence of aortocoronary bypass graft: Secondary | ICD-10-CM

## 2018-04-30 DIAGNOSIS — I10 Essential (primary) hypertension: Secondary | ICD-10-CM

## 2018-04-30 DIAGNOSIS — Z720 Tobacco use: Secondary | ICD-10-CM

## 2018-04-30 NOTE — Patient Instructions (Signed)
Medication Instructions:  Your physician recommends that you continue on your current medications as directed. Please refer to the Current Medication list given to you today.  If you need a refill on your cardiac medications before your next appointment, please call your pharmacy.   Lab work: None ordered  If you have labs (blood work) drawn today and your tests are completely normal, you will receive your results only by: Marland Kitchen MyChart Message (if you have MyChart) OR . A paper copy in the mail If you have any lab test that is abnormal or we need to change your treatment, we will call you to review the results.  Testing/Procedures: None ordered  Follow-Up: At [facility], you and your health needs are our priori2gnated Provider Care Teams.  These Care Teams include your primary Cardiologist (physician) and Advanced Practice Providers (APPs -  Physician Assistants and Nurse Practitioners) who all work together to provide you with the care you need, when you need it. You will need a follow up appointment in 12 months.  Please call our office 2 months in advance to schedule this appointment.  You may see Jenkins Rouge, MD or one of the following Advanced Practice Providers on your designated Care Team:   Truitt Merle, NP Cecilie Kicks, NP . Kathyrn Drown, NP  Any Other Special Instructions Will Be Listed Below (If Applicable).

## 2018-06-13 ENCOUNTER — Encounter: Payer: Self-pay | Admitting: Family Medicine

## 2018-06-13 ENCOUNTER — Other Ambulatory Visit: Payer: Self-pay

## 2018-06-13 ENCOUNTER — Ambulatory Visit (INDEPENDENT_AMBULATORY_CARE_PROVIDER_SITE_OTHER): Payer: BC Managed Care – PPO | Admitting: Family Medicine

## 2018-06-13 VITALS — BP 128/80 | HR 70 | Temp 97.6°F | Wt 204.6 lb

## 2018-06-13 DIAGNOSIS — Z0000 Encounter for general adult medical examination without abnormal findings: Secondary | ICD-10-CM

## 2018-06-13 DIAGNOSIS — Z23 Encounter for immunization: Secondary | ICD-10-CM | POA: Diagnosis not present

## 2018-06-13 LAB — HEPATIC FUNCTION PANEL
ALT: 21 U/L (ref 0–53)
AST: 21 U/L (ref 0–37)
Albumin: 4.3 g/dL (ref 3.5–5.2)
Alkaline Phosphatase: 71 U/L (ref 39–117)
Bilirubin, Direct: 0.1 mg/dL (ref 0.0–0.3)
Total Bilirubin: 0.4 mg/dL (ref 0.2–1.2)
Total Protein: 6.8 g/dL (ref 6.0–8.3)

## 2018-06-13 LAB — CBC WITH DIFFERENTIAL/PLATELET
Basophils Absolute: 0.1 10*3/uL (ref 0.0–0.1)
Basophils Relative: 1.2 % (ref 0.0–3.0)
Eosinophils Absolute: 0.3 10*3/uL (ref 0.0–0.7)
Eosinophils Relative: 5.1 % — ABNORMAL HIGH (ref 0.0–5.0)
HCT: 43.7 % (ref 39.0–52.0)
Hemoglobin: 14.9 g/dL (ref 13.0–17.0)
Lymphocytes Relative: 20.3 % (ref 12.0–46.0)
Lymphs Abs: 1.2 10*3/uL (ref 0.7–4.0)
MCHC: 34 g/dL (ref 30.0–36.0)
MCV: 91.3 fl (ref 78.0–100.0)
Monocytes Absolute: 0.5 10*3/uL (ref 0.1–1.0)
Monocytes Relative: 8.1 % (ref 3.0–12.0)
Neutro Abs: 3.8 10*3/uL (ref 1.4–7.7)
Neutrophils Relative %: 65.3 % (ref 43.0–77.0)
Platelets: 210 10*3/uL (ref 150.0–400.0)
RBC: 4.78 Mil/uL (ref 4.22–5.81)
RDW: 13.1 % (ref 11.5–15.5)
WBC: 5.8 10*3/uL (ref 4.0–10.5)

## 2018-06-13 LAB — PSA: PSA: 1.04 ng/mL (ref 0.10–4.00)

## 2018-06-13 LAB — LIPID PANEL
Cholesterol: 137 mg/dL (ref 0–200)
HDL: 42.4 mg/dL (ref >39.00)
LDL Cholesterol: 69 mg/dL (ref 0–99)
NonHDL: 94.41
Total CHOL/HDL Ratio: 3
Triglycerides: 129 mg/dL (ref 0.0–149.0)
VLDL: 25.8 mg/dL (ref 0.0–40.0)

## 2018-06-13 LAB — BASIC METABOLIC PANEL
BUN: 11 mg/dL (ref 6–23)
CO2: 28 mEq/L (ref 19–32)
Calcium: 9.5 mg/dL (ref 8.4–10.5)
Chloride: 103 mEq/L (ref 96–112)
Creatinine, Ser: 1.1 mg/dL (ref 0.40–1.50)
GFR: 69.74 mL/min (ref >60.00)
Glucose, Bld: 75 mg/dL (ref 70–99)
Potassium: 4.6 mEq/L (ref 3.5–5.1)
Sodium: 139 mEq/L (ref 135–145)

## 2018-06-13 LAB — TSH: TSH: 1.02 u[IU]/mL (ref 0.35–4.50)

## 2018-06-13 MED ORDER — SILDENAFIL CITRATE 20 MG PO TABS: ORAL_TABLET | ORAL | 1 refills | Status: DC

## 2018-06-13 NOTE — Addendum Note (Signed)
Addended by: Elmer Picker on: 06/13/2018 09:37 AM   Modules accepted: Orders

## 2018-06-13 NOTE — Progress Notes (Signed)
Subjective:     Patient ID: [name] [name], male   DOB: April 19, 1965, 53 y.o.   MRN: [id number]  HPI Patient is seen for physical exam.  He has history of CAD and had bypass few years ago.  He has done recently well since that time.  Unfortunately, he has resumed smoking about a pack of cigarettes per day.  He is due for tetanus booster.  Colonoscopy up-to-date.  Next colonoscopy due 2022  Current medications include aspirin, Lipitor, clonazepam, metoprolol.  He is not had any recent chest pains.  Appetite and weight are stable.  Generally feels well.  Past Medical History:  Diagnosis Date  . Acute bronchitis 03/31/2009  . ANXIETY 11/10/2008  . Arthritis   . Coronary artery disease   . EPIGASTRIC PAIN 12/08/2009  . GERD (gastroesophageal reflux disease)   . 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 Other and unspecified hyperlipidemia 12/29/2008   Past Surgical History:  Procedure Laterality Date  . CARDIAC CATHETERIZATION N/A 08/11/2015   Procedure: Left Heart Cath and Coronary Angiography;  Surgeon: Nelva Bush, MD;  Location: [place] [facility];  Service: Cardiovascular;  Laterality: N/A;  . COLONOSCOPY    . CORONARY ARTERY BYPASS GRAFT N/A 08/17/2015   Procedure: CORONARY ARTERY BYPASS GRAFTING (CABG) times five, LIMA to LAD, SVG to Diagonal sequentially SVG to OM-PL and Left Radial Artery to PD;  Surgeon: Melrose Nakayama, MD;  Location: [place];  Service: Open Heart Surgery;  Laterality: N/A;  . RADIAL ARTERY HARVEST Left 08/17/2015   Procedure: RADIAL ARTERY HARVEST;  Surgeon: Melrose Nakayama, MD;  Location: [place];  Service: Open Heart Surgery;  Laterality: Left;  . TEE WITHOUT CARDIOVERSION N/A 08/17/2015   Procedure: TRANSESOPHAGEAL ECHOCARDIOGRAM (TEE);  Surgeon: Melrose Nakayama, MD;  Location: [place];  Service: Open Heart Surgery;  Laterality: N/A;  . [place]    reports that he has been smoking cigarettes. He has a 8.00 pack-year smoking history.  He has never used smokeless tobacco. He reports current alcohol use of about 3.0 standard drinks of alcohol per week. He reports that he does not use drugs. family history includes Cancer in his mother; Diabetes in his brother; Heart disease in his paternal aunt and paternal uncle; Heart disease (age of onset: 52) in his father. No Known Allergies   Review of Systems  Constitutional: Negative for activity change, appetite change, fatigue and fever.  HENT: Negative for congestion, ear pain and trouble swallowing.   Eyes: Negative for pain and visual disturbance.  Respiratory: Negative for cough, shortness of breath and wheezing.   Cardiovascular: Negative for chest pain and palpitations.  Gastrointestinal: Negative for abdominal distention, abdominal pain, blood in stool, constipation, diarrhea, nausea, rectal pain and vomiting.  Genitourinary: Negative for dysuria, hematuria and testicular pain.  Musculoskeletal: Negative for arthralgias and joint swelling.  Skin: Negative for rash.  Neurological: Negative for dizziness, syncope and headaches.  Hematological: Negative for adenopathy.  Psychiatric/Behavioral: Negative for confusion and dysphoric mood.       Objective:   Physical Exam Constitutional:      General: He is not in acute distress.    Appearance: He is well-developed.  HENT:     Head: Normocephalic and atraumatic.     Right Ear: External ear normal.     Left Ear: External ear normal.  Eyes:     Conjunctiva/sclera: Conjunctivae normal.     Pupils: Pupils are equal, round, and reactive to light.  Neck:  Musculoskeletal: Normal range of motion and neck supple.     Thyroid: No thyromegaly.  Cardiovascular:     Rate and Rhythm: Normal rate and regular rhythm.     Heart sounds: Normal heart sounds. No murmur.  Pulmonary:     Effort: No respiratory distress.     Breath sounds: No wheezing or rales.  Abdominal:     General: Bowel sounds are normal. There is no  distension.     Palpations: Abdomen is soft. There is no mass.     Tenderness: There is no abdominal tenderness. There is no guarding or rebound.  Lymphadenopathy:     Cervical: No cervical adenopathy.  Skin:    Findings: No rash.  Neurological:     Mental Status: He is alert and oriented to person, place, and time.     Cranial Nerves: No cranial nerve deficit.     Deep Tendon Reflexes: Reflexes normal.        Assessment:     Physical exam.  Patient has history of CAD with prior bypass with ongoing nicotine use.  We discussed the following health maintenance, preventative issues    Plan:     -Recommend he continue with yearly flu vaccine -Tetanus booster given -Strongly advised cigarette smoking cessation.  Current motivation is fairly low -Obtain follow-up labs including PSA -Consider shingles vaccine.  He declines at this time -Consider low-dose CT lung cancer screening in 1 year when he turns 54 if he is interested -Repeat colonoscopy in 2 years  [name] MD [facility]

## 2018-06-13 NOTE — Addendum Note (Signed)
Addended by: Rebecca Eaton on: 06/13/2018 09:38 AM   Modules accepted: Orders

## 2018-06-13 NOTE — Addendum Note (Signed)
Addended by: Elmer Picker on: 06/13/2018 09:38 AM   Modules accepted: Orders

## 2018-10-31 ENCOUNTER — Other Ambulatory Visit: Payer: Self-pay | Admitting: Family Medicine

## 2018-10-31 NOTE — Telephone Encounter (Signed)
Okay for refill?  

## 2018-11-10 NOTE — Telephone Encounter (Signed)
Pt's wife stated recent refill for clonazePAM (KLONOPIN) 1 MG tablet was only for 15 days. Pt normally gets a 30 day supply. They would like to know why half the rx was sent in. Please advise.

## 2018-11-11 MED ORDER — CLONAZEPAM 1 MG PO TABS: 1.0000 mg | ORAL_TABLET | Freq: Two times a day (BID) | ORAL | 5 refills | Status: DC

## 2018-11-11 NOTE — Addendum Note (Signed)
Addended by: [name] on: 11/11/2018 09:27 PM   Modules accepted: Orders

## 2018-11-11 NOTE — Addendum Note (Signed)
Addended by: Benson Setting L on: 11/11/2018 09:23 AM   Modules accepted: Orders

## 2019-01-20 ENCOUNTER — Other Ambulatory Visit: Payer: Self-pay | Admitting: Family Medicine

## 2019-02-23 ENCOUNTER — Other Ambulatory Visit: Payer: Self-pay | Admitting: Family Medicine

## 2019-03-24 ENCOUNTER — Other Ambulatory Visit: Payer: Self-pay | Admitting: Family Medicine

## 2019-04-11 ENCOUNTER — Other Ambulatory Visit: Payer: Self-pay | Admitting: Family Medicine

## 2019-05-04 ENCOUNTER — Other Ambulatory Visit: Payer: Self-pay | Admitting: Family Medicine

## 2019-05-26 ENCOUNTER — Other Ambulatory Visit: Payer: Self-pay | Admitting: Family Medicine

## 2019-05-27 NOTE — Telephone Encounter (Signed)
Last ov:06/13/2018 Last filled:11/11/2018

## 2019-05-27 NOTE — Telephone Encounter (Signed)
Needs office follow up soon.  Refilled Klonopin for one month

## 2019-06-12 ENCOUNTER — Other Ambulatory Visit: Payer: Self-pay | Admitting: Family Medicine

## 2019-07-01 ENCOUNTER — Other Ambulatory Visit: Payer: Self-pay | Admitting: Family Medicine

## 2019-07-02 NOTE — Telephone Encounter (Signed)
Needs office follow up.   Will refill once until he can set up follow up.

## 2019-07-02 NOTE — Telephone Encounter (Signed)
Patient is aware that he should schedule an appointment.  However, the patient states that he does not have insurance and will be going out of town for work.  Please advise.

## 2019-07-29 ENCOUNTER — Other Ambulatory Visit: Payer: Self-pay

## 2019-07-29 ENCOUNTER — Other Ambulatory Visit: Payer: Self-pay | Admitting: Family Medicine

## 2019-07-29 MED ORDER — CLONAZEPAM 1 MG PO TABS: 1.0000 mg | ORAL_TABLET | Freq: Two times a day (BID) | ORAL | 0 refills | Status: DC

## 2019-07-29 NOTE — Telephone Encounter (Signed)
Please advise accidentally pronted

## 2019-07-30 ENCOUNTER — Other Ambulatory Visit: Payer: Self-pay | Admitting: Family Medicine

## 2019-07-30 MED ORDER — CLONAZEPAM 1 MG PO TABS: 1.0000 mg | ORAL_TABLET | Freq: Two times a day (BID) | ORAL | 0 refills | Status: DC

## 2019-09-23 ENCOUNTER — Telehealth: Payer: Self-pay | Admitting: Family Medicine

## 2019-09-23 NOTE — Telephone Encounter (Signed)
Pt is calling to have the medication   clonazePAM (KLONOPIN) 1 MG tablet   Refilled.  [id number] - [place], [place] - [address]  [address], [place] [address]  Phone:  [phone] Fax:  [phone]   Please advise

## 2019-09-24 ENCOUNTER — Other Ambulatory Visit: Payer: Self-pay | Admitting: Family Medicine

## 2019-09-24 NOTE — Telephone Encounter (Signed)
Not seen in over one year.  Because this is controlled, needs follow up.  Could be virtual if he is down in [facility] [place].

## 2019-09-24 NOTE — Telephone Encounter (Signed)
Spoke with the pt and informed him of the message below.  Virtual visit scheduled for 10/1.

## 2019-10-02 ENCOUNTER — Other Ambulatory Visit: Payer: Self-pay

## 2019-10-02 ENCOUNTER — Encounter: Payer: Self-pay | Admitting: Family Medicine

## 2019-10-02 ENCOUNTER — Telehealth (INDEPENDENT_AMBULATORY_CARE_PROVIDER_SITE_OTHER): Payer: Self-pay | Admitting: Family Medicine

## 2019-10-02 VITALS — Ht 72.0 in | Wt 195.0 lb

## 2019-10-02 DIAGNOSIS — F411 Generalized anxiety disorder: Secondary | ICD-10-CM

## 2019-10-02 DIAGNOSIS — E785 Hyperlipidemia, unspecified: Secondary | ICD-10-CM

## 2019-10-02 DIAGNOSIS — K219 Gastro-esophageal reflux disease without esophagitis: Secondary | ICD-10-CM

## 2019-10-02 DIAGNOSIS — I10 Essential (primary) hypertension: Secondary | ICD-10-CM

## 2019-10-02 MED ORDER — PANTOPRAZOLE SODIUM 40 MG PO TBEC: 40.0000 mg | DELAYED_RELEASE_TABLET | Freq: Every day | ORAL | 1 refills | Status: DC

## 2019-10-02 MED ORDER — SILDENAFIL CITRATE 20 MG PO TABS: ORAL_TABLET | ORAL | 1 refills | Status: DC

## 2019-10-02 MED ORDER — ATORVASTATIN CALCIUM 80 MG PO TABS
80.0000 mg | ORAL_TABLET | Freq: Every day | ORAL | 1 refills | Status: DC
Start: 2019-10-02 — End: 2020-05-16

## 2019-10-02 MED ORDER — CLONAZEPAM 1 MG PO TABS: 1.0000 mg | ORAL_TABLET | Freq: Two times a day (BID) | ORAL | 5 refills | Status: DC

## 2019-10-02 NOTE — Progress Notes (Signed)
Patient ID: [name] [name], male   DOB: Oct 12, 1965, 54 y.o.   MRN: [id number]  This visit type was conducted due to national recommendations for restrictions regarding the COVID-19 pandemic in an effort to limit this patient's exposure and mitigate transmission in our community.   Virtual Visit via Video Note  I connected with [name] [name] on 10/02/19 at  9:00 AM EDT by a video enabled telemedicine application and verified that I am speaking with the correct person using two identifiers.  Location patient: home Location provider:work or home office Persons participating in the virtual visit: patient, provider  I discussed the limitations of evaluation and management by telemedicine and the availability of in person appointments. The patient expressed understanding and agreed to proceed.   HPI:  [name] [name] has history of CAD.  Bypass few years ago.  Doing well overall.  Currently living down in [facility] and anticipates working there for some time.  No recent chest pains.  He has history of hypertension, hyperlipidemia, nicotine use, chronic anxiety.  He has history of erectile dysfunction.  Requesting refills of Revatio.  Other medications include Protonix 40 mg daily, Toprol-XL 50 mg daily, clonazepam 1 mg twice daily, atorvastatin 80 mg daily, and aspirin 1 daily.  Needs refills of basically all medications except metoprolol.  Overdue for labs.   ROS: See pertinent positives and negatives per HPI.  Past Medical History:  Diagnosis Date  . Acute bronchitis 03/31/2009  . ANXIETY 11/10/2008  . Arthritis   . Coronary artery disease   . EPIGASTRIC PAIN 12/08/2009  . GERD (gastroesophageal reflux disease)   . 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 Other and unspecified hyperlipidemia 12/29/2008    Past Surgical History:  Procedure Laterality Date  . CARDIAC CATHETERIZATION N/A 08/11/2015   Procedure: Left Heart Cath and Coronary Angiography;  Surgeon: Nelva Bush, MD;   Location: [place] [facility];  Service: Cardiovascular;  Laterality: N/A;  . COLONOSCOPY    . CORONARY ARTERY BYPASS GRAFT N/A 08/17/2015   Procedure: CORONARY ARTERY BYPASS GRAFTING (CABG) times five, LIMA to LAD, SVG to Diagonal sequentially SVG to OM-PL and Left Radial Artery to PD;  Surgeon: Melrose Nakayama, MD;  Location: [place];  Service: Open Heart Surgery;  Laterality: N/A;  . RADIAL ARTERY HARVEST Left 08/17/2015   Procedure: RADIAL ARTERY HARVEST;  Surgeon: Melrose Nakayama, MD;  Location: [place];  Service: Open Heart Surgery;  Laterality: Left;  . TEE WITHOUT CARDIOVERSION N/A 08/17/2015   Procedure: TRANSESOPHAGEAL ECHOCARDIOGRAM (TEE);  Surgeon: Melrose Nakayama, MD;  Location: [place];  Service: Open Heart Surgery;  Laterality: N/A;  . WISDOM TOOTH EXTRACTION  1989    Family History  Problem Relation Age of Onset  . Cancer Mother        brain ca  . Heart disease Father 20       CABG  . Heart disease Paternal Aunt   . Heart disease Paternal Uncle   . Diabetes Brother   . Stroke Neg Hx        family hx  . Colon cancer Neg Hx     SOCIAL HX: Quit smoking 2017   Current Outpatient Medications:  .  aspirin 81 MG tablet, Take 1 tablet (81 mg total) by mouth daily., Disp: , Rfl:  .  atorvastatin (LIPITOR) 80 MG tablet, TAKE 1 TABLET BY MOUTH EVERY DAY, Disp: 30 tablet, Rfl: 0 .  clonazePAM (KLONOPIN) 1 MG tablet, Take 1 tablet (1 mg  total) by mouth 2 (two) times daily. NEEDS OFFICE VISIT FOR FURTHER REFILLS, Disp: 60 tablet, Rfl: 0 .  metoprolol succinate (TOPROL-XL) 50 MG 24 hr tablet, TAKE WITH OR IMMEDIATELY FOLLOWING A MEAL., Disp: 90 tablet, Rfl: 3 .  nitroGLYCERIN (NITROSTAT) 0.4 MG SL tablet, Place 1 tablet (0.4 mg total) under the tongue as needed for chest pain., Disp: 30 tablet, Rfl: 11 .  pantoprazole (PROTONIX) 40 MG tablet, TAKE 1 TABLET (40 MG TOTAL) BY MOUTH DAILY. NEEDS OFFICE VISIT FOR FURTHER REFILLS, Disp: 90 tablet, Rfl: 0 .  sildenafil (REVATIO) 20  MG tablet, Take 2 to 5 tablets one hour prior to sexual activity., Disp: 90 tablet, Rfl: 1  EXAM:  VITALS per patient if applicable:  GENERAL: alert, oriented, appears well and in no acute distress  HEENT: atraumatic, conjunttiva clear, no obvious abnormalities on inspection of external nose and ears  NECK: normal movements of the head and neck  LUNGS: on inspection no signs of respiratory distress, breathing rate appears normal, no obvious gross SOB, gasping or wheezing  CV: no obvious cyanosis  MS: moves all visible extremities without noticeable abnormality  PSYCH/NEURO: pleasant and cooperative, no obvious depression or anxiety, speech and thought processing grossly intact  ASSESSMENT AND PLAN:  Discussed the following assessment and plan:  #1 history of CAD.  Had previous bypass graft.  Remains on aspirin and Lipitor 80 mg daily. -Needs follow-up labs.  Place future order with lipid and hepatic panel  #2 hyperlipidemia.  Goal LDL less than 70.  Future labs as above  #3 erectile dysfunction -Refill Revatio.  He is reminded to never mix this with nitroglycerin.  He has never used nitroglycerin previously and is aware of these cannot be combined  #4 GERD controlled with pantoprazole.  He has tried tapering off in the past without success. -Refill Protonix 40 mg once daily  #5 chronic anxiety.  Has been for years on Klonopin.  We discussed concerns with chronic benzodiazepine especially long-acting.  -We suggested he try tapering back to half tablet twice daily and hopefully taper back even further     I discussed the assessment and treatment plan with the patient. The patient was provided an opportunity to ask questions and all were answered. The patient agreed with the plan and demonstrated an understanding of the instructions.   The patient was advised to call back or seek an in-person evaluation if the symptoms worsen or if the condition fails to improve as  anticipated.     Carolann Littler, MD

## 2020-04-28 ENCOUNTER — Other Ambulatory Visit: Payer: Self-pay | Admitting: Family Medicine

## 2020-04-29 NOTE — Telephone Encounter (Signed)
Last office visit- 10/02/19 Last refill- 10/02/19-60 tabs with 5 refills  No future appointment has been scheduled

## 2020-05-03 NOTE — Telephone Encounter (Signed)
Patient works out of town Monday-Friday.   Appointment scheduled for June 17th at 3.30.

## 2020-05-16 ENCOUNTER — Ambulatory Visit (INDEPENDENT_AMBULATORY_CARE_PROVIDER_SITE_OTHER): Payer: 59 | Admitting: Family Medicine

## 2020-05-16 ENCOUNTER — Other Ambulatory Visit: Payer: Self-pay

## 2020-05-16 ENCOUNTER — Encounter: Payer: Self-pay | Admitting: Family Medicine

## 2020-05-16 VITALS — BP 130/82 | HR 78 | Temp 98.3°F | Wt 206.9 lb

## 2020-05-16 DIAGNOSIS — E785 Hyperlipidemia, unspecified: Secondary | ICD-10-CM

## 2020-05-16 DIAGNOSIS — I10 Essential (primary) hypertension: Secondary | ICD-10-CM | POA: Diagnosis not present

## 2020-05-16 DIAGNOSIS — Z720 Tobacco use: Secondary | ICD-10-CM | POA: Diagnosis not present

## 2020-05-16 DIAGNOSIS — Z86010 Personal history of colonic polyps: Secondary | ICD-10-CM

## 2020-05-16 DIAGNOSIS — Z125 Encounter for screening for malignant neoplasm of prostate: Secondary | ICD-10-CM

## 2020-05-16 MED ORDER — CLONAZEPAM 1 MG PO TABS
1.0000 mg | ORAL_TABLET | Freq: Two times a day (BID) | ORAL | 2 refills | Status: DC
Start: 2020-05-16 — End: 2021-01-17

## 2020-05-16 MED ORDER — SILDENAFIL CITRATE 20 MG PO TABS: ORAL_TABLET | ORAL | 1 refills | Status: DC

## 2020-05-16 MED ORDER — METOPROLOL SUCCINATE ER 50 MG PO TB24: ORAL_TABLET | ORAL | 3 refills | Status: DC

## 2020-05-16 MED ORDER — ATORVASTATIN CALCIUM 80 MG PO TABS: 80.0000 mg | ORAL_TABLET | Freq: Every day | ORAL | 3 refills | Status: DC

## 2020-05-16 NOTE — Progress Notes (Signed)
Established Patient Office Visit  Subjective:  Patient ID: [name] [name], male    DOB: 1965/10/10  Age: 55 y.o. MRN: [id number]  CC:  Chief Complaint  Patient presents with  . Follow-up    Follow up on medications    HPI [name] [name] presents for medical follow-up.  Not seen here in quite some time.  His past medical history significant for CAD with CABG 2017, hyperlipidemia, history of GERD, hypertension, history of adenomatous colon polyps.  Ongoing nicotine use.  Greater than 30-pack-year history of smoking.  No active reflux symptoms.  He takes Protonix 40 mg daily and apparently been on this for several years.  He remains on Lipitor, aspirin, and metoprolol regarding his heart disease.  He takes Revatio for erectile dysfunction.  No recent chest pains.  No consistent exercise.  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  Past Medical History:  Diagnosis Date  . Acute bronchitis 03/31/2009  . ANXIETY 11/10/2008  . Arthritis   . Coronary artery disease   . EPIGASTRIC PAIN 12/08/2009  . GERD (gastroesophageal reflux disease)   . 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 Other and unspecified hyperlipidemia 12/29/2008    Past Surgical History:  Procedure Laterality Date  . CARDIAC CATHETERIZATION N/A 08/11/2015   Procedure: Left Heart Cath and Coronary Angiography;  Surgeon: Nelva Bush, MD;  Location: [place] [facility];  Service: Cardiovascular;  Laterality: N/A;  . COLONOSCOPY    . CORONARY ARTERY BYPASS GRAFT N/A 08/17/2015   Procedure: CORONARY ARTERY BYPASS GRAFTING (CABG) times five, LIMA to LAD, SVG to Diagonal sequentially SVG to OM-PL and Left Radial Artery to PD;  Surgeon: Melrose Nakayama, MD;  Location: [place];  Service: Open Heart Surgery;  Laterality: N/A;  . RADIAL ARTERY HARVEST Left 08/17/2015   Procedure: RADIAL ARTERY HARVEST;  Surgeon: Melrose Nakayama, MD;  Location: [place];  Service: Open Heart Surgery;  Laterality: Left;  . TEE  WITHOUT CARDIOVERSION N/A 08/17/2015   Procedure: TRANSESOPHAGEAL ECHOCARDIOGRAM (TEE);  Surgeon: Melrose Nakayama, MD;  Location: [place];  Service: Open Heart Surgery;  Laterality: N/A;  . WISDOM TOOTH EXTRACTION  1989    Family History  Problem Relation Age of Onset  . Cancer Mother        brain ca  . Heart disease Father 41       CABG  . Heart disease Paternal Aunt   . Heart disease Paternal Uncle   . Diabetes Brother   . Stroke Neg Hx        family hx  . Colon cancer Neg Hx     Social History   Socioeconomic History  . Marital status: Divorced    Spouse name: Not on file  . Number of children: Not on file  . Years of education: Not on file  . Highest education level: Not on file  Occupational History  . Not on file  Tobacco Use  . Smoking status: Current Every Day Smoker    Packs/day: 1.00    Years: 8.00    Pack years: 8.00    Types: Cigarettes    Last attempt to quit: 08/14/2015    Years since quitting: 4.7  . Smokeless tobacco: Never Used  Vaping Use  . Vaping Use: Never used  Substance and Sexual Activity  . Alcohol use: Yes    Alcohol/week: 3.0 standard drinks    Types: 3 Cans of beer per week    Comment: social  .  Drug use: No  . Sexual activity: Not on file  Other Topics Concern  . Not on file  Social History Narrative  . Not on file   Social Determinants of Health   Financial Resource Strain: Not on file  Food Insecurity: Not on file  Transportation Needs: Not on file  Physical Activity: Not on file  Stress: Not on file  Social Connections: Not on file  Intimate Partner Violence: Not on file    Outpatient Medications Prior to Visit  Medication Sig Dispense Refill  . aspirin 81 MG tablet Take 1 tablet (81 mg total) by mouth daily.    Marland Kitchen atorvastatin (LIPITOR) 80 MG tablet Take 1 tablet (80 mg total) by mouth daily. 90 tablet 1  . clonazePAM (KLONOPIN) 1 MG tablet Take 1 tablet (1 mg total) by mouth 2 (two) times daily. NEEDS OFFICE VISIT  FOR FURTHER REFILLS 60 tablet 5  . metoprolol succinate (TOPROL-XL) 50 MG 24 hr tablet TAKE WITH OR IMMEDIATELY FOLLOWING A MEAL. 90 tablet 3  . nitroGLYCERIN (NITROSTAT) 0.4 MG SL tablet Place 1 tablet (0.4 mg total) under the tongue as needed for chest pain. 30 tablet 11  . pantoprazole (PROTONIX) 40 MG tablet Take 1 tablet (40 mg total) by mouth daily. NEEDS OFFICE VISIT FOR FURTHER REFILLS 90 tablet 1  . sildenafil (REVATIO) 20 MG tablet Take 2 to 5 tablets one hour prior to sexual activity. 90 tablet 1   No facility-administered medications prior to visit.    No Known Allergies  ROS Review of Systems  Constitutional: Negative for fatigue and unexpected weight change.  Eyes: Negative for visual disturbance.  Respiratory: Negative for cough, chest tightness and shortness of breath.   Cardiovascular: Negative for chest pain, palpitations and leg swelling.  Gastrointestinal: Negative for abdominal pain.  Endocrine: Negative for polydipsia and polyuria.  Neurological: Negative for dizziness, syncope, weakness, light-headedness and headaches.  Psychiatric/Behavioral: The patient is nervous/anxious.       Objective:    Physical Exam Constitutional:      Appearance: He is well-developed.  HENT:     Right Ear: External ear normal.     Left Ear: External ear normal.  Eyes:     Pupils: Pupils are equal, round, and reactive to light.  Neck:     Thyroid: No thyromegaly.  Cardiovascular:     Rate and Rhythm: Normal rate and regular rhythm.  Pulmonary:     Effort: Pulmonary effort is normal. No respiratory distress.     Breath sounds: Normal breath sounds. No wheezing or rales.  Musculoskeletal:     Cervical back: Neck supple.  Neurological:     Mental Status: He is alert and oriented to person, place, and time.     BP 130/82 (BP Location: Right Arm, Cuff Size: Normal)   Pulse 78   Temp 98.3 F (36.8 C) (Oral)   Wt 206 lb 14.4 oz (93.8 kg)   SpO2 93%   BMI 28.06 kg/m  Wt  Readings from Last 3 Encounters:  05/16/20 206 lb 14.4 oz (93.8 kg)  10/02/19 195 lb (88.5 kg)  06/13/18 204 lb 9.6 oz (92.8 kg)     Health Maintenance Due  Topic Date Due  . HIV Screening  Never done  . Hepatitis C Screening  Never done  . COVID-19 Vaccine (2 - Pfizer 3-dose series) 02/14/2020  . COLONOSCOPY (Pts 45-54yrs Insurance coverage will need to be confirmed)  02/21/2020    There are no preventive care reminders to  display for this patient.  Lab Results  Component Value Date   TSH 1.02 06/13/2018   Lab Results  Component Value Date   WBC 5.8 06/13/2018   HGB 14.9 06/13/2018   HCT 43.7 06/13/2018   MCV 91.3 06/13/2018   PLT 210.0 06/13/2018   Lab Results  Component Value Date   NA 139 06/13/2018   K 4.6 06/13/2018   CO2 28 06/13/2018   GLUCOSE 75 06/13/2018   BUN 11 06/13/2018   CREATININE 1.10 06/13/2018   BILITOT 0.4 06/13/2018   ALKPHOS 71 06/13/2018   AST 21 06/13/2018   ALT 21 06/13/2018   PROT 6.8 06/13/2018   ALBUMIN 4.3 06/13/2018   CALCIUM 9.5 06/13/2018   ANIONGAP 5 08/20/2015   GFR 69.74 06/13/2018   Lab Results  Component Value Date   CHOL 137 06/13/2018   Lab Results  Component Value Date   HDL 42.40 06/13/2018   Lab Results  Component Value Date   LDLCALC 69 06/13/2018   Lab Results  Component Value Date   TRIG 129.0 06/13/2018   Lab Results  Component Value Date   CHOLHDL 3 06/13/2018   Lab Results  Component Value Date   HGBA1C 5.2 08/16/2015      Assessment & Plan:   #1 history of CAD/hyperlipidemia.  Goal LDL less than 70. -Needs follow-up lipid panel and hepatic panel -Continue high-dose statin therapy -He is encouraged to reconnect with follow-up with cardiology.  He has not seen them since the pandemic. -Refilled Lipitor for 1 year  #2 history of GERD.  Currently stable symptomatically. -We discussed possible tapering off Protonix and he will give this a try  #3 ongoing nicotine use.  Strongly encouraged  to stop smoking altogether.  Current motivation low.  We also discussed low-dose CT lung cancer screening but he is not interested at this time.  #4 history of adenomatous colon polyp.  Patient due for repeat colonoscopy this year.  We offered to make referral for follow-up but he is not interested because of work schedule.  He plans to call back with available times.  #5 longstanding history of chronic anxiety.  Has been on Klonopin for years.  We discussed concerns with long-term use including possible concerns with memory loss over time and even dementia especially with long acting benzodiazepines and also increased risk of falls with aging.  He would plan to try to cut back to half tablet twice daily and then try to taper off  #6 other health maintenance.  Discussed prostate cancer screening.  Patient consents.  Check PSA today  No orders of the defined types were placed in this encounter.   Follow-up: No follow-ups on file.    Carolann Littler, MD

## 2020-05-17 LAB — HEPATIC FUNCTION PANEL
ALT: 20 U/L (ref 0–53)
AST: 23 U/L (ref 0–37)
Albumin: 4.6 g/dL (ref 3.5–5.2)
Alkaline Phosphatase: 70 U/L (ref 39–117)
Bilirubin, Direct: 0.1 mg/dL (ref 0.0–0.3)
Total Bilirubin: 0.5 mg/dL (ref 0.2–1.2)
Total Protein: 7.4 g/dL (ref 6.0–8.3)

## 2020-05-17 LAB — LIPID PANEL
Cholesterol: 155 mg/dL (ref 0–200)
HDL: 50.2 mg/dL (ref >39.00)
LDL Cholesterol: 84 mg/dL (ref 0–99)
NonHDL: 104.82
Total CHOL/HDL Ratio: 3
Triglycerides: 102 mg/dL (ref 0.0–149.0)
VLDL: 20.4 mg/dL (ref 0.0–40.0)

## 2020-05-17 LAB — BASIC METABOLIC PANEL
BUN: 16 mg/dL (ref 6–23)
CO2: 27 mEq/L (ref 19–32)
Calcium: 9.9 mg/dL (ref 8.4–10.5)
Chloride: 101 mEq/L (ref 96–112)
Creatinine, Ser: 1.11 mg/dL (ref 0.40–1.50)
GFR: 74.5 mL/min (ref >60.00)
Glucose, Bld: 89 mg/dL (ref 70–99)
Potassium: 4.4 mEq/L (ref 3.5–5.1)
Sodium: 137 mEq/L (ref 135–145)

## 2020-05-17 LAB — PSA: PSA: 0.81 ng/mL (ref 0.10–4.00)

## 2020-06-08 ENCOUNTER — Other Ambulatory Visit: Payer: Self-pay | Admitting: Family Medicine

## 2020-06-17 ENCOUNTER — Ambulatory Visit: Payer: Self-pay | Admitting: Family Medicine

## 2020-12-02 ENCOUNTER — Other Ambulatory Visit: Payer: Self-pay | Admitting: Family Medicine

## 2021-01-17 ENCOUNTER — Telehealth: Payer: Self-pay | Admitting: Family Medicine

## 2021-01-17 MED ORDER — CLONAZEPAM 1 MG PO TABS: 1.0000 mg | ORAL_TABLET | Freq: Two times a day (BID) | ORAL | 2 refills | Status: DC

## 2021-01-17 NOTE — Telephone Encounter (Signed)
Last filled 05/16/2020 Last OV 05/16/2020  Ok to fill?

## 2021-01-17 NOTE — Telephone Encounter (Signed)
Pt wife valerie is calling and her husband has new pharm and needs new rx clonazePAM (KLONOPIN) 1 MG tablet sent to  [facility], [place] AT [address] Phone:  [phone]  Fax:  [phone]

## 2021-01-20 ENCOUNTER — Other Ambulatory Visit: Payer: Self-pay | Admitting: Family Medicine

## 2021-04-05 ENCOUNTER — Encounter: Payer: Self-pay | Admitting: Gastroenterology

## 2021-04-18 ENCOUNTER — Telehealth: Payer: Self-pay | Admitting: Family Medicine

## 2021-04-18 MED ORDER — PANTOPRAZOLE SODIUM 40 MG PO TBEC: 40.0000 mg | DELAYED_RELEASE_TABLET | Freq: Every day | ORAL | 0 refills | Status: DC

## 2021-04-18 NOTE — Telephone Encounter (Signed)
Rx sent 

## 2021-04-18 NOTE — Telephone Encounter (Signed)
Patient's wife called because patient needs a refill on pantoprazole (PROTONIX) 40 MG tablet ? ? ?Patient is completley out  ? ? ? ?Please send to  ? ?[facility], [place] - [address] [place] [address] Phone:  [phone]  ?Fax:  [phone]  ?  ? ? ? ? ?Please advise  ?

## 2021-07-16 ENCOUNTER — Other Ambulatory Visit: Payer: Self-pay | Admitting: Family Medicine

## 2021-07-17 NOTE — Telephone Encounter (Signed)
Refilled once only.  Needs office follow-up.

## 2021-08-06 ENCOUNTER — Other Ambulatory Visit: Payer: Self-pay | Admitting: Family Medicine

## 2021-08-10 ENCOUNTER — Telehealth: Payer: Self-pay | Admitting: Family Medicine

## 2021-08-10 NOTE — Telephone Encounter (Signed)
Last OV- 05/16/2020 Requesting refill for Clonazepam '1mg'$ .      Please advise if okay to send remaining requested medications for 30 day supply

## 2021-08-10 NOTE — Telephone Encounter (Signed)
Patient called because he needs the one month refill for atorvastatin (LIPITOR) 80 MG tablet  clonazePAM (KLONOPIN) 1 MG tablet  metoprolol succinate (TOPROL-XL) 50 MG 24 hr tablet  pantoprazole (PROTONIX) 40 MG tablet  sildenafil (REVATIO) 20 MG tablet  He will run out on Monday  He is scheduled for Friday, August 18th at 3:30 to come in office for a medication refill appointment   Patient would like them sent to   [facility], [place] Phone:  [phone]  Fax:  [phone]         Please advise

## 2021-08-11 ENCOUNTER — Other Ambulatory Visit: Payer: Self-pay

## 2021-08-11 DIAGNOSIS — I10 Essential (primary) hypertension: Secondary | ICD-10-CM

## 2021-08-11 MED ORDER — METOPROLOL SUCCINATE ER 50 MG PO TB24: ORAL_TABLET | ORAL | 0 refills | Status: DC

## 2021-08-11 MED ORDER — ATORVASTATIN CALCIUM 80 MG PO TABS: 80.0000 mg | ORAL_TABLET | Freq: Every day | ORAL | 0 refills | Status: DC

## 2021-08-11 MED ORDER — PANTOPRAZOLE SODIUM 40 MG PO TBEC: DELAYED_RELEASE_TABLET | ORAL | 0 refills | Status: DC

## 2021-08-11 MED ORDER — SILDENAFIL CITRATE 20 MG PO TABS: ORAL_TABLET | ORAL | 0 refills | Status: DC

## 2021-08-11 NOTE — Telephone Encounter (Signed)
Spoke with patient, requested refills sent to [facility].

## 2021-08-18 ENCOUNTER — Ambulatory Visit (INDEPENDENT_AMBULATORY_CARE_PROVIDER_SITE_OTHER): Payer: BC Managed Care – PPO | Admitting: Family Medicine

## 2021-08-18 ENCOUNTER — Encounter: Payer: Self-pay | Admitting: Family Medicine

## 2021-08-18 VITALS — BP 136/80 | Temp 98.1°F | Ht 72.0 in | Wt 202.7 lb

## 2021-08-18 DIAGNOSIS — I10 Essential (primary) hypertension: Secondary | ICD-10-CM | POA: Diagnosis not present

## 2021-08-18 DIAGNOSIS — F411 Generalized anxiety disorder: Secondary | ICD-10-CM | POA: Diagnosis not present

## 2021-08-18 DIAGNOSIS — E785 Hyperlipidemia, unspecified: Secondary | ICD-10-CM | POA: Diagnosis not present

## 2021-08-18 MED ORDER — PANTOPRAZOLE SODIUM 40 MG PO TBEC: DELAYED_RELEASE_TABLET | ORAL | 3 refills | Status: DC

## 2021-08-18 MED ORDER — SILDENAFIL CITRATE 20 MG PO TABS
ORAL_TABLET | ORAL | 1 refills | Status: DC
Start: 2021-08-18 — End: 2022-10-29

## 2021-08-18 MED ORDER — METOPROLOL SUCCINATE ER 50 MG PO TB24: ORAL_TABLET | ORAL | 3 refills | Status: DC

## 2021-08-18 MED ORDER — CLONAZEPAM 1 MG PO TABS: ORAL_TABLET | ORAL | 5 refills | Status: DC

## 2021-08-18 MED ORDER — ATORVASTATIN CALCIUM 80 MG PO TABS: 80.0000 mg | ORAL_TABLET | Freq: Every day | ORAL | 3 refills | Status: DC

## 2021-08-18 NOTE — Progress Notes (Signed)
Established Patient Office Visit  Subjective   Patient ID: [name] [name], male    DOB: 08-24-1965  Age: 56 y.o. MRN: [id number]  Chief Complaint  Patient presents with   Medication Refill    HPI   [name] [name] is here for medical follow-up.  He has history of hypertension, CAD, hyperlipidemia, ongoing nicotine use.  He had bypass several years ago.  No recent chest pains.  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  His current medications include aspirin, Lipitor 80 mg daily, Klonopin 1 mg twice daily, Toprol-XL 50 mg, and Protonix 40 mg daily.  Longstanding history of reflux currently controlled with Protonix.  Denies any recent chest pains.  Unfortunately still smoking.  Low motivation to quit.  Due for follow-up labs.  Does have history of ED.  No nitroglycerin use.  Has taken generic sildenafil in the past and requesting refill.  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   Other and unspecified hyperlipidemia 12/29/2008   Past Surgical History:  Procedure Laterality Date   CARDIAC CATHETERIZATION N/A 08/11/2015   Procedure: Left Heart Cath and Coronary Angiography;  Surgeon: Nelva Bush, MD;  Location: [place] [facility];  Service: Cardiovascular;  Laterality: N/A;   COLONOSCOPY     CORONARY ARTERY BYPASS GRAFT N/A 08/17/2015   Procedure: CORONARY ARTERY BYPASS GRAFTING (CABG) times five, LIMA to LAD, SVG to Diagonal sequentially SVG to OM-PL and Left Radial Artery to PD;  Surgeon: Melrose Nakayama, MD;  Location: [place];  Service: Open Heart Surgery;  Laterality: N/A;   RADIAL ARTERY HARVEST Left 08/17/2015   Procedure: RADIAL ARTERY HARVEST;  Surgeon: Melrose Nakayama, MD;  Location: [place];  Service:  Open Heart Surgery;  Laterality: Left;   TEE WITHOUT CARDIOVERSION N/A 08/17/2015   Procedure: TRANSESOPHAGEAL ECHOCARDIOGRAM (TEE);  Surgeon: Melrose Nakayama, MD;  Location: [facility];  Service: Open Heart Surgery;  Laterality: N/A;   [place]    reports that he has been smoking cigarettes. He has a 8.00 pack-year smoking history. He has never used smokeless tobacco. He reports current alcohol use of about 3.0 standard drinks of alcohol per week. He reports that he does not use drugs. family history includes Cancer in his mother; Diabetes in his brother; Heart disease in his paternal aunt and paternal uncle; Heart disease (age of onset: 4) in his father. No Known Allergies  Review of Systems  Constitutional:  Negative for malaise/fatigue.  Eyes:  Negative for blurred vision.  Respiratory:  Negative for shortness of breath.   Cardiovascular:  Negative for chest pain.  Gastrointestinal:  Negative for abdominal pain.  Neurological:  Negative for dizziness, weakness and headaches.      Objective:     BP 136/80 (BP Location: Right Arm, Cuff Size: Normal)   Temp 98.1 F (36.7 C) (Oral)   Ht 6' (1.829 m)   Wt 202 lb 11.2 oz (91.9 kg)   SpO2 96%   BMI 27.49 kg/m    Physical Exam Constitutional:      Appearance: He is well-developed.  HENT:     Right Ear: External ear normal.     Left Ear: External ear normal.  Eyes:  Pupils: Pupils are equal, round, and reactive to light.  Neck:     Thyroid: No thyromegaly.  Cardiovascular:     Rate and Rhythm: Normal rate and regular rhythm.  Pulmonary:     Effort: Pulmonary effort is normal. No respiratory distress.     Breath sounds: Normal breath sounds. No wheezing or rales.  Musculoskeletal:     Cervical back: Neck supple.     Right lower leg: No edema.     Left lower leg: No edema.  Neurological:     Mental Status: He is alert and oriented to person, place, and time.      No results found for any  visits on 08/18/21.    The 10-year ASCVD risk score (Arnett DK, et al., 2019) is: 11.5%    Assessment & Plan:   Problem List Items Addressed This Visit       Unprioritized   Hypertension - Primary   Relevant Medications   atorvastatin (LIPITOR) 80 MG tablet   metoprolol succinate (TOPROL-XL) 50 MG 24 hr tablet   sildenafil (REVATIO) 20 MG tablet   Other Relevant Orders   Basic metabolic panel   Anxiety state   Hyperlipidemia   Relevant Medications   atorvastatin (LIPITOR) 80 MG tablet   metoprolol succinate (TOPROL-XL) 50 MG 24 hr tablet   sildenafil (REVATIO) 20 MG tablet   Other Relevant Orders   Basic metabolic panel   Hepatic function panel   Lipid panel  -Strongly advised to stop smoking -Refilled medications including atorvastatin, Toprol-XL, and Revatio -Recheck labs as above with lipid panel, hepatic panel, basic metabolic panel -He has longstanding history of anxiety and has been on Klonopin for years.  We refilled for 6 months. -Initial blood pressure here was up but did come down after rest  No follow-ups on file.    Carolann Littler, MD

## 2021-08-19 LAB — HEPATIC FUNCTION PANEL
AG Ratio: 1.8 (calc) (ref 1.0–2.5)
ALT: 19 U/L (ref 9–46)
AST: 28 U/L (ref 10–35)
Albumin: 4.5 g/dL (ref 3.6–5.1)
Alkaline phosphatase (APISO): 68 U/L (ref 35–144)
Bilirubin, Direct: 0.1 mg/dL (ref 0.0–0.2)
Globulin: 2.5 g/dL (calc) (ref 1.9–3.7)
Indirect Bilirubin: 0.5 mg/dL (calc) (ref 0.2–1.2)
Total Bilirubin: 0.6 mg/dL (ref 0.2–1.2)
Total Protein: 7 g/dL (ref 6.1–8.1)

## 2021-08-19 LAB — LIPID PANEL
Cholesterol: 134 mg/dL (ref <200)
HDL: 48 mg/dL (ref >=40)
LDL Cholesterol (Calc): 71 mg/dL (calc)
Non-HDL Cholesterol (Calc): 86 mg/dL (calc) (ref <130)
Total CHOL/HDL Ratio: 2.8 (calc) (ref <5.0)
Triglycerides: 72 mg/dL (ref <150)

## 2021-08-19 LAB — BASIC METABOLIC PANEL
BUN: 12 mg/dL (ref 7–25)
CO2: 28 mmol/L (ref 20–32)
Calcium: 10.1 mg/dL (ref 8.6–10.3)
Chloride: 104 mmol/L (ref 98–110)
Creat: 1.23 mg/dL (ref 0.70–1.30)
Glucose, Bld: 94 mg/dL (ref 65–99)
Potassium: 4.7 mmol/L (ref 3.5–5.3)
Sodium: 140 mmol/L (ref 135–146)

## 2022-01-08 DIAGNOSIS — R52 Pain, unspecified: Secondary | ICD-10-CM | POA: Diagnosis not present

## 2022-01-08 DIAGNOSIS — R051 Acute cough: Secondary | ICD-10-CM | POA: Diagnosis not present

## 2022-01-08 DIAGNOSIS — R509 Fever, unspecified: Secondary | ICD-10-CM | POA: Diagnosis not present

## 2022-02-01 DIAGNOSIS — J069 Acute upper respiratory infection, unspecified: Secondary | ICD-10-CM | POA: Diagnosis not present

## 2022-02-16 ENCOUNTER — Other Ambulatory Visit: Payer: Self-pay | Admitting: Family Medicine

## 2022-09-05 ENCOUNTER — Other Ambulatory Visit: Payer: Self-pay | Admitting: Family Medicine

## 2022-09-06 ENCOUNTER — Other Ambulatory Visit: Payer: Self-pay | Admitting: Family Medicine

## 2022-09-06 DIAGNOSIS — I10 Essential (primary) hypertension: Secondary | ICD-10-CM

## 2022-09-06 NOTE — Telephone Encounter (Signed)
Patient's wife is calling in for a 90-day refill on Metoprolol --pt has 3 pills left.  He will be out of town as of Sunday.  Pt's wife is asking for a call back please.  Pharmacy- [facility]- [address]/[place], [place]

## 2022-10-08 ENCOUNTER — Other Ambulatory Visit: Payer: Self-pay | Admitting: Family Medicine

## 2022-10-08 DIAGNOSIS — I10 Essential (primary) hypertension: Secondary | ICD-10-CM

## 2022-10-09 NOTE — Telephone Encounter (Signed)
Spoke to pt. Inform he has not seen with Dr. Caryl Never for over a year. And see if can schedule an appt.   Pt states he is not able to at the moment due his work schedule. He is working in [place].   Pt states he will work on scheduling his appt. Inform him 30 days supply will be sent.

## 2022-10-09 NOTE — Telephone Encounter (Signed)
Pt's spouse called very worried because Pt only has 2 pills left of the metoprolol succinate metoprolol succinate (TOPROL-XL) 50 MG 24 hr tablet  Please send refill, as soon as possible to:  [id number] - [place], [place] - [address] AT [facility] [address] Phone: [phone]  Fax: [phone]

## 2022-10-22 ENCOUNTER — Ambulatory Visit: Payer: BC Managed Care – PPO | Admitting: Family Medicine

## 2022-10-29 ENCOUNTER — Ambulatory Visit: Payer: BC Managed Care – PPO | Admitting: Family Medicine

## 2022-10-29 ENCOUNTER — Encounter: Payer: Self-pay | Admitting: Family Medicine

## 2022-10-29 VITALS — BP 142/80 | HR 67 | Temp 98.0°F | Ht 72.0 in | Wt 197.1 lb

## 2022-10-29 DIAGNOSIS — Z720 Tobacco use: Secondary | ICD-10-CM

## 2022-10-29 DIAGNOSIS — I10 Essential (primary) hypertension: Secondary | ICD-10-CM

## 2022-10-29 DIAGNOSIS — K219 Gastro-esophageal reflux disease without esophagitis: Secondary | ICD-10-CM

## 2022-10-29 DIAGNOSIS — E785 Hyperlipidemia, unspecified: Secondary | ICD-10-CM

## 2022-10-29 DIAGNOSIS — Z951 Presence of aortocoronary bypass graft: Secondary | ICD-10-CM | POA: Diagnosis not present

## 2022-10-29 DIAGNOSIS — Z23 Encounter for immunization: Secondary | ICD-10-CM

## 2022-10-29 DIAGNOSIS — Z125 Encounter for screening for malignant neoplasm of prostate: Secondary | ICD-10-CM

## 2022-10-29 LAB — COMPREHENSIVE METABOLIC PANEL
ALT: 21 U/L (ref 0–53)
AST: 22 U/L (ref 0–37)
Albumin: 4.3 g/dL (ref 3.5–5.2)
Alkaline Phosphatase: 69 U/L (ref 39–117)
BUN: 13 mg/dL (ref 6–23)
CO2: 30 meq/L (ref 19–32)
Calcium: 9.6 mg/dL (ref 8.4–10.5)
Chloride: 102 meq/L (ref 96–112)
Creatinine, Ser: 1.12 mg/dL (ref 0.40–1.50)
GFR: 72.45 mL/min (ref >60.00)
Glucose, Bld: 95 mg/dL (ref 70–99)
Potassium: 4.3 meq/L (ref 3.5–5.1)
Sodium: 139 meq/L (ref 135–145)
Total Bilirubin: 0.6 mg/dL (ref 0.2–1.2)
Total Protein: 6.9 g/dL (ref 6.0–8.3)

## 2022-10-29 LAB — LIPID PANEL
Cholesterol: 133 mg/dL (ref 0–200)
HDL: 65 mg/dL
LDL Cholesterol: 58 mg/dL (ref 0–99)
NonHDL: 67.88
Total CHOL/HDL Ratio: 2
Triglycerides: 49 mg/dL (ref 0.0–149.0)
VLDL: 9.8 mg/dL (ref 0.0–40.0)

## 2022-10-29 LAB — PSA: PSA: 0.76 ng/mL (ref 0.10–4.00)

## 2022-10-29 MED ORDER — SILDENAFIL CITRATE 20 MG PO TABS: ORAL_TABLET | ORAL | 1 refills | Status: DC

## 2022-10-29 MED ORDER — PANTOPRAZOLE SODIUM 40 MG PO TBEC: DELAYED_RELEASE_TABLET | ORAL | 3 refills | Status: DC

## 2022-10-29 MED ORDER — ATORVASTATIN CALCIUM 80 MG PO TABS: ORAL_TABLET | ORAL | 3 refills | Status: DC

## 2022-10-29 MED ORDER — METOPROLOL SUCCINATE ER 50 MG PO TB24: ORAL_TABLET | ORAL | 3 refills | Status: DC

## 2022-10-29 NOTE — Progress Notes (Signed)
Established Patient Office Visit  Subjective   Patient ID: [name] [name], male    DOB: 07-11-1965  Age: 57 y.o. MRN: [id number]  Chief Complaint  Patient presents with   Medication Consultation    HPI   Angie is seen for medical follow-up.  Doing fairly well.  He and his wife went on a modified keto type diet a year ago.  He is currently working mostly down in [place] and comes home for the weekends.  Banker projects.  Denies any recent chest pains.  Still smoking about 1 pack cigarettes per day.  He has history of CAD with bypass 2017.  Medications include atorvastatin 80 mg daily, Toprol-XL 50 mg daily, Protonix 40 mg daily, and aspirin 81 mg daily.  He has taken Revatio intermittently in the past for ED and knows that this cannot be mixed with nitroglycerin.  He has not taken any nitroglycerin in 5 years.  He states he is compliant with medications.  Still smokes as above.  Low motivation to quit.  Does consent today to flu vaccine.  Blood pressure was up initially but he states this is consistent from past experience.  He has been consistently better controlled at home.  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   Other and unspecified hyperlipidemia 12/29/2008   Past Surgical History:  Procedure Laterality Date   CARDIAC CATHETERIZATION N/A 08/11/2015   Procedure: Left Heart Cath and Coronary Angiography;  Surgeon: Yvonne Kendall, MD;  Location: [facility];  Service: Cardiovascular;  Laterality: N/A;   COLONOSCOPY     CORONARY ARTERY BYPASS GRAFT N/A 08/17/2015   Procedure: CORONARY ARTERY BYPASS GRAFTING (CABG) times five, LIMA to LAD, SVG to Diagonal sequentially SVG to OM-PL and Left Radial Artery to PD;  Surgeon: Loreli Slot, MD;  Location: [facility]  OR;  Service: Open Heart Surgery;  Laterality: N/A;   RADIAL ARTERY HARVEST Left 08/17/2015   Procedure: RADIAL ARTERY HARVEST;  Surgeon: Loreli Slot, MD;  Location: [facility] OR;  Service: Open Heart Surgery;  Laterality: Left;   TEE WITHOUT CARDIOVERSION N/A 08/17/2015   Procedure: TRANSESOPHAGEAL ECHOCARDIOGRAM (TEE);  Surgeon: Loreli Slot, MD;  Location: [facility] OR;  Service: Open Heart Surgery;  Laterality: N/A;   WISDOM TOOTH EXTRACTION  1989    reports that he has been smoking cigarettes. He started smoking about 15 years ago. He has a 8 pack-year smoking history. He has never used smokeless tobacco. He reports current alcohol use of about 3.0 standard drinks of alcohol per week. He reports that he does not use drugs. family history includes Cancer in his mother; Diabetes in his brother; Heart disease in his paternal aunt and paternal uncle; Heart disease (age of onset: 14) in his father. No Known Allergies  Review of Systems  Constitutional:  Negative for chills, fever and malaise/fatigue.  Eyes:  Negative for blurred vision.  Respiratory:  Negative for shortness of breath.   Cardiovascular:  Negative for chest pain.  Gastrointestinal:  Negative for abdominal pain.  Neurological:  Negative for dizziness, weakness and headaches.      Objective:     BP (!) 142/80 (BP Location: Left Arm, Cuff Size: Normal)   Pulse 67   Temp 98 F (36.7 C) (Oral)  Ht 6' (1.829 m)   Wt 197 lb 1.6 oz (89.4 kg)   SpO2 100%   BMI 26.73 kg/m  BP Readings from Last 3 Encounters:  10/29/22 (!) 142/80  08/18/21 136/80  05/16/20 130/82   Wt Readings from Last 3 Encounters:  10/29/22 197 lb 1.6 oz (89.4 kg)  08/18/21 202 lb 11.2 oz (91.9 kg)  05/16/20 206 lb 14.4 oz (93.8 kg)      Physical Exam Vitals reviewed.  Constitutional:      Appearance: He is well-developed.  HENT:     Right Ear: External ear normal.     Left Ear: External ear normal.  Eyes:     Pupils: Pupils are equal,  round, and reactive to light.  Neck:     Thyroid: No thyromegaly.  Cardiovascular:     Rate and Rhythm: Normal rate and regular rhythm.  Pulmonary:     Effort: Pulmonary effort is normal. No respiratory distress.     Breath sounds: Normal breath sounds. No wheezing or rales.  Musculoskeletal:     Cervical back: Neck supple.     Right lower leg: No edema.     Left lower leg: No edema.  Neurological:     Mental Status: He is alert and oriented to person, place, and time.      No results found for any visits on 10/29/22.  Last CBC Lab Results  Component Value Date   WBC 5.8 06/13/2018   HGB 14.9 06/13/2018   HCT 43.7 06/13/2018   MCV 91.3 06/13/2018   MCH 31.2 02/26/2017   RDW 13.1 06/13/2018   PLT 210.0 06/13/2018   Last metabolic panel Lab Results  Component Value Date   GLUCOSE 94 08/18/2021   NA 140 08/18/2021   K 4.7 08/18/2021   CL 104 08/18/2021   CO2 28 08/18/2021   BUN 12 08/18/2021   CREATININE 1.23 08/18/2021   GFR 74.50 05/16/2020   CALCIUM 10.1 08/18/2021   PROT 7.0 08/18/2021   ALBUMIN 4.6 05/16/2020   LABGLOB 2.5 02/26/2017   AGRATIO 1.7 02/26/2017   BILITOT 0.6 08/18/2021   ALKPHOS 70 05/16/2020   AST 28 08/18/2021   ALT 19 08/18/2021   ANIONGAP 5 08/20/2015   Last lipids Lab Results  Component Value Date   CHOL 134 08/18/2021   HDL 48 08/18/2021   LDLCALC 71 08/18/2021   LDLDIRECT 157.6 12/21/2008   TRIG 72 08/18/2021   CHOLHDL 2.8 08/18/2021      The 10-year ASCVD risk score (Arnett DK, et al., 2019) is: 11.9%    Assessment & Plan:   #1 history of CAD with CABG 2017.  He is strongly encouraged to stop smoking.  Recheck lipid panel today along with CMP.  Continue high-dose statin along with aspirin.  He also remains on beta-blocker.  No recent chest pains.  Consider getting back and reestablish with cardiology soon  #2 hyperlipidemia treated with atorvastatin.  Recheck fasting lipids today with goal LDL less than 55 refilled  atorvastatin for 1 year  #3 history of ADD.  Refilled Revatio with clear instructions to not mix with nitroglycerin.  He is aware.  #4 ongoing nicotine use.  Discussed low-dose CT lung cancer screening.  He is not interested at this time.  Flu vaccine given.  #5 GERD controlled with Protonix 40 mg daily.  Refilled for 1 year   No follow-ups on file.    Evelena Peat, MD

## 2022-10-29 NOTE — Patient Instructions (Signed)
Monitor home blood pressures and be in touch if consistently > 140/90

## 2022-11-02 ENCOUNTER — Other Ambulatory Visit: Payer: Self-pay | Admitting: Family Medicine

## 2022-11-02 NOTE — Telephone Encounter (Signed)
Prescription Request  11/02/2022  LOV: 10/29/2022  What is the name of the medication or equipment? clonazePAM clonazePAM (KLONOPIN) 1 MG tablet  They are asking for a 90 day supply. Pt only has a few pills left.   Have you contacted your pharmacy to request a refill? No   Which pharmacy would you like this sent to?  [facility] #[id number] - [place], [place] - [address] [place] [address] Phone: [phone] Fax: [phone]    Patient notified that their request is being sent to the clinical staff for review and that they should receive a response within 2 business days.   Please advise at Mobile [phone] (mobile)

## 2022-11-04 MED ORDER — CLONAZEPAM 1 MG PO TABS: ORAL_TABLET | ORAL | 5 refills | Status: DC

## 2022-11-26 ENCOUNTER — Other Ambulatory Visit: Payer: Self-pay | Admitting: Family Medicine

## 2022-11-26 NOTE — Telephone Encounter (Signed)
Patient needs a refill on his Nitroglycerin.  Patient hasn't refilled it since 03/04/2018.  He didn't realize it was expired but he just had an appointment recently with Dr. Caryl Never.

## 2022-11-27 MED ORDER — NITROGLYCERIN 0.4 MG SL SUBL: 0.4000 mg | SUBLINGUAL_TABLET | SUBLINGUAL | 0 refills | Status: DC | PRN

## 2023-07-15 ENCOUNTER — Other Ambulatory Visit: Payer: Self-pay | Admitting: Family Medicine

## 2023-10-07 ENCOUNTER — Ambulatory Visit: Admitting: Family Medicine

## 2023-10-07 ENCOUNTER — Ambulatory Visit: Payer: Self-pay | Admitting: Family Medicine

## 2023-10-07 VITALS — BP 140/80 | HR 65 | Temp 98.5°F | Ht 70.08 in | Wt 199.2 lb

## 2023-10-07 DIAGNOSIS — Z23 Encounter for immunization: Secondary | ICD-10-CM | POA: Diagnosis not present

## 2023-10-07 DIAGNOSIS — Z0000 Encounter for general adult medical examination without abnormal findings: Secondary | ICD-10-CM

## 2023-10-07 DIAGNOSIS — I10 Essential (primary) hypertension: Secondary | ICD-10-CM | POA: Diagnosis not present

## 2023-10-07 DIAGNOSIS — E785 Hyperlipidemia, unspecified: Secondary | ICD-10-CM

## 2023-10-07 LAB — LIPID PANEL
Cholesterol: 141 mg/dL (ref 0–200)
HDL: 50.8 mg/dL (ref >39.00)
LDL Cholesterol: 77 mg/dL (ref 0–99)
NonHDL: 90.67
Total CHOL/HDL Ratio: 3
Triglycerides: 67 mg/dL (ref 0.0–149.0)
VLDL: 13.4 mg/dL (ref 0.0–40.0)

## 2023-10-07 LAB — COMPREHENSIVE METABOLIC PANEL WITH GFR
ALT: 26 U/L (ref 0–53)
AST: 26 U/L (ref 0–37)
Albumin: 4.5 g/dL (ref 3.5–5.2)
Alkaline Phosphatase: 73 U/L (ref 39–117)
BUN: 21 mg/dL (ref 6–23)
CO2: 26 meq/L (ref 19–32)
Calcium: 10 mg/dL (ref 8.4–10.5)
Chloride: 101 meq/L (ref 96–112)
Creatinine, Ser: 1.25 mg/dL (ref 0.40–1.50)
GFR: 63.09 mL/min (ref >60.00)
Glucose, Bld: 103 mg/dL — ABNORMAL HIGH (ref 70–99)
Potassium: 5.5 meq/L — ABNORMAL HIGH (ref 3.5–5.1)
Sodium: 139 meq/L (ref 135–145)
Total Bilirubin: 0.6 mg/dL (ref 0.2–1.2)
Total Protein: 7.3 g/dL (ref 6.0–8.3)

## 2023-10-07 LAB — PSA: PSA: 1.2 ng/mL (ref 0.10–4.00)

## 2023-10-07 MED ORDER — SILDENAFIL CITRATE 20 MG PO TABS: ORAL_TABLET | ORAL | 1 refills | Status: AC

## 2023-10-07 MED ORDER — NITROGLYCERIN 0.4 MG SL SUBL: 0.4000 mg | SUBLINGUAL_TABLET | SUBLINGUAL | 0 refills | Status: AC | PRN

## 2023-10-07 MED ORDER — ATORVASTATIN CALCIUM 80 MG PO TABS: ORAL_TABLET | ORAL | 3 refills | Status: AC

## 2023-10-07 MED ORDER — PANTOPRAZOLE SODIUM 40 MG PO TBEC: DELAYED_RELEASE_TABLET | ORAL | 3 refills | Status: AC

## 2023-10-07 MED ORDER — METOPROLOL SUCCINATE ER 50 MG PO TB24: ORAL_TABLET | ORAL | 3 refills | Status: AC

## 2023-10-07 NOTE — Progress Notes (Signed)
 Established Patient Office Visit  Subjective   Patient ID: [name] [name], male    DOB: [birth year]  Age: 58 y.o. MRN: [id number]  Chief Complaint  Patient presents with   Annual Exam    Pt reports he is fasted. He reports he has anxiety due to lay out of work, 2 wks ago    HPI    [name] [name] is seen today for physical exam.  He unfortunately just got laid off his job a week ago.  Currently looking for other employment opportunities.  His past medical history is significant for CAD with CABG several years ago, hypertension, ongoing nicotine  use, hyperlipidemia.  Medications reviewed.  Still smoking about a pack cigarettes per day.  No recent chest pains.  Compliant with regular medications.  Health maintenance reviewed  Health Maintenance  Topic Date Due   HIV Screening  Never done   Pneumococcal Vaccine: 50+ Years (1 of 2 - PCV) Never done   Influenza Vaccine  08/02/2023   COVID-19 Vaccine (2 - 2025-26 season) 10/23/2023 (Originally 09/02/2023)   Zoster Vaccines- Shingrix (1 of 2) 01/07/2024 (Originally 05/22/2014)   Hepatitis B Vaccines 19-59 Average Risk (1 of 3 - 19+ 3-dose series) 10/06/2024 (Originally 05/22/1983)   Colonoscopy  10/06/2024 (Originally 02/21/2020)   Hepatitis C Screening  10/06/2024 (Originally 05/22/1982)   DTaP/Tdap/Td (3 - Td or Tdap) 06/12/2028   HPV VACCINES  Aged Out   Meningococcal B Vaccine  Aged Out   - Does consent to flu vaccine and pneumonia vaccine today. - Overdue for colonoscopy but declines referral at this time.  He will consider getting this next year  Social history-married.  He has smoking history of about 35-pack-year history and currently smokes about 1 pack/day.  No regular alcohol.  Just laid off work last week.  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   Other and unspecified hyperlipidemia 12/29/2008   Past Surgical History:  Procedure Laterality Date   CARDIAC CATHETERIZATION N/A 08/11/2015   Procedure: Left Heart Cath and Coronary Angiography;  Surgeon: Lonni Hanson, MD;  Location: [facility];  Service: Cardiovascular;  Laterality: N/A;   COLONOSCOPY     CORONARY ARTERY BYPASS GRAFT N/A 08/17/2015   Procedure: CORONARY ARTERY BYPASS GRAFTING (CABG) times five, LIMA to LAD, SVG to Diagonal sequentially SVG to OM-PL and Left Radial Artery to PD;  Surgeon: Elspeth JAYSON Millers, MD;  Location: [facility] OR;  Service: Open Heart Surgery;  Laterality: N/A;   RADIAL ARTERY HARVEST Left 08/17/2015   Procedure: RADIAL ARTERY HARVEST;  Surgeon: Elspeth JAYSON Millers, MD;  Location: [facility] OR;  Service: Open Heart Surgery;  Laterality: Left;   TEE WITHOUT CARDIOVERSION N/A 08/17/2015   Procedure: TRANSESOPHAGEAL ECHOCARDIOGRAM (TEE);  Surgeon: Elspeth JAYSON Millers, MD;  Location: [facility] OR;  Service:  Open Heart Surgery;  Laterality: N/A;   WISDOM TOOTH EXTRACTION  1989    reports that he has been smoking cigarettes. He started smoking about 16 years ago. He has a 8 pack-year smoking history. He has never used smokeless tobacco. He reports current alcohol use of about 3.0 standard drinks of alcohol per week. He reports that he does not use drugs. family history includes Cancer in his mother; Diabetes in his brother; Heart disease in his paternal aunt and paternal uncle; Heart disease (age of onset: 89) in his father. No Known Allergies   Review of Systems  Constitutional:  Negative for chills, fever, malaise/fatigue and weight loss.   HENT:  Negative for hearing loss.   Eyes:  Negative for blurred vision and double vision.  Respiratory:  Negative for cough and shortness of breath.   Cardiovascular:  Negative for chest pain, palpitations and leg swelling.  Gastrointestinal:  Negative for abdominal pain, blood in stool, constipation and diarrhea.  Genitourinary:  Negative for dysuria.  Skin:  Negative for rash.  Neurological:  Negative for dizziness, speech change, seizures, loss of consciousness and headaches.  Psychiatric/Behavioral:  Negative for depression.       Objective:     BP (!) 140/80 (BP Location: Right Arm, Cuff Size: Normal)   Pulse 65   Temp 98.5 F (36.9 C) (Oral)   Ht 5' 10.08 (1.78 m)   Wt 199 lb 3.2 oz (90.4 kg)   SpO2 98%   BMI 28.52 kg/m  BP Readings from Last 3 Encounters:  10/07/23 (!) 140/80  10/29/22 (!) 142/80  08/18/21 136/80   Wt Readings from Last 3 Encounters:  10/07/23 199 lb 3.2 oz (90.4 kg)  10/29/22 197 lb 1.6 oz (89.4 kg)  08/18/21 202 lb 11.2 oz (91.9 kg)      Physical Exam Vitals reviewed.  Constitutional:      General: He is not in acute distress.    Appearance: He is well-developed. He is not ill-appearing.  HENT:     Head: Normocephalic and atraumatic.     Right Ear: External ear normal.     Left Ear: External ear normal.  Eyes:     Conjunctiva/sclera: Conjunctivae normal.     Pupils: Pupils are equal, round, and reactive to light.  Neck:     Thyroid : No thyromegaly.  Cardiovascular:     Rate and Rhythm: Normal rate and regular rhythm.     Heart sounds: Normal heart sounds. No murmur heard. Pulmonary:     Effort: No respiratory distress.     Breath sounds: No wheezing or rales.  Abdominal:     General: Bowel sounds are normal. There is no distension.     Palpations: Abdomen is soft. There is no mass.     Tenderness: There is no abdominal tenderness. There is no guarding or rebound.  Musculoskeletal:     Cervical back: Normal range of motion and  neck supple.     Right lower leg: No edema.     Left lower leg: No edema.  Lymphadenopathy:     Cervical: No cervical adenopathy.  Skin:    Findings: No rash.  Neurological:     Mental Status: He is alert and oriented to person, place, and time.     Cranial Nerves: No cranial nerve deficit.      No results found for any visits on 10/07/23.    The 10-year ASCVD risk score (Arnett DK, et al., 2019) is: 9.9%    Assessment &  Plan:   Here for annual physical exam.  He has chronic problems as above.  History of CAD.  Ongoing nicotine  use.  Discussed several health maintenance issues as follows  -Recommend flu vaccine and Prevnar 20 and he consents to both - Recommend Shingrix vaccine at some point this year - Needs follow-up colonoscopy.  He declines letting us  set up at this point but will consider at some point this year - Strongly encouraged to stop smoking - Obtain follow-up labs with lipid, CMP, PSA - Refill all regular medications for 1 year - Also suggested low-dose CT lung cancer screening which he will consider   No follow-ups on file.    Wolm Scarlet, MD

## 2023-10-07 NOTE — Patient Instructions (Signed)
 Consider Colonoscopy, low dose CT lung cancer screen, and Shingles vaccines this year.

## 2023-10-08 MED ORDER — EZETIMIBE 10 MG PO TABS: 10.0000 mg | ORAL_TABLET | Freq: Every day | ORAL | 0 refills | Status: DC

## 2023-12-05 ENCOUNTER — Other Ambulatory Visit: Payer: Self-pay | Admitting: Family Medicine

## 2024-01-11 ENCOUNTER — Other Ambulatory Visit: Payer: Self-pay | Admitting: Family Medicine
